# Patient Record
Sex: Male | Born: 1935 | Race: White | Hispanic: No | Marital: Married
Health system: Southern US, Community
[De-identification: ages and names within clinical notes are randomized; demographics above are authoritative.]

## PROBLEM LIST (undated history)

## (undated) DIAGNOSIS — N529 Male erectile dysfunction, unspecified: Secondary | ICD-10-CM

## (undated) DIAGNOSIS — K219 Gastro-esophageal reflux disease without esophagitis: Secondary | ICD-10-CM

## (undated) DIAGNOSIS — IMO0001 Reserved for inherently not codable concepts without codable children: Secondary | ICD-10-CM

## (undated) DIAGNOSIS — H409 Unspecified glaucoma: Secondary | ICD-10-CM

## (undated) DIAGNOSIS — R002 Palpitations: Secondary | ICD-10-CM

## (undated) DIAGNOSIS — R5383 Other fatigue: Secondary | ICD-10-CM

## (undated) DIAGNOSIS — I493 Ventricular premature depolarization: Secondary | ICD-10-CM

## (undated) DIAGNOSIS — J189 Pneumonia, unspecified organism: Secondary | ICD-10-CM

## (undated) DIAGNOSIS — R0789 Other chest pain: Secondary | ICD-10-CM

## (undated) DIAGNOSIS — R1310 Dysphagia, unspecified: Secondary | ICD-10-CM

## (undated) DIAGNOSIS — C07 Malignant neoplasm of parotid gland: Secondary | ICD-10-CM

## (undated) DIAGNOSIS — R152 Fecal urgency: Secondary | ICD-10-CM

## (undated) HISTORY — DX: Gastro-esophageal reflux disease without esophagitis: K21.9

## (undated) HISTORY — DX: Fecal urgency: R15.2

## (undated) HISTORY — DX: Male erectile dysfunction, unspecified: N52.9

## (undated) HISTORY — DX: Palpitations: R00.2

## (undated) HISTORY — DX: Other fatigue: R53.83

## (undated) HISTORY — PX: CARDIOVASCULAR STRESS TEST: SHX262

## (undated) HISTORY — DX: Other chest pain: R07.89

## (undated) HISTORY — DX: Ventricular premature depolarization: I49.3

## (undated) HISTORY — DX: Reserved for inherently not codable concepts without codable children: IMO0001

## (undated) HISTORY — PX: GLAUCOMA SURGERY: SHX656

---
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
## 1986-12-31 DIAGNOSIS — C07 Malignant neoplasm of parotid gland: Secondary | ICD-10-CM

## 1986-12-31 HISTORY — DX: Malignant neoplasm of parotid gland: C07

## 1986-12-31 HISTORY — PX: PAROTID GLAND TUMOR EXCISION: SHX5221

## 1997-12-31 HISTORY — PX: INGUINAL HERNIA REPAIR: SUR1180

## 1998-01-24 ENCOUNTER — Encounter: Payer: Self-pay | Admitting: Internal Medicine

## 2000-05-26 ENCOUNTER — Encounter: Payer: Self-pay | Admitting: Emergency Medicine

## 2000-05-26 ENCOUNTER — Emergency Department (HOSPITAL_COMMUNITY): Admission: EM | Admit: 2000-05-26 | Discharge: 2000-05-26 | Payer: Self-pay | Admitting: *Deleted

## 2000-05-26 ENCOUNTER — Encounter (INDEPENDENT_AMBULATORY_CARE_PROVIDER_SITE_OTHER): Payer: Self-pay | Admitting: *Deleted

## 2005-05-16 ENCOUNTER — Encounter: Admission: RE | Admit: 2005-05-16 | Discharge: 2005-05-16 | Payer: Self-pay | Admitting: Otolaryngology

## 2005-06-19 ENCOUNTER — Ambulatory Visit: Payer: Self-pay | Admitting: Internal Medicine

## 2005-06-28 ENCOUNTER — Encounter (INDEPENDENT_AMBULATORY_CARE_PROVIDER_SITE_OTHER): Payer: Self-pay | Admitting: Specialist

## 2005-06-28 ENCOUNTER — Ambulatory Visit: Payer: Self-pay | Admitting: Internal Medicine

## 2008-07-13 DIAGNOSIS — K649 Unspecified hemorrhoids: Secondary | ICD-10-CM | POA: Insufficient documentation

## 2008-07-13 DIAGNOSIS — D126 Benign neoplasm of colon, unspecified: Secondary | ICD-10-CM

## 2008-07-13 DIAGNOSIS — K5730 Diverticulosis of large intestine without perforation or abscess without bleeding: Secondary | ICD-10-CM | POA: Insufficient documentation

## 2008-07-14 ENCOUNTER — Ambulatory Visit: Payer: Self-pay | Admitting: Internal Medicine

## 2008-07-14 DIAGNOSIS — R1314 Dysphagia, pharyngoesophageal phase: Secondary | ICD-10-CM

## 2008-07-14 DIAGNOSIS — K219 Gastro-esophageal reflux disease without esophagitis: Secondary | ICD-10-CM

## 2008-07-14 DIAGNOSIS — R197 Diarrhea, unspecified: Secondary | ICD-10-CM | POA: Insufficient documentation

## 2008-08-05 ENCOUNTER — Telehealth: Payer: Self-pay | Admitting: Internal Medicine

## 2008-08-10 ENCOUNTER — Encounter: Payer: Self-pay | Admitting: Internal Medicine

## 2008-08-10 ENCOUNTER — Ambulatory Visit: Payer: Self-pay | Admitting: Internal Medicine

## 2008-08-13 ENCOUNTER — Encounter: Payer: Self-pay | Admitting: Internal Medicine

## 2008-09-02 ENCOUNTER — Ambulatory Visit: Payer: Self-pay | Admitting: Internal Medicine

## 2009-09-08 ENCOUNTER — Telehealth: Payer: Self-pay | Admitting: Internal Medicine

## 2009-09-26 ENCOUNTER — Ambulatory Visit: Payer: Self-pay | Admitting: Internal Medicine

## 2009-09-26 DIAGNOSIS — K644 Residual hemorrhoidal skin tags: Secondary | ICD-10-CM | POA: Insufficient documentation

## 2009-09-26 DIAGNOSIS — Z860100 Personal history of colon polyps, unspecified: Secondary | ICD-10-CM | POA: Insufficient documentation

## 2009-09-26 DIAGNOSIS — R159 Full incontinence of feces: Secondary | ICD-10-CM | POA: Insufficient documentation

## 2009-09-27 ENCOUNTER — Telehealth: Payer: Self-pay | Admitting: Internal Medicine

## 2010-01-20 HISTORY — PX: TRANSTHORACIC ECHOCARDIOGRAM: SHX275

## 2010-11-15 ENCOUNTER — Ambulatory Visit: Payer: Self-pay | Admitting: Cardiology

## 2011-02-21 ENCOUNTER — Ambulatory Visit (INDEPENDENT_AMBULATORY_CARE_PROVIDER_SITE_OTHER): Payer: Self-pay | Admitting: Cardiology

## 2011-02-21 DIAGNOSIS — K3189 Other diseases of stomach and duodenum: Secondary | ICD-10-CM

## 2011-02-21 DIAGNOSIS — I4949 Other premature depolarization: Secondary | ICD-10-CM

## 2011-02-21 DIAGNOSIS — R634 Abnormal weight loss: Secondary | ICD-10-CM

## 2011-05-09 ENCOUNTER — Telehealth: Payer: Self-pay | Admitting: Cardiology

## 2011-05-09 NOTE — Telephone Encounter (Signed)
Patient came into office with statement from 02/21/11, where insurance was not filed.  He had called to patient accounting and asked them to file insurance and was told they could not do this.  I went into [employer] and updated insurance and reposted.  Called to patient account and verified that patient insurance was updated.

## 2011-06-11 ENCOUNTER — Other Ambulatory Visit: Payer: Self-pay | Admitting: *Deleted

## 2011-06-11 DIAGNOSIS — I493 Ventricular premature depolarization: Secondary | ICD-10-CM

## 2011-06-11 MED ORDER — NEBIVOLOL HCL 10 MG PO TABS: 10.0000 mg | ORAL_TABLET | Freq: Every day | ORAL | Status: DC

## 2011-06-11 NOTE — Telephone Encounter (Signed)
Refilled meds per fax request.  

## 2011-06-20 ENCOUNTER — Encounter: Payer: Self-pay | Admitting: Cardiology

## 2011-06-25 ENCOUNTER — Encounter: Payer: Self-pay | Admitting: Cardiology

## 2011-06-25 ENCOUNTER — Ambulatory Visit (INDEPENDENT_AMBULATORY_CARE_PROVIDER_SITE_OTHER): Payer: Medicare Other | Admitting: Cardiology

## 2011-06-25 VITALS — BP 130/78 | HR 76 | Wt 167.0 lb

## 2011-06-25 DIAGNOSIS — I493 Ventricular premature depolarization: Secondary | ICD-10-CM | POA: Insufficient documentation

## 2011-06-25 DIAGNOSIS — I119 Hypertensive heart disease without heart failure: Secondary | ICD-10-CM

## 2011-06-25 DIAGNOSIS — I4949 Other premature depolarization: Secondary | ICD-10-CM

## 2011-06-25 NOTE — Progress Notes (Signed)
Ryan Lynn Date of Birth:  Sep 28, 1934 [facility] / [facility] [address]   [address] [place], [place]  [address] [phone]           Fax   [phone]  History of Present Illness: This pleasant 77 year old gentleman is seen for a scheduled 4 month followup office visit.  He has a history of palpitations and chest pressure.  He has had palpitations dating back 15 or 20 years.  We first saw him for this in February 2011.  He does not have any evidence of ischemic heart disease.  He had a normal treadmill Cardiolite stress test on 02/15/10 and there is no evidence of ischemia and his PVCs decreased with exercise.  He initially was given a trial of metoprolol which she did not improve on and eventually was switched to bystolic which she has tolerated very well.  The patient has a history of having had an echocardiogram on 01/20/10 which showed normal LV systolic function with an ejection fraction of 55-60%, impaired relaxation, and no significant valve abnormalities.  Pulmonary artery pressure was normal.  Current Outpatient Prescriptions  Medication Sig Dispense Refill  . calcium carbonate (TUMS - DOSED IN MG ELEMENTAL CALCIUM) 500 MG chewable tablet Chew 2 tablets by mouth daily.        . Cetirizine HCl (ZYRTEC PO) Take by mouth as needed.        . Cholecalciferol (VITAMIN D) 2000 UNITS tablet Take 2,000 Units by mouth daily.        . dorzolamide-timolol (COSOPT) 22.3-6.8 MG/ML ophthalmic solution 1 drop as directed.        . nebivolol (BYSTOLIC) 10 MG tablet Take 1 tablet (10 mg total) by mouth daily.  30 tablet  11  . travoprost, benzalkonium, (TRAVATAN) 0.004 % ophthalmic solution 1 drop as directed.        . vitamin C (ASCORBIC ACID) 500 MG tablet Take 500 mg by mouth daily.          Allergies  Allergen Reactions  . Nexium Diarrhea    Patient Active Problem List  Diagnoses  . COLONIC POLYPS  . HEMORRHOIDS-EXTERNAL  . HEMORRHOIDS  . GERD  . DIVERTICULOSIS, COLON   . DYSPHAGIA  . FECAL INCONTINENCE  . DIARRHEA  . PERSONAL HX COLONIC POLYPS  . PVC's (premature ventricular contractions)  . Benign hypertensive heart disease without heart failure    History  Smoking status  . Never Smoker   Smokeless tobacco  . Not on file    History  Alcohol Use No    Family History  Problem Relation Age of Onset  . Heart failure Mother   . Cancer Father     Review of Systems: Constitutional: no fever chills diaphoresis or fatigue or change in weight.  Head and neck: no hearing loss, no epistaxis, no photophobia or visual disturbance. Respiratory: No cough, shortness of breath or wheezing. Cardiovascular: No chest pain peripheral edema, palpitations. Gastrointestinal: No abdominal distention, no abdominal pain, no change in bowel habits hematochezia or melena.The patient does have a history of occasional Dysentery.  He is followed by Dr. Marina Goodell. Genitourinary: No dysuria, no frequency, no urgency, no nocturia. Musculoskeletal:No arthralgias, no back pain, no gait disturbance or myalgias. Neurological: No dizziness, no headaches, no numbness, no seizures, no syncope, no weakness, no tremors. Hematologic: No lymphadenopathy, no easy bruising. Psychiatric: No confusion, no hallucinations, no sleep disturbance.    Physical Exam: Filed Vitals:   06/25/11 1353  BP: 130/78  Pulse:  76  The general examination reveals a well-developed well-nourished gentleman in no distress.The head and neck exam reveals pupils equal and reactive.  Extraocular movements are full.  There is no scleral icterus.  The mouth and pharynx are normal.  The neck is supple.  The carotids reveal no bruits.  The jugular venous pressure is normal.  The  thyroid is not enlarged.  There is no lymphadenopathy.  The chest is clear to percussion and auscultation.  There are no rales or rhonchi.  Expansion of the chest is symmetrical.  The precordium is quiet.The patient is having occasional  PVCs.  The first heart sound is normal.  The second heart sound is physiologically split.  There is no murmur gallop rub or click.  There is no abnormal lift or heave.  The abdomen is soft and nontender.  The bowel sounds are normal.  The liver and spleen are not enlarged.  There are no abdominal masses.  There are no abdominal bruits.  Extremities reveal good pedal pulses.  There is no phlebitis or edema.  There is no cyanosis or clubbing.  Strength is normal and symmetrical in all extremities.  There is no lateralizing weakness.  There are no sensory deficits.  The skin is warm and dry.  There is no rash.  EKG today shows normal sinus rhythm with occasional unifocal PVCs.  The tracing is otherwise within normal limits.   Assessment / Plan: The patient is to continue his same medication.  Continue regular exercise.  Recheck in 6 months for followup office visit and EKG

## 2011-06-25 NOTE — Assessment & Plan Note (Signed)
This pleasant gentleman has a long history of palpitations and PVCs.  He has had a good response to Bystolic10 mg a day.  Although he is still having isolated PVCs, he is no longer bothered by the sensation of palpitations

## 2011-06-25 NOTE — Assessment & Plan Note (Signed)
The patient has a history of labile hypertension.  When we last saw him in February his blood pressure was running 144/90.  Since then he has been checking his pressures at home and they have been down in normal range.  He brought in an extensive list of his home blood pressures today dating from March 05, 2011 until the present.  He has not had any exertional chest pain or shortness of breath or dizziness or syncope.  His energy level is good

## 2011-06-26 ENCOUNTER — Encounter: Payer: Self-pay | Admitting: Cardiology

## 2011-06-27 ENCOUNTER — Other Ambulatory Visit: Payer: Self-pay | Admitting: Dermatology

## 2011-11-26 ENCOUNTER — Telehealth: Payer: Self-pay | Admitting: Internal Medicine

## 2011-11-26 DIAGNOSIS — R197 Diarrhea, unspecified: Secondary | ICD-10-CM

## 2011-11-26 NOTE — Telephone Encounter (Signed)
Pt aware. Will call [facility] in the am to see if rx can be prepared cheaper for pt. Call pt on cell at [phone].

## 2011-11-26 NOTE — Telephone Encounter (Signed)
If patient had previously been diagnosed with C. Diff, then start Vanco 250mg  qid x 4 weeks. Have him see me in the office in about 3-4 weeks

## 2011-11-26 NOTE — Telephone Encounter (Signed)
Last OV with Dr. Marina Goodell 09-26-09 pt states he was having problems with urgency and unpredictability with his bowels.EGD and Colon 08-10-08. Pt had a gum infection in July and was placed on Clindamycin. August 23rd he started having bad diarrhea and was diagnosed with cdiff. Pt was placed on 1st round of Flagyl August 27th by Dr. Timothy Lasso. Pt had his second round of Flagyl 09/14/11. Pt reports that the end of September he was having the problems with unpredictability and urgency again. Pt states this month he started having a lot of diarrhea again and he was placed on Vancomycin. Pt finished the vanc this weekend. He is having diarrhea again, called Dr. Timothy Lasso and he told the pt to call Dr. Lamar Sprinkles office. Pt states he has not been tested again this time for Cdiff. Dr. Marina Goodell please advise.

## 2011-11-27 NOTE — Telephone Encounter (Signed)
YES ASAP, AND PROCEED WITH TREATMENT IF +.  LINDA PLEASE FOLLOW UP ON THIS

## 2011-11-27 NOTE — Telephone Encounter (Signed)
Patient aware and will come today to the lab.

## 2011-11-27 NOTE — Telephone Encounter (Signed)
Pt called back this am and wanted to let us know he was only tested for cdiff in August. He has not be tested again. Pt would like to be tested again before starting the vanc again. Dr. Marina Goodell is it ok to bring him in for a stool specimen for d cdiff?

## 2011-11-28 ENCOUNTER — Other Ambulatory Visit: Payer: Medicare Other

## 2011-11-28 DIAGNOSIS — R197 Diarrhea, unspecified: Secondary | ICD-10-CM

## 2011-11-29 ENCOUNTER — Telehealth: Payer: Self-pay

## 2011-11-29 LAB — CLOSTRIDIUM DIFFICILE BY PCR: Toxigenic C. Difficile by PCR: NOT DETECTED

## 2011-11-29 NOTE — Telephone Encounter (Signed)
Message copied by HUNT, LINDA R. R on Thu Nov 29, 2011 10:52 AM ------      Message from: PERRY, JOHN N      Created: Thu Nov 29, 2011 10:16 AM       LET PT KNOW C. DIFF IS NEGATIVE. RECOMMEND FLORASTOR ONE BID X 2 WEEKS, LOW DOSE (1-4 DAILY) IMMODIUM DAILY AS NEEDED, AND CALL US IN ONE WEEK WITH UPDATE. 

## 2011-11-29 NOTE — Telephone Encounter (Signed)
Spoke with pt and he is aware. He will call next week with update.

## 2011-11-29 NOTE — Telephone Encounter (Signed)
I SENT YOU NOTE. THANKS

## 2011-11-29 NOTE — Telephone Encounter (Signed)
Pts cdiff test was negative. Should pt just be scheduled for OV for sense of urgency and unpredictability with bowel movements? Please advise.

## 2011-11-29 NOTE — Telephone Encounter (Signed)
Message copied by Michele Mcalpine on Thu Nov 29, 2011 10:52 AM ------      Message from: Hilarie Fredrickson      Created: Thu Nov 29, 2011 10:16 AM       LET PT KNOW C. DIFF IS NEGATIVE. RECOMMEND FLORASTOR ONE BID X 2 WEEKS, LOW DOSE (1-4 DAILY) IMMODIUM DAILY AS NEEDED, AND CALL us IN ONE WEEK WITH UPDATE.

## 2011-11-29 NOTE — Telephone Encounter (Signed)
Thanks

## 2011-12-06 ENCOUNTER — Telehealth: Payer: Self-pay | Admitting: Internal Medicine

## 2011-12-06 NOTE — Telephone Encounter (Signed)
At this point, he should just make a routine office appointment if he is having ongoing questions or problems

## 2011-12-06 NOTE — Telephone Encounter (Signed)
Mr. Garofano is calling back to update [place]. He has been taking the Florastor and has only taken 1 Immodium daily for 4 total doses. He states he has had no more diarrhea but his stools are still quite soft. He states he is still having the feeling of pressure at times like he needs to go to the bathroom but there is nothing there. Pt wonders if he should try more that one of the Immodium to see if the stool will become more firm. Dr. Marina Goodell please advise.

## 2011-12-06 NOTE — Telephone Encounter (Signed)
Pt aware and appt made for pt to see Dr. Marina Goodell 12/10/11@3 :45pm. Pt aware of appt date and time.

## 2011-12-10 ENCOUNTER — Ambulatory Visit (INDEPENDENT_AMBULATORY_CARE_PROVIDER_SITE_OTHER): Payer: Medicare Other | Admitting: Internal Medicine

## 2011-12-10 ENCOUNTER — Encounter: Payer: Self-pay | Admitting: Internal Medicine

## 2011-12-10 VITALS — BP 158/84 | HR 76 | Ht 73.0 in | Wt 166.0 lb

## 2011-12-10 DIAGNOSIS — A0472 Enterocolitis due to Clostridium difficile, not specified as recurrent: Secondary | ICD-10-CM

## 2011-12-10 DIAGNOSIS — Z860100 Personal history of colon polyps, unspecified: Secondary | ICD-10-CM

## 2011-12-10 DIAGNOSIS — K219 Gastro-esophageal reflux disease without esophagitis: Secondary | ICD-10-CM

## 2011-12-10 DIAGNOSIS — R159 Full incontinence of feces: Secondary | ICD-10-CM

## 2011-12-10 DIAGNOSIS — K589 Irritable bowel syndrome without diarrhea: Secondary | ICD-10-CM

## 2011-12-10 MED ORDER — CILIDINIUM-CHLORDIAZEPOXIDE 2.5-5 MG PO CAPS: ORAL_CAPSULE | ORAL | Status: DC

## 2011-12-10 NOTE — Patient Instructions (Signed)
We have sent the following medications to your pharmacy for you to pick up at your convenience:  Librax  

## 2011-12-10 NOTE — Progress Notes (Signed)
HISTORY OF PRESENT ILLNESS:  Ryan Lynn is a 77 y.o. 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 He was last evaluated in the office September 2010 regarding urgent bowel movements and hemorrhoidal symptoms. See that dictation. He was treated with medicated suppositories, sitz baths, and fiber. As well Librax. He tells me that Librax helped his abdominal complaints. He was able to wean off the medication. He was doing well until late August 2000 1220 developed a significant diarrheal illness after receiving clindamycin for dental work. He was diagnosed with Clostridium difficile associated diarrhea. He was treated with a course of metronidazole. Due to persisting symptoms he was treated with a second course of metronidazole. Eventually, in November, a course of vancomycin for recurrent symptoms. He finished the medication 2 weeks ago. Since this problem he has had some urgency. We did recheck Clostridium difficile for PCR which was negative. He contacted our office at the request of his primary provider. We placed him on Florastor for ongoing complaints of urgency, gas, and fecal seepage. His last colonoscopy was in August of 2009. This revealed diverticulosis. Otherwise normal examination including the ileum and random colon biopsies. Upper endoscopy at that time was negative except for an esophageal ring, fundic gland polyps, and a hiatal hernia.  REVIEW OF SYSTEMS:  All non-GI ROS negative.  Past Medical History  Diagnosis Date  . Hypertension   . PVC's (premature ventricular contractions)   . Hiatal hernia   . Rectal urgency   . Chest pressure   . Heart palpitations   . Aortic sclerosis   . ED (erectile dysfunction)   . Fatigue     Past Surgical History  Procedure Date  . Transthoracic echocardiogram 01/20/2010    NORMAL. EF 55-60%  .  Cardiovascular stress test     NO ISCHEMIA    Social History Ryan Lynn  reports that he has never smoked. He has never used smokeless tobacco. He reports that he does not drink alcohol or use illicit drugs.  family history includes Cancer in his father and Heart failure in his mother.  Allergies  Allergen Reactions  . Clindamycin/Lincomycin   . Nexium Diarrhea       PHYSICAL EXAMINATION:  Vital signs: BP 158/84  Pulse 76  Ht 6\' 1"  (1.854 m)  Wt 166 lb (75.297 kg)  BMI 21.90 kg/m2 General: Well-developed, well-nourished, no acute distress HEENT: Sclerae are anicteric, conjunctiva pink. Oral mucosa intact Lungs: Clear Heart: Regular Abdomen: soft, nontender, nondistended, no obvious ascites, no peritoneal signs, normal bowel sounds. No organomegaly. Extremities: No edema Psychiatric: alert and oriented x3. Cooperative     ASSESSMENT:  #1. Clostridium difficile associated diarrhea. Treated with 2 courses of metronidazole and one course of vancomycin. Seems to be asymptomatic with negative PCR testing for Clostridium difficile. Now on Florastor is recommended. #2. GERD. Asymptomatic off of PPIs #3. History of adenomatous colon polyps #4. Irritable bowel type picture with Fecal incontinence. Decreased rectal tone on physical exam.   PLAN:  #1. Continue Florastor #2. Librax one before meals and at night when necessary #3. Imodium when necessary #4. Local measures for hemorrhoids as previously reviewed #5. Surveillance colonoscopy around 2014 #6. GI followup when necessary

## 2011-12-26 ENCOUNTER — Encounter: Payer: Self-pay | Admitting: Cardiology

## 2011-12-26 ENCOUNTER — Ambulatory Visit (INDEPENDENT_AMBULATORY_CARE_PROVIDER_SITE_OTHER): Payer: Medicare Other | Admitting: Cardiology

## 2011-12-26 VITALS — BP 140/88 | HR 66 | Ht 73.0 in | Wt 165.8 lb

## 2011-12-26 DIAGNOSIS — I119 Hypertensive heart disease without heart failure: Secondary | ICD-10-CM

## 2011-12-26 DIAGNOSIS — R5383 Other fatigue: Secondary | ICD-10-CM

## 2011-12-26 DIAGNOSIS — I4949 Other premature depolarization: Secondary | ICD-10-CM

## 2011-12-26 DIAGNOSIS — I493 Ventricular premature depolarization: Secondary | ICD-10-CM

## 2011-12-26 NOTE — Patient Instructions (Signed)
Your physician wants you to follow-up in: 6 months  You will receive a reminder letter in the mail two months in advance. If you don't receive a letter, please call our office to schedule the follow-up appointment.  Your physician recommends that you continue on your current medications as directed. Please refer to the Current Medication list given to you today.  

## 2011-12-26 NOTE — Assessment & Plan Note (Signed)
The patient complains of some malaise and fatigue which he attributes to the medication.  He feels cold.  We will try cutting back on the bystolic to just half a tablet a day temporarily and see if symptoms improve.

## 2011-12-26 NOTE — Assessment & Plan Note (Signed)
Normally the patient exercises on a regular basis.  However over the past several months he has had problems with a infected tooth which led to antibiotic associated enterocolitis with C. difficile which was difficult to eradicate.  During that period of several months he got very little exercise.

## 2011-12-26 NOTE — Assessment & Plan Note (Signed)
The patient has not been as aware of his PVCs recently.  He is still having numbness shown by his rhythm strip to day.  He is not having any angina pectoris.  He said no TIA symptoms.

## 2011-12-26 NOTE — Progress Notes (Signed)
Ryan Lynn Date of Birth:  [birth year] [facility] / [facility] [address]   [address] [place], [place]  [address] [phone]           Fax   [phone]  History of Present Illness: This pleasant 77 year old gentleman is seen for followup office visit.  He has a past history of essential hypertension chest pressure and palpitations.  His palpitations dates back 15 or 20 years.  We first saw him in February 2011.  He does not have any evidence for ischemic heart disease.  He had a normal treadmill Cardiolite stress test on 02/15/10.  His PVCs which were numerous at rest decreased with exercise.  He was given an initial trial of metoprolol which he did not tolerate well and eventually was switched to bystolic when she has tolerated better.  However he does complain that it makes him feel current.  He presently is taking 10 mg daily.  Current Outpatient Prescriptions  Medication Sig Dispense Refill  . calcium carbonate (TUMS - DOSED IN MG ELEMENTAL CALCIUM) 500 MG chewable tablet Chew 2 tablets by mouth as needed.       . Cetirizine HCl (ZYRTEC PO) Take by mouth as needed.        . Cholecalciferol (VITAMIN D) 2000 UNITS tablet Take 4,000 Units by mouth daily.       . clidinium-chlordiazePOXIDE (LIBRAX) 2.5-5 MG per capsule Take one tablet before breakfast and at bedtime as needed  120 capsule  2  . dorzolamide-timolol (COSOPT) 22.3-6.8 MG/ML ophthalmic solution 1 drop as directed.        . hydrocortisone (ANUSOL-HC) 25 MG suppository Place 25 mg rectally 2 (two) times daily as needed.        . loperamide (IMODIUM) 2 MG capsule Take 2 mg by mouth 4 (four) times daily as needed.        . nebivolol (BYSTOLIC) 10 MG tablet Take 1 tablet (10 mg total) by mouth daily.  30 tablet  11  . simethicone (MYLICON) 125 MG chewable tablet Chew 125 mg by mouth every 6 (six) hours as needed.        . travoprost, benzalkonium, (TRAVATAN) 0.004 % ophthalmic solution 1 drop as directed.        .  vitamin C (ASCORBIC ACID) 500 MG tablet Take 1,000 mg by mouth daily.         Allergies  Allergen Reactions  . Clindamycin/Lincomycin   . Nexium Diarrhea    Patient Active Problem List  Diagnoses  . COLONIC POLYPS  . HEMORRHOIDS-EXTERNAL  . HEMORRHOIDS  . GERD  . DIVERTICULOSIS, COLON  . DYSPHAGIA  . FECAL INCONTINENCE  . DIARRHEA  . PERSONAL HX COLONIC POLYPS  . PVC's (premature ventricular contractions)  . Benign hypertensive heart disease without heart failure    History  Smoking status  . Never Smoker   Smokeless tobacco  . Never Used    History  Alcohol Use No    Family History  Problem Relation Age of Onset  . Heart failure Mother   . Cancer Father     Review of Systems: Constitutional: no fever chills diaphoresis or fatigue or change in weight.  Head and neck: no hearing loss, no epistaxis, no photophobia or visual disturbance. Respiratory: No cough, shortness of breath or wheezing. Cardiovascular: No chest pain peripheral edema, palpitations. Gastrointestinal: No abdominal distention, no abdominal pain, no change in bowel habits hematochezia or melena. Genitourinary: No dysuria, no frequency, no urgency, no nocturia. Musculoskeletal:No  arthralgias, no back pain, no gait disturbance or myalgias. Neurological: No dizziness, no headaches, no numbness, no seizures, no syncope, no weakness, no tremors. Hematologic: No lymphadenopathy, no easy bruising. Psychiatric: No confusion, no hallucinations, no sleep disturbance.    Physical Exam: Filed Vitals:   12/26/11 1323  BP: 140/88  Pulse: 66   the general appearance reveals a well-developed well-nourished gentleman in no distress.The head and neck exam reveals pupils equal and reactive.  Extraocular movements are full.  There is no scleral icterus.  The mouth and pharynx are normal.  The neck is supple.  The carotids reveal no bruits.  The jugular venous pressure is normal.  The  thyroid is not enlarged.   There is no lymphadenopathy.  The chest is clear to percussion and auscultation.  There are no rales or rhonchi.  Expansion of the chest is symmetrical.  The precordium is quiet.  The first heart sound is normal.  The second heart sound is physiologically split.  There is no murmur gallop rub or click.  There is no abnormal lift or heave.  The abdomen is soft and nontender.  The bowel sounds are normal.  The liver and spleen are not enlarged.  There are no abdominal masses.  There are no abdominal bruits.  Extremities reveal good pedal pulses.  There is no phlebitis or edema.  There is no cyanosis or clubbing.  Strength is normal and symmetrical in all extremities.  There is no lateralizing weakness.  There are no sensory deficits.  The skin is warm and dry.  There is no rash.  EKG shows normal sinus rhythm with occasional unifocal PVC   Assessment / Plan: His blood pressure at home is significantly lower than it is in the office consistent with whitecoat syndrome.  We will try cutting back on his Bystolic to just half a tablet a day and see if we can get by with the lower dose.  If not he will resume his previous dose of 10 mg daily.  Recheck in 6 months for followup office visit and EKG.

## 2012-01-02 ENCOUNTER — Other Ambulatory Visit: Payer: Self-pay | Admitting: Dermatology

## 2012-02-26 ENCOUNTER — Other Ambulatory Visit: Payer: Self-pay | Admitting: Dermatology

## 2012-05-29 ENCOUNTER — Other Ambulatory Visit: Payer: Self-pay | Admitting: Dermatology

## 2012-07-23 ENCOUNTER — Encounter: Payer: Self-pay | Admitting: Cardiology

## 2012-07-23 ENCOUNTER — Ambulatory Visit (INDEPENDENT_AMBULATORY_CARE_PROVIDER_SITE_OTHER): Payer: Medicare Other | Admitting: Cardiology

## 2012-07-23 VITALS — BP 130/80 | HR 66 | Ht 73.0 in | Wt 161.8 lb

## 2012-07-23 DIAGNOSIS — I493 Ventricular premature depolarization: Secondary | ICD-10-CM

## 2012-07-23 DIAGNOSIS — I119 Hypertensive heart disease without heart failure: Secondary | ICD-10-CM

## 2012-07-23 DIAGNOSIS — I4949 Other premature depolarization: Secondary | ICD-10-CM

## 2012-07-23 DIAGNOSIS — K117 Disturbances of salivary secretion: Secondary | ICD-10-CM | POA: Insufficient documentation

## 2012-07-23 MED ORDER — NEBIVOLOL HCL 10 MG PO TABS: ORAL_TABLET | ORAL | Status: DC

## 2012-07-23 NOTE — Assessment & Plan Note (Signed)
His PVCs have responded to treatment with Bystolic.  10 mg caused too much malaise and fatigue but he is doing better on 5 mg daily.

## 2012-07-23 NOTE — Patient Instructions (Addendum)
Your physician recommends that you continue on your current medications as directed. Please refer to the Current Medication list given to you today.  Your physician wants you to follow-up in: 6 months. You will receive a reminder letter in the mail two months in advance. If you don't receive a letter, please call our office to schedule the follow-up appointment.  

## 2012-07-23 NOTE — Progress Notes (Signed)
Ryan Lynn Date of Birth:  22-Sep-1934 [facility] [address] [place], [place]  [address] [phone]         Fax   [phone]  History of Present Illness: This pleasant 78 year old gentleman is seen for a six-month followup office visit.  The patient has a past history of symptomatic palpitations.  He's also had a past history of atypical chest pain and a history of essential hypertension.  He has had palpitations for about 20 years.  We first saw him in February 2011.  He had a normal treadmill Cardiolite stress test in February 2011 and he does not have any evidence of ischemic heart disease.  He has PVCs were initially difficult to control.  Initially we tried metoprolol which did not tolerate and we switched him to [facility] which has been more effective for him.  Other problems that he has includes xerostomia and lack of adequate saliva and he also has been diagnosed with vocal cord atrophy causing hoarseness.  Dr. Narda Bonds is is ENT physician.  Current Outpatient Prescriptions  Medication Sig Dispense Refill  . calcium carbonate (TUMS - DOSED IN MG ELEMENTAL CALCIUM) 500 MG chewable tablet Chew 2 tablets by mouth as needed.       . Cetirizine HCl (ZYRTEC PO) Take by mouth as needed.        . Cholecalciferol (VITAMIN D) 2000 UNITS tablet Take 4,000 Units by mouth daily.       . clidinium-chlordiazePOXIDE (LIBRAX) 2.5-5 MG per capsule Take one tablet before breakfast and at bedtime as needed  120 capsule  2  . dorzolamide-timolol (COSOPT) 22.3-6.8 MG/ML ophthalmic solution Place 1 drop into both eyes 2 (two) times daily.      Marland Kitchen loperamide (IMODIUM) 2 MG capsule Take 2 mg by mouth 4 (four) times daily as needed.        . simethicone (MYLICON) 125 MG chewable tablet Chew 125 mg by mouth every 6 (six) hours as needed.        . travoprost, benzalkonium, (TRAVATAN) 0.004 % ophthalmic solution 1 drop as directed.        . vitamin C (ASCORBIC ACID) 500 MG tablet Take  1,000 mg by mouth daily.       . dorzolamide (TRUSOPT) 2 % ophthalmic solution as directed.      . nebivolol (BYSTOLIC) 10 MG tablet Take 10 mg by mouth as directed. 1/2 tablet daily      . nebivolol (BYSTOLIC) 10 MG tablet 1/2 daily or as directed  60 tablet  5  . DISCONTD: nebivolol (BYSTOLIC) 10 MG tablet Take 1 tablet (10 mg total) by mouth daily.  30 tablet  11    Allergies  Allergen Reactions  . Clindamycin/Lincomycin   . Esomeprazole Magnesium Diarrhea    Patient Active Problem List  Diagnosis  . COLONIC POLYPS  . HEMORRHOIDS-EXTERNAL  . HEMORRHOIDS  . GERD  . DIVERTICULOSIS, COLON  . DYSPHAGIA  . FECAL INCONTINENCE  . DIARRHEA  . PERSONAL HX COLONIC POLYPS  . PVC's (premature ventricular contractions)  . Benign hypertensive heart disease without heart failure  . Fatigue    History  Smoking status  . Never Smoker   Smokeless tobacco  . Never Used    History  Alcohol Use No    Family History  Problem Relation Age of Onset  . Heart failure Mother   . Cancer Father     Review of Systems: Constitutional: no fever chills diaphoresis or fatigue or change  in weight.  Head and neck: no hearing loss, no epistaxis, no photophobia or visual disturbance. Respiratory: No cough, shortness of breath or wheezing. Cardiovascular: No chest pain peripheral edema, palpitations. Gastrointestinal: No abdominal distention, no abdominal pain, no change in bowel habits hematochezia or melena. Genitourinary: No dysuria, no frequency, no urgency, no nocturia. Musculoskeletal:No arthralgias, no back pain, no gait disturbance or myalgias. Neurological: No dizziness, no headaches, no numbness, no seizures, no syncope, no weakness, no tremors. Hematologic: No lymphadenopathy, no easy bruising. Psychiatric: No confusion, no hallucinations, no sleep disturbance.    Physical Exam: Filed Vitals:   07/23/12 1026  BP: 130/80  Pulse: 66   the general appearance reveals a  well-developed somewhat anxious elderly gentleman in no distress.The head and neck exam reveals pupils equal and reactive.  Extraocular movements are full.  There is no scleral icterus.  The mouth and pharynx are normal.  The neck is supple.  The carotids reveal no bruits.  The jugular venous pressure is normal.  The  thyroid is not enlarged.  There is no lymphadenopathy.  The chest is clear to percussion and auscultation.  There are no rales or rhonchi.  Expansion of the chest is symmetrical.  The precordium is quiet.  The first heart sound is normal.  The second heart sound is physiologically split.  There is no murmur gallop rub or click.  There is no abnormal lift or heave.  The abdomen is soft and nontender.  The bowel sounds are normal.  The liver and spleen are not enlarged.  There are no abdominal masses.  There are no abdominal bruits.  Extremities reveal good pedal pulses.  There is no phlebitis or edema.  There is no cyanosis or clubbing.  Strength is normal and symmetrical in all extremities.  There is no lateralizing weakness.  There are no sensory deficits.  The skin is warm and dry.  There is no rash.  EKG today shows normal sinus rhythm and no PVCs and is within normal limits.   Assessment / Plan: Continue present medication.  Continue regular exercise.  Use extra dietary salt if blood pressure gets too low and he is symptomatic.  Recheck 6 months for followup office visit and EKG

## 2012-07-23 NOTE — Assessment & Plan Note (Signed)
The patient has xerostomia.  Dr. Ezzard Standing had suggested that he try Evoxac.  However the medication has a lot of cardiovascular side effects including interaction with beta blockers causing AV block and the patient did not take the new medication.  I told him that he was right in not taking the medication because of the potential for side effects

## 2012-07-23 NOTE — Assessment & Plan Note (Signed)
He brought in a list of his home blood pressures.  Generally they are normal but occasionally they are low.  If his systolic pressure drops below 409 he feels tired and weak.  He talked about the fact that on the days that his blood pressure is low he can eat a little more dietary salt to raise his blood pressure back to the normal range

## 2012-07-25 ENCOUNTER — Telehealth: Payer: Self-pay | Admitting: Cardiology

## 2012-07-25 DIAGNOSIS — I493 Ventricular premature depolarization: Secondary | ICD-10-CM

## 2012-07-25 MED ORDER — NEBIVOLOL HCL 5 MG PO TABS: 5.0000 mg | ORAL_TABLET | Freq: Every day | ORAL | Status: DC

## 2012-07-25 NOTE — Telephone Encounter (Signed)
Please return call to patient at [phone], concerning bystolic medication discussion.

## 2012-07-25 NOTE — Telephone Encounter (Signed)
Patient wanted to change to Bystolic 5 mg tablets instead of 10 mg tabs

## 2012-08-12 ENCOUNTER — Telehealth: Payer: Self-pay

## 2012-08-12 NOTE — Telephone Encounter (Signed)
Adding an EGD for Hemoccult-positive stool and GERD would be reasonable. Okay to do. Thanks

## 2012-08-12 NOTE — Telephone Encounter (Signed)
EGD added to the schedule and pt aware.

## 2012-08-12 NOTE — Telephone Encounter (Signed)
Message copied by Chrystie Nose on Tue Aug 12, 2012  3:50 PM ------      Message from: Hilarie Fredrickson      Created: Tue Aug 12, 2012  3:18 PM      Regarding: heme positive stool       Bonita Quin, Dr. Timothy Lasso sent me a lab result with Hemoccult-positive stool. The patient has a history of adenomatous colon polyps. His last colonoscopy was in August of 2009. Routine followup in 5 years recommended. However, in light of the Hemoccult-positive stool, set him up for surveillance colonoscopy at this time. Please contact the patient and let him know. Convert this to phone note for the records. Thanks. Dr. Marina Goodell

## 2012-08-12 NOTE — Telephone Encounter (Signed)
Pt scheduled for previsit 09/04/12@3 :30pm, colon scheduled for 09/09/12@3pm . Pt aware of appt dates and times.  Pt wanted to know if he should have an EGD also, last one done in 2009. States he has a history of reflux. Also states that he has vocal cord atrophy and sees Dr. Ovidio Kin for this, wants to make sure it would be ok for the EGD. Please advise.

## 2012-09-09 ENCOUNTER — Encounter: Payer: Medicare Other | Admitting: Internal Medicine

## 2012-10-27 ENCOUNTER — Ambulatory Visit (AMBULATORY_SURGERY_CENTER): Payer: Medicare Other | Admitting: *Deleted

## 2012-10-27 VITALS — Ht 72.0 in | Wt 163.0 lb

## 2012-10-27 DIAGNOSIS — K222 Esophageal obstruction: Secondary | ICD-10-CM

## 2012-10-27 DIAGNOSIS — Z1211 Encounter for screening for malignant neoplasm of colon: Secondary | ICD-10-CM

## 2012-10-27 DIAGNOSIS — K921 Melena: Secondary | ICD-10-CM

## 2012-10-27 DIAGNOSIS — K219 Gastro-esophageal reflux disease without esophagitis: Secondary | ICD-10-CM

## 2012-10-27 MED ORDER — MOVIPREP 100 G PO SOLR: ORAL | Status: DC

## 2012-11-07 ENCOUNTER — Encounter: Payer: Self-pay | Admitting: Internal Medicine

## 2012-11-07 ENCOUNTER — Ambulatory Visit (AMBULATORY_SURGERY_CENTER): Payer: Medicare Other | Admitting: Internal Medicine

## 2012-11-07 VITALS — BP 142/79 | HR 68 | Temp 97.6°F | Resp 20 | Ht 72.0 in | Wt 163.0 lb

## 2012-11-07 DIAGNOSIS — K222 Esophageal obstruction: Secondary | ICD-10-CM

## 2012-11-07 DIAGNOSIS — Z1211 Encounter for screening for malignant neoplasm of colon: Secondary | ICD-10-CM

## 2012-11-07 DIAGNOSIS — R195 Other fecal abnormalities: Secondary | ICD-10-CM

## 2012-11-07 DIAGNOSIS — Z86010 Personal history of colonic polyps: Secondary | ICD-10-CM

## 2012-11-07 DIAGNOSIS — K219 Gastro-esophageal reflux disease without esophagitis: Secondary | ICD-10-CM

## 2012-11-07 DIAGNOSIS — R1310 Dysphagia, unspecified: Secondary | ICD-10-CM

## 2012-11-07 DIAGNOSIS — K921 Melena: Secondary | ICD-10-CM

## 2012-11-07 MED ORDER — SODIUM CHLORIDE 0.9 % IV SOLN: 500.0000 mL | INTRAVENOUS | Status: DC

## 2012-11-07 NOTE — Patient Instructions (Addendum)
YOU HAD AN ENDOSCOPIC PROCEDURE TODAY AT THE [facility]: Refer to the procedure report that was given to you for any specific questions about what was found during the examination.  If the procedure report does not answer your questions, please call your gastroenterologist to clarify.  If you requested that your care partner not be given the details of your procedure findings, then the procedure report has been included in a sealed envelope for you to review at your convenience later.  YOU SHOULD EXPECT: Some feelings of bloating in the abdomen. Passage of more gas than usual.  Walking can help get rid of the air that was put into your GI tract during the procedure and reduce the bloating. If you had a lower endoscopy (such as a colonoscopy or flexible sigmoidoscopy) you may notice spotting of blood in your stool or on the toilet paper. If you underwent a bowel prep for your procedure, then you may not have a normal bowel movement for a few days.  DIET: DILATION DIET FOLLOW HAND OUT.  ACTIVITY: Your care partner should take you home directly after the procedure.  You should plan to take it easy, moving slowly for the rest of the day.  You can resume normal activity the day after the procedure however you should NOT DRIVE or use heavy machinery for 24 hours (because of the sedation medicines used during the test).    SYMPTOMS TO REPORT IMMEDIATELY: A gastroenterologist can be reached at any hour.  During normal business hours, 8:30 AM to 5:00 PM Monday through Friday, call [phone].  After hours and on weekends, please call the GI answering service at [phone] who will take a message and have the physician on call contact you.   Following lower endoscopy (colonoscopy or flexible sigmoidoscopy):  Excessive amounts of blood in the stool  Significant tenderness or worsening of abdominal pains  Swelling of the abdomen that is new, acute  Fever of 100F or higher  Following  upper endoscopy (EGD)  Vomiting of blood or coffee ground material  New chest pain or pain under the shoulder blades  Painful or persistently difficult swallowing  New shortness of breath  Fever of 100F or higher  Black, tarry-looking stools  FOLLOW UP: If any biopsies were taken you will be contacted by phone or by letter within the next 1-3 weeks.  Call your gastroenterologist if you have not heard about the biopsies in 3 weeks.  Our staff will call the home number listed on your records the next business day following your procedure to check on you and address any questions or concerns that you may have at that time regarding the information given to you following your procedure. This is a courtesy call and so if there is no answer at the home number and we have not heard from you through the emergency physician on call, we will assume that you have returned to your regular daily activities without incident.  SIGNATURES/CONFIDENTIALITY: You and/or your care partner have signed paperwork which will be entered into your electronic medical record.  These signatures attest to the fact that that the information above on your After Visit Summary has been reviewed and is understood.  Full responsibility of the confidentiality of this discharge information lies with you and/or your care-partner.   Resume medications. Information given on diverticulosis,high fiber diet,esophagitis and dilation diet with discharge instructions.

## 2012-11-07 NOTE — Progress Notes (Signed)
Patient did not experience any of the following events: a burn prior to discharge; a fall within the facility; wrong site/side/patient/procedure/implant event; or a hospital transfer or hospital admission upon discharge from the facility. (G8907) Patient did not have preoperative order for IV antibiotic SSI prophylaxis. (G8918)  

## 2012-11-07 NOTE — Op Note (Signed)
[facility] [address] [place], [address]   COLONOSCOPY PROCEDURE REPORT  PATIENT: Ryan Lynn, Ryan Lynn  MR#: [id number] BIRTHDATE: 07-Dec-1934 , 78  yrs. old GENDER: Male ENDOSCOPIST: Roxy Cedar, MD REFERRED LK:GMWN Timothy Lasso, M.D. PROCEDURE DATE:  11/07/2012 PROCEDURE:   Colonoscopy, surveillance ASA CLASS:   Class II INDICATIONS:patient's personal history of adenomatous colon polyps (2004 w/ TA; 2007 HPP) and heme-positive stool. MEDICATIONS: MAC sedation, administered by CRNA and propofol (Diprivan) 200mg  IV  DESCRIPTION OF PROCEDURE:   After the risks benefits and alternatives of the procedure were thoroughly explained, informed consent was obtained.  A digital rectal exam revealed no abnormalities of the rectum.   The LB CF-H180AL [id number]  endoscope was introduced through the anus and advanced to the cecum, which was identified by both the appendix and ileocecal valve. No adverse events experienced.   The quality of the prep was excellent, using MoviPrep  The instrument was then slowly withdrawn as the colon was fully examined.      COLON FINDINGS: Moderate diverticulosis was noted The finding was in the left colon.   The colon mucosa was otherwise normal. Retroflexed views revealed internal hemorrhoids. The time to cecum=3 minutes 13 seconds.  Withdrawal time=10 minutes 0 seconds. The scope was withdrawn and the procedure completed. COMPLICATIONS: There were no complications.  ENDOSCOPIC IMPRESSION: 1.   Moderate diverticulosis was noted in the left colon 2.   The colon mucosa was otherwise normal 3.   No polyps  RECOMMENDATIONS: 1.  Return to the care of your primary provider.  GI follow up as needed 2.  Upper endoscopy today (see report)   eSigned:  Roxy Cedar, MD 11/07/2012 11:41 AM   cc: Creola Corn, MD and The Patient   PATIENT NAME:  [name], [name] MR#: [id number]

## 2012-11-07 NOTE — Op Note (Signed)
[facility] [address] [place], [address]   ENDOSCOPY PROCEDURE REPORT  PATIENT: Ryan Lynn, Ryan Lynn  MR#: [id number] BIRTHDATE: June 18, 1934 , 78  yrs. old GENDER: Male ENDOSCOPIST: Roxy Cedar, MD REFERRED BY:  Creola Corn, M.D. PROCEDURE DATE:  11/07/2012 PROCEDURE:  EGD, diagnostic Maloney dilation of esophagus - 12 F ASA CLASS:     Class II INDICATIONS:  heme positive stool.   history of esophageal reflux. dysphagia. MEDICATIONS: MAC sedation, administered by CRNA and propofol (Diprivan) 50mg  IV TOPICAL ANESTHETIC: Cetacaine Spray  DESCRIPTION OF PROCEDURE: After the risks benefits and alternatives of the procedure were thoroughly explained, informed consent was obtained.  The LB GIF-H180 [id number] endoscope was introduced through the mouth and advanced to the second portion of the duodenum. Without limitations.  The instrument was slowly withdrawn as the mucosa was fully examined.      The proximal and midesophagus were normal.  the distal esophagus revealed a large caliber ring like stricture with associated mild esophagitis.  The stomach was normal as was the duodenal bulb and post bulbar duodenum.   Retroflex view revealed a small hiatal hernia  THERAPY : 54 French Maloney dilator was passed blindly into the esophagus without resistance or heme.  Tolerated well.  Re     The scope was then withdrawn from the patient and the procedure completed.  COMPLICATIONS: There were no complications.  ENDOSCOPIC IMPRESSION: 1. mild esophagitis 2. esophageal stricture s/p dilation 3. otherwise normal exam.  THERAPY : 45 French Maloney dilator was passed blindly into the esophagus without resistance or heme.  Tolerated well  RECOMMENDATIONS: 1.  Clear liquids until 2pm , then soft foods rest of day.  Resume prior diet tomorrow. 2.   Prilosec 20 mg daily REPEAT EXAM:    eSigned:  Roxy Cedar, MD 11/07/2012 11:57 AMrevisCC:Vasilis Luhman Timothy Lasso, MD and The  Patient

## 2012-11-10 ENCOUNTER — Telehealth: Payer: Self-pay | Admitting: *Deleted

## 2012-11-10 NOTE — Telephone Encounter (Signed)
  Follow up Call-  Call back number 11/07/2012  Post procedure Call Back phone  # [phone]  Permission to leave phone message Yes     Patient questions:  Do you have a fever, pain , or abdominal swelling? no Pain Score  0 *  Have you tolerated food without any problems? yes  Have you been able to return to your normal activities? yes  Do you have any questions about your discharge instructions: Diet   no Medications  no Follow up visit  no  Do you have questions or concerns about your Care? yes  Actions: * If pain score is 4 or above: No action needed, pain <4.     Patient states that he is "sucking air into his esophagus which causes him to burp."  He states that he was stretched Friday.         He is eating and in no pain.  Breathing fine.   States he "just wanted you to know."

## 2012-12-05 ENCOUNTER — Other Ambulatory Visit: Payer: Self-pay | Admitting: Dermatology

## 2013-02-18 ENCOUNTER — Ambulatory Visit (INDEPENDENT_AMBULATORY_CARE_PROVIDER_SITE_OTHER): Payer: Medicare Other | Admitting: Cardiology

## 2013-02-18 ENCOUNTER — Encounter: Payer: Self-pay | Admitting: Cardiology

## 2013-02-18 VITALS — BP 142/78 | HR 74 | Ht 72.0 in | Wt 166.4 lb

## 2013-02-18 DIAGNOSIS — R197 Diarrhea, unspecified: Secondary | ICD-10-CM

## 2013-02-18 DIAGNOSIS — I119 Hypertensive heart disease without heart failure: Secondary | ICD-10-CM

## 2013-02-18 DIAGNOSIS — I493 Ventricular premature depolarization: Secondary | ICD-10-CM

## 2013-02-18 MED ORDER — NEBIVOLOL HCL 5 MG PO TABS: 5.0000 mg | ORAL_TABLET | Freq: Every day | ORAL | Status: DC

## 2013-02-18 NOTE — Patient Instructions (Signed)
Your physician recommends that you continue on your current medications as directed. Please refer to the Current Medication list given to you today.  Your physician wants you to follow-up in: 6 months. You will receive a reminder letter in the mail two months in advance. If you don't receive a letter, please call our office to schedule the follow-up appointment.  

## 2013-02-18 NOTE — Assessment & Plan Note (Signed)
The patient's PVCs have been under good control on his current dose of Bystolic 5 mg daily.

## 2013-02-18 NOTE — Assessment & Plan Note (Signed)
The patient is recovering from a recent bout of diarrhea caused by taking Ceftinir for bronchitis.  It caused his stools to turn black also.

## 2013-02-18 NOTE — Progress Notes (Signed)
Ryan Lynn Date of Birth:  [birth year] [facility] [address] [place], [place]  [address] [phone]         Fax   [phone]  History of Present Illness: This pleasant 79 year old gentleman is seen for a six-month followup office visit. The patient has a past history of symptomatic palpitations. He's also had a past history of atypical chest pain and a history of essential hypertension. He has had palpitations for about 20 years. We first saw him in February 2011. He had a normal treadmill Cardiolite stress test in February 2011 and he does not have any evidence of ischemic heart disease. He has PVCs were initially difficult to control. Initially we tried metoprolol which did not tolerate and we switched him to [facility] which has been more effective for him. Other problems that he has includes xerostomia and lack of adequate saliva and he also has been diagnosed with vocal cord atrophy causing hoarseness. Dr. Narda Bonds is is ENT physician.  At his last visit a week he was considering using Evoxac for his xerostomia but because of its potential cardiac side effects including heart block we felt that it would be best not to use it.   Current Outpatient Prescriptions  Medication Sig Dispense Refill  . Cholecalciferol (VITAMIN D) 2000 UNITS tablet Take 4,000 Units by mouth daily.       . dorzolamide (TRUSOPT) 2 % ophthalmic solution as directed.      . fluticasone (FLONASE) 50 MCG/ACT nasal spray Place 2 sprays into the nose at bedtime.      Marland Kitchen loperamide (IMODIUM) 2 MG capsule Take 2 mg by mouth 4 (four) times daily as needed.        . loratadine (CLARITIN) 10 MG tablet Take 10 mg by mouth as needed for allergies.      Marland Kitchen nebivolol (BYSTOLIC) 5 MG tablet Take 1 tablet (5 mg total) by mouth daily.  90 tablet  3  . omeprazole (PRILOSEC) 20 MG capsule Take 20 mg by mouth daily.      . simethicone (MYLICON) 125 MG chewable tablet Chew 125 mg by mouth as needed.       .  travoprost, benzalkonium, (TRAVATAN) 0.004 % ophthalmic solution 1 drop as directed.        . vitamin C (ASCORBIC ACID) 500 MG tablet Take 1,000 mg by mouth daily.        No current facility-administered medications for this visit.    Allergies  Allergen Reactions  . Cefdinir   . Clindamycin/Lincomycin Diarrhea and Other (See Comments)    c diff  . Esomeprazole Magnesium Diarrhea    Patient Active Problem List  Diagnosis  . COLONIC POLYPS  . HEMORRHOIDS-EXTERNAL  . HEMORRHOIDS  . GERD  . DIVERTICULOSIS, COLON  . DYSPHAGIA  . FECAL INCONTINENCE  . DIARRHEA  . PERSONAL HX COLONIC POLYPS  . PVC's (premature ventricular contractions)  . Benign hypertensive heart disease without heart failure  . Fatigue  . Xerostomia    History  Smoking status  . Never Smoker   Smokeless tobacco  . Never Used    History  Alcohol Use No    Family History  Problem Relation Age of Onset  . Heart failure Mother   . Cancer Father     Review of Systems: Constitutional: no fever chills diaphoresis or fatigue or change in weight.  Head and neck: no hearing loss, no epistaxis, no photophobia or visual disturbance. Respiratory: No cough, shortness of breath  or wheezing. Cardiovascular: No chest pain peripheral edema, palpitations. Gastrointestinal: No abdominal distention, no abdominal pain, no change in bowel habits hematochezia or melena. Genitourinary: No dysuria, no frequency, no urgency, no nocturia. Musculoskeletal:No arthralgias, no back pain, no gait disturbance or myalgias. Neurological: No dizziness, no headaches, no numbness, no seizures, no syncope, no weakness, no tremors. Hematologic: No lymphadenopathy, no easy bruising. Psychiatric: No confusion, no hallucinations, no sleep disturbance.    Physical Exam: Filed Vitals:   02/18/13 1401  BP: 142/78  Pulse: 74   the general appearance reveals an elderly gentleman in no distress.The head and neck exam reveals pupils  equal and reactive.  Extraocular movements are full.  There is no scleral icterus.  The mouth and pharynx are normal.  The neck is supple.  The carotids reveal no bruits.  The jugular venous pressure is normal.  The  thyroid is not enlarged.  There is no lymphadenopathy.  The chest is clear to percussion and auscultation.  There are no rales or rhonchi.  Expansion of the chest is symmetrical.  The precordium is quiet.  The first heart sound is normal.  The second heart sound is physiologically split.  There is no murmur gallop rub or click.  There is no abnormal lift or heave.  The abdomen is soft and nontender.  The bowel sounds are normal.  The liver and spleen are not enlarged.  There are no abdominal masses.  There are no abdominal bruits.  Extremities reveal good pedal pulses.  There is no phlebitis or edema.  There is no cyanosis or clubbing.  Strength is normal and symmetrical in all extremities.  There is no lateralizing weakness.  There are no sensory deficits.  The skin is warm and dry.  There is no rash.   EKG shows normal sinus rhythm and is within normal limits.  There are no PVCs  Assessment / Plan: Continue same medication. We gave him a new prescription for the 5 mg Bystolic pills which he takes once a day. Recheck in 6 months for followup office visit and EKG.  Continue regular walking exercise.

## 2013-02-18 NOTE — Assessment & Plan Note (Signed)
The patient brought in a list of representative blood pressures from home.  His blood pressure is labile but generally within acceptable limits.

## 2013-05-14 ENCOUNTER — Telehealth: Payer: Self-pay | Admitting: Cardiology

## 2013-05-14 DIAGNOSIS — I493 Ventricular premature depolarization: Secondary | ICD-10-CM

## 2013-05-14 MED ORDER — NEBIVOLOL HCL 2.5 MG PO TABS: 2.5000 mg | ORAL_TABLET | Freq: Every day | ORAL | Status: DC

## 2013-05-14 NOTE — Telephone Encounter (Signed)
Was using Timolol 0.5% eye drops but has been off for a while. His blood pressure has been running low and has no energy, fatigue. Patient has been taking his blood pressure and it has been running low.   Blood pressure readings yesterday:  8:00 am 133/75 HR 70 Before meds, felt ok     128/73 HR 72  10:00      85/49  HR 80 After meds        78/46  HR 78  Did not feel well  2:30    113/62 HR 77                 105/56 HR 75     Felt dizzy   9:30 pm 130/67 HR 76                125/62 HR 74     Feeling better  These are how his blood pressures having been running on a daily basis. Will forward to  Dr. Patty Sermons for review

## 2013-05-14 NOTE — Telephone Encounter (Signed)
Decrease bystolic to 2.5 mg daily. 

## 2013-05-14 NOTE — Telephone Encounter (Signed)
Advised patient. He will call back next week with update and will schedule ov with  Dr. Patty Sermons then.

## 2013-05-14 NOTE — Telephone Encounter (Signed)
New Prob       Pt has some questions regarding upcoming appt. Would like to speak to nurse.

## 2013-05-20 ENCOUNTER — Telehealth: Payer: Self-pay | Admitting: Cardiology

## 2013-05-20 NOTE — Telephone Encounter (Signed)
Spoke with patient and he is doing much better on the lower dose of Bystolic. Blood pressure has been running 130's/70's heart rate 70's-80's before am medications. Did have one reading of 94/57 and 4 minutes later 103/61 but states he felt ok. Patient knows he needs to go back on the  Timolol 0.5% eye drops but is concerned about blood pressure getting low. Will forward to  Dr. Patty Sermons for review, patient aware it will be next week.

## 2013-05-20 NOTE — Telephone Encounter (Signed)
New Prob     Pt just wanted to notify nurse and Dr. Patty Sermons that he is feeling well (in regards to conversation from last week).

## 2013-05-24 NOTE — Telephone Encounter (Signed)
Okay to resume the timolol eyedrops.

## 2013-05-27 NOTE — Telephone Encounter (Signed)
Patient still had a systolic blood pressure drop below 100 on Monday. Wants to wait on starting drops, has an appointment in July with ophthalmologist. Blood pressure today 150's with some palpitations. Patient would like to continue same dose of medications and continue to monitor. Scheduled appointment for 06/11/13 for follow up. Will forward to  Dr. Patty Sermons for review

## 2013-05-27 NOTE — Telephone Encounter (Signed)
Agree with plan 

## 2013-05-27 NOTE — Telephone Encounter (Signed)
Left message to call back  Needs 4 month ov scheduled

## 2013-06-11 ENCOUNTER — Encounter: Payer: Self-pay | Admitting: Cardiology

## 2013-06-11 ENCOUNTER — Ambulatory Visit (INDEPENDENT_AMBULATORY_CARE_PROVIDER_SITE_OTHER): Payer: Medicare Other | Admitting: Cardiology

## 2013-06-11 VITALS — BP 150/84 | HR 72 | Ht 73.0 in | Wt 158.8 lb

## 2013-06-11 DIAGNOSIS — I4949 Other premature depolarization: Secondary | ICD-10-CM

## 2013-06-11 DIAGNOSIS — R5383 Other fatigue: Secondary | ICD-10-CM

## 2013-06-11 DIAGNOSIS — I119 Hypertensive heart disease without heart failure: Secondary | ICD-10-CM

## 2013-06-11 DIAGNOSIS — I493 Ventricular premature depolarization: Secondary | ICD-10-CM

## 2013-06-11 NOTE — Assessment & Plan Note (Signed)
The patient has not been having any recent PVCs or palpitations.  He remains on low-dose Bystolic 2.5 mg daily.

## 2013-06-11 NOTE — Progress Notes (Signed)
Violet Baldy Date of Birth:  [birth year] [facility] [address] [place], [place]  [address] [phone]         Fax   [phone]  History of Present Illness: This pleasant 79 year old gentleman is seen for a four-month followup office visit. The patient has a past history of symptomatic palpitations. He's also had a past history of atypical chest pain and a history of essential hypertension. He has had palpitations for about 20 years. We first saw him in February 2011. He had a normal treadmill Cardiolite stress test in February 2011 and he does not have any evidence of ischemic heart disease. He has PVCs were initially difficult to control. Initially we tried metoprolol which did not tolerate and we switched him to [facility] which has been more effective for him. Other problems that he has includes xerostomia and lack of adequate saliva and he also has been diagnosed with vocal cord atrophy causing hoarseness.  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  Dr. Narda Bonds is his ENT physician. At his last visit  he was considering using Evoxac for his xerostomia but because of its potential cardiac side effects including heart block we felt that it would be best not to use it.  Since last visit he has been diagnosed with worsening glaucoma.  He was given a trial of timolol drops but developed side effects of hypotension and profound weakness.   Current Outpatient Prescriptions  Medication Sig Dispense Refill  . calcium carbonate (TUMS - DOSED IN MG ELEMENTAL CALCIUM) 500 MG chewable tablet Chew 1 tablet by mouth as needed for heartburn.      . dorzolamide (TRUSOPT) 2 % ophthalmic solution as directed.      . fluticasone (FLONASE) 50 MCG/ACT nasal spray Place 2 sprays into the nose as needed.       . loperamide (IMODIUM) 2 MG capsule Take 2 mg by mouth 4 (four) times daily as needed.        . loratadine  (CLARITIN) 10 MG tablet Take 10 mg by mouth as needed for allergies.      Marland Kitchen nebivolol (BYSTOLIC) 2.5 MG tablet Take 1 tablet (2.5 mg total) by mouth daily.  30 tablet  3  . Probiotic Product (ALIGN) 4 MG CAPS Take by mouth daily.      . simethicone (MYLICON) 125 MG chewable tablet Chew 125 mg by mouth as needed.       . vitamin C (ASCORBIC ACID) 500 MG tablet Take 1,000 mg by mouth daily.        No current facility-administered medications for this visit.    Allergies  Allergen Reactions  . Cefdinir   . Clindamycin/Lincomycin Diarrhea and Other (See Comments)    c diff  . Esomeprazole Magnesium Diarrhea    Patient Active Problem List   Diagnosis Date Noted  . PVC's (premature ventricular contractions) 06/25/2011    Priority: High  . Benign hypertensive heart disease without heart failure 06/25/2011    Priority: High  . Xerostomia 07/23/2012  . Fatigue 12/26/2011  . HEMORRHOIDS-EXTERNAL 09/26/2009  . FECAL INCONTINENCE 09/26/2009  . PERSONAL HX COLONIC POLYPS 09/26/2009  . GERD 07/14/2008  . DYSPHAGIA 07/14/2008  . DIARRHEA 07/14/2008  . COLONIC POLYPS 07/13/2008  . HEMORRHOIDS 07/13/2008  . DIVERTICULOSIS, COLON 07/13/2008    History  Smoking status  . Never Smoker   Smokeless tobacco  . Never Used    History  Alcohol Use No    Family History  Problem Relation Age of Onset  . Heart failure Mother   . Cancer Father     Review of Systems: Constitutional: no fever chills diaphoresis or fatigue or change in weight.  Head and neck: no hearing loss, no epistaxis, no photophobia or visual disturbance. Respiratory: No cough, shortness of breath or wheezing. Cardiovascular: No chest pain peripheral edema, palpitations. Gastrointestinal: No abdominal distention, no abdominal pain, no change in bowel habits hematochezia or melena. Genitourinary: No dysuria, no frequency, no urgency, no nocturia. Musculoskeletal:No arthralgias, no back pain, no gait disturbance or  myalgias. Neurological: No dizziness, no headaches, no numbness, no seizures, no syncope, no weakness, no tremors. Hematologic: No lymphadenopathy, no easy bruising. Psychiatric: No confusion, no hallucinations, no sleep disturbance.    Physical Exam: Filed Vitals:   06/11/13 1055  BP: 150/84  Pulse: 72   the general appearance reveals a tall thin gentleman in no acute distress.  His blood pressure is high today which it always is in a doctor's office he states.The head and neck exam reveals pupils equal and reactive.  Extraocular movements are full.  There is no scleral icterus.  The mouth and pharynx are normal.  The patient is hoarse in his voice.  The neck is supple.  The carotids reveal no bruits.  The jugular venous pressure is normal.  The  thyroid is not enlarged.  There is no lymphadenopathy.  The chest is clear to percussion and auscultation.  There are no rales or rhonchi.  Expansion of the chest is symmetrical.  The precordium is quiet.  The first heart sound is normal.  The second heart sound is physiologically split.  There is no murmur gallop rub or click.  There is no abnormal lift or heave.  The abdomen is soft and nontender.  The bowel sounds are normal.  The liver and spleen are not enlarged.  There are no abdominal masses.  There are no abdominal bruits.  Extremities reveal good pedal pulses.  There is no phlebitis or edema.  There is no cyanosis or clubbing.  Strength is normal and symmetrical in all extremities.  There is no lateralizing weakness.  There are no sensory deficits.  The skin is warm and dry.  There is no rash.     Assessment / Plan: The patient is to continue same medication.  Recheck in 4 months for followup office visit and EKG

## 2013-06-11 NOTE — Patient Instructions (Signed)
Your physician recommends that you continue on your current medications as directed. Please refer to the Current Medication list given to you today.  Your physician wants you to follow-up in: 4 month ov You will receive a reminder letter in the mail two months in advance. If you don't receive a letter, please call our office to schedule the follow-up appointment.  

## 2013-06-11 NOTE — Assessment & Plan Note (Signed)
He brought in an extensive list of his daily blood pressures over the past several months.  He did not do well when he was on both bystolic and timolol because his systolic blood pressure would drop into the 90s.  Presently he is off timolol.  He will talk with his ophthalmologist about using an alternative drug or possibly a lower dose of timolol if necessary.

## 2013-06-11 NOTE — Assessment & Plan Note (Signed)
Patient remains very fatigued.  He still has some irregular bowel movements.  His weight is down 8 pounds since last visit.

## 2013-07-13 ENCOUNTER — Other Ambulatory Visit: Payer: Self-pay | Admitting: *Deleted

## 2013-07-13 DIAGNOSIS — I493 Ventricular premature depolarization: Secondary | ICD-10-CM

## 2013-07-13 MED ORDER — NEBIVOLOL HCL 2.5 MG PO TABS: 2.5000 mg | ORAL_TABLET | Freq: Every day | ORAL | Status: DC

## 2013-07-24 ENCOUNTER — Other Ambulatory Visit: Payer: Self-pay | Admitting: Dermatology

## 2013-08-24 ENCOUNTER — Other Ambulatory Visit: Payer: Self-pay | Admitting: Otolaryngology

## 2013-08-24 DIAGNOSIS — R1310 Dysphagia, unspecified: Secondary | ICD-10-CM

## 2013-08-25 ENCOUNTER — Encounter: Payer: Self-pay | Admitting: Internal Medicine

## 2013-08-27 ENCOUNTER — Other Ambulatory Visit: Payer: Medicare Other

## 2013-09-02 ENCOUNTER — Other Ambulatory Visit: Payer: Medicare Other

## 2013-10-09 ENCOUNTER — Ambulatory Visit
Admission: RE | Admit: 2013-10-09 | Discharge: 2013-10-09 | Disposition: A | Discharge status: Home / Self Care | Payer: Medicare Other | Source: Ambulatory Visit | Attending: Otolaryngology | Admitting: Otolaryngology

## 2013-10-09 DIAGNOSIS — R1310 Dysphagia, unspecified: Secondary | ICD-10-CM

## 2013-10-15 ENCOUNTER — Encounter (INDEPENDENT_AMBULATORY_CARE_PROVIDER_SITE_OTHER): Payer: Self-pay

## 2013-10-15 ENCOUNTER — Ambulatory Visit (INDEPENDENT_AMBULATORY_CARE_PROVIDER_SITE_OTHER): Payer: Medicare Other | Admitting: Cardiology

## 2013-10-15 ENCOUNTER — Encounter: Payer: Self-pay | Admitting: Cardiology

## 2013-10-15 VITALS — BP 142/77 | HR 78 | Ht 73.0 in | Wt 163.0 lb

## 2013-10-15 DIAGNOSIS — R490 Dysphonia: Secondary | ICD-10-CM

## 2013-10-15 DIAGNOSIS — I493 Ventricular premature depolarization: Secondary | ICD-10-CM

## 2013-10-15 DIAGNOSIS — N400 Enlarged prostate without lower urinary tract symptoms: Secondary | ICD-10-CM

## 2013-10-15 DIAGNOSIS — I119 Hypertensive heart disease without heart failure: Secondary | ICD-10-CM

## 2013-10-15 DIAGNOSIS — I4949 Other premature depolarization: Secondary | ICD-10-CM

## 2013-10-15 NOTE — Assessment & Plan Note (Signed)
He is no longer having problems with high blood pressure.  Now the problem is that his blood pressure is running too low.  He brought in a daily list of his blood pressure readings over the past month.  The only medications which she is chronically taking which would be causing his blood pressure to be low would be the beta blocker and the doxazosin

## 2013-10-15 NOTE — Progress Notes (Signed)
Ryan Lynn Date of Birth:  [birth year] [address] [place], [place]  [address] [phone]         Fax   [phone]  History of Present Illness: This pleasant 79 year old gentleman is seen for a four-month followup office visit. The patient has a past history of symptomatic palpitations. He's also had a past history of atypical chest pain and a history of essential hypertension. He has had palpitations for about 20 years. We first saw him in February 2011. He had a normal treadmill Cardiolite stress test in February 2011 and he does not have any evidence of ischemic heart disease. He has PVCs were initially difficult to control. Initially we tried metoprolol which did not tolerate and we switched him to [facility] which has been more effective for him. Other problems that he has includes xerostomia and lack of adequate saliva and he also has been diagnosed with vocal cord atrophy causing hoarseness.  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  Dr. Narda Bonds is his ENT physician. At his last visit  he was considering using Evoxac for his xerostomia but because of its potential cardiac side effects including heart block we felt that it would be best not to use it.  He has a history of symptomatic BPH.  His urologist gave him a trial of doxazosin which he took just one time because it dropped his blood pressure too low.   Current Outpatient Prescriptions  Medication Sig Dispense Refill  . calcium carbonate (TUMS - DOSED IN MG ELEMENTAL CALCIUM) 500 MG chewable tablet Chew 1 tablet by mouth as needed for heartburn.      . cetirizine (ZYRTEC) 10 MG tablet Take 10 mg by mouth daily.      . dorzolamide (TRUSOPT) 2 % ophthalmic solution as directed.      . fluticasone (FLONASE) 50 MCG/ACT nasal spray Place 2 sprays into the nose as needed.       . latanoprost (XALATAN) 0.005 % ophthalmic solution 1 drio each eye  twice a day      . loperamide (IMODIUM) 2 MG capsule Take 2 mg by mouth 4 (four) times daily as needed.        . loratadine (CLARITIN) 10 MG tablet Take 10 mg by mouth as needed for allergies.      Marland Kitchen nebivolol (BYSTOLIC) 2.5 MG tablet Take 2.5 mg by mouth as directed. EVERY OTHER DAY X 2 WEEKS AND THEN STOP      . Probiotic Product (ALIGN) 4 MG CAPS Take by mouth daily.      . simethicone (MYLICON) 125 MG chewable tablet Chew 125 mg by mouth as needed.       . vitamin C (ASCORBIC ACID) 500 MG tablet Take 1,000 mg by mouth daily.        No current facility-administered medications for this visit.    Allergies  Allergen Reactions  . Cefdinir   . Clindamycin/Lincomycin Diarrhea and Other (See Comments)    c diff  . Esomeprazole Magnesium Diarrhea    Patient Active Problem List   Diagnosis Date Noted  . PVC's (premature ventricular contractions) 06/25/2011    Priority: High  . Benign hypertensive heart disease without heart failure 06/25/2011    Priority: High  . Hoarseness of voice 10/15/2013  . Xerostomia 07/23/2012  . Fatigue 12/26/2011  . HEMORRHOIDS-EXTERNAL 09/26/2009  . FECAL INCONTINENCE 09/26/2009  . PERSONAL HX COLONIC POLYPS 09/26/2009  .  GERD 07/14/2008  . DYSPHAGIA 07/14/2008  . DIARRHEA 07/14/2008  . COLONIC POLYPS 07/13/2008  . HEMORRHOIDS 07/13/2008  . DIVERTICULOSIS, COLON 07/13/2008    History  Smoking status  . Never Smoker   Smokeless tobacco  . Never Used    History  Alcohol Use No    Family History  Problem Relation Age of Onset  . Heart failure Mother   . Cancer Father     Review of Systems: Constitutional: no fever chills diaphoresis or fatigue or change in weight.  Head and neck: no hearing loss, no epistaxis, no photophobia or visual disturbance. Respiratory: No cough, shortness of breath or wheezing. Cardiovascular: No chest pain peripheral edema, palpitations. Gastrointestinal: No abdominal distention, no abdominal pain, no change  in bowel habits hematochezia or melena. Genitourinary: No dysuria, no frequency, no urgency, no nocturia. Musculoskeletal:No arthralgias, no back pain, no gait disturbance or myalgias. Neurological: No dizziness, no headaches, no numbness, no seizures, no syncope, no weakness, no tremors. Hematologic: No lymphadenopathy, no easy bruising. Psychiatric: No confusion, no hallucinations, no sleep disturbance.    Physical Exam: Filed Vitals:   10/15/13 1417  BP: 142/77  Pulse: 78   the general appearance reveals a tall thin gentleman in no acute distress.  His blood pressure is high today which it always is in a doctor's office he states.The head and neck exam reveals pupils equal and reactive.  Extraocular movements are full.  There is no scleral icterus.  The mouth and pharynx are normal.  The patient is hoarse in his voice.  The neck is supple.  The carotids reveal no bruits.  The jugular venous pressure is normal.  The  thyroid is not enlarged.  There is no lymphadenopathy.  The chest is clear to percussion and auscultation.  There are no rales or rhonchi.  Expansion of the chest is symmetrical.  The precordium is quiet.  The first heart sound is normal.  The second heart sound is physiologically split.  There is no murmur gallop rub or click.  There is no abnormal lift or heave.  The abdomen is soft and nontender.  The bowel sounds are normal.  The liver and spleen are not enlarged.  There are no abdominal masses.  There are no abdominal bruits.  Extremities reveal good pedal pulses.  There is no phlebitis or edema.  There is no cyanosis or clubbing.  Strength is normal and symmetrical in all extremities.  There is no lateralizing weakness.  There are no sensory deficits.  The skin is warm and dry.  There is no rash.  EKG today shows normal sinus rhythm at 78 per minute and no PVCs and no ischemic change   Assessment / Plan: The patient is to taper off his beta blocker.  Give  doxazosin another  trial for his BPH at the lowest possible dose.  Recheck in 4 months for followup office visit and EKG

## 2013-10-15 NOTE — Assessment & Plan Note (Signed)
His PVCs have been significantly decreased since he has been on low-dose bystolic.

## 2013-10-15 NOTE — Patient Instructions (Signed)
TAPER OFF BYSTOLIC DECREASE TO EVERY OTHER DAY TIMES 2 WEEKS AND THEN STOP  Your physician wants you to follow-up in: 4 MONTH OV You will receive a reminder letter in the mail two months in advance. If you don't receive a letter, please call our office to schedule the follow-up appointment.

## 2013-10-15 NOTE — Assessment & Plan Note (Signed)
The patient is having more symptoms from his BPH.  His urologist is Dr. Isabel Caprice. In order for him to be able to take his doxazosin, we will try tapering and stopping his beta blocker.  If his PVCs become worse, we could consider restarting his bystolic at a dose of just one half of a 2.5 mg tablet.

## 2013-10-19 ENCOUNTER — Other Ambulatory Visit: Payer: Self-pay | Admitting: Otolaryngology

## 2013-10-19 DIAGNOSIS — J3800 Paralysis of vocal cords and larynx, unspecified: Secondary | ICD-10-CM

## 2013-10-19 DIAGNOSIS — R1310 Dysphagia, unspecified: Secondary | ICD-10-CM

## 2013-10-22 ENCOUNTER — Ambulatory Visit
Admission: RE | Admit: 2013-10-22 | Discharge: 2013-10-22 | Disposition: A | Discharge status: Home / Self Care | Payer: Medicare Other | Source: Ambulatory Visit | Attending: Otolaryngology | Admitting: Otolaryngology

## 2013-10-22 ENCOUNTER — Other Ambulatory Visit: Payer: Medicare Other

## 2013-10-22 DIAGNOSIS — J3800 Paralysis of vocal cords and larynx, unspecified: Secondary | ICD-10-CM

## 2013-10-22 MED ORDER — IOHEXOL 300 MG/ML  SOLN
75.0000 mL | Freq: Once | INTRAMUSCULAR | Status: AC | PRN
  Administered 2013-10-22: 75 mL via INTRAVENOUS

## 2013-11-02 ENCOUNTER — Other Ambulatory Visit (HOSPITAL_COMMUNITY): Payer: Self-pay | Admitting: Otolaryngology

## 2013-11-02 ENCOUNTER — Telehealth: Payer: Self-pay | Admitting: Cardiology

## 2013-11-02 DIAGNOSIS — C07 Malignant neoplasm of parotid gland: Secondary | ICD-10-CM

## 2013-11-02 NOTE — Telephone Encounter (Signed)
Has tapered off Bystolic and is going to try Doxazosin. Information only

## 2013-11-02 NOTE — Telephone Encounter (Signed)
New Problem  As of Saturday he has stopped taking Bystolic/// he states that this week he will start a trial Doxazosin Mesylate.Marland Kitchen

## 2013-11-10 ENCOUNTER — Ambulatory Visit (HOSPITAL_COMMUNITY): Payer: Medicare Other

## 2013-11-11 ENCOUNTER — Encounter (HOSPITAL_COMMUNITY)
Admission: RE | Admit: 2013-11-11 | Discharge: 2013-11-11 | Disposition: A | Discharge status: Home / Self Care | Payer: Medicare Other | Source: Ambulatory Visit | Attending: Otolaryngology | Admitting: Otolaryngology

## 2013-11-11 ENCOUNTER — Encounter (HOSPITAL_COMMUNITY): Payer: Self-pay

## 2013-11-11 ENCOUNTER — Other Ambulatory Visit (HOSPITAL_COMMUNITY): Payer: Medicare Other

## 2013-11-11 DIAGNOSIS — S42009A Fracture of unspecified part of unspecified clavicle, initial encounter for closed fracture: Secondary | ICD-10-CM | POA: Insufficient documentation

## 2013-11-11 DIAGNOSIS — Z8589 Personal history of malignant neoplasm of other organs and systems: Secondary | ICD-10-CM | POA: Insufficient documentation

## 2013-11-11 DIAGNOSIS — X58XXXA Exposure to other specified factors, initial encounter: Secondary | ICD-10-CM | POA: Insufficient documentation

## 2013-11-11 DIAGNOSIS — R911 Solitary pulmonary nodule: Secondary | ICD-10-CM | POA: Insufficient documentation

## 2013-11-11 DIAGNOSIS — C07 Malignant neoplasm of parotid gland: Secondary | ICD-10-CM

## 2013-11-11 MED ORDER — FLUDEOXYGLUCOSE F - 18 (FDG) INJECTION
18.8000 | Freq: Once | INTRAVENOUS | Status: AC | PRN
  Administered 2013-11-11: 18.8 via INTRAVENOUS

## 2013-12-04 ENCOUNTER — Telehealth: Payer: Self-pay | Admitting: Cardiology

## 2013-12-04 NOTE — Telephone Encounter (Signed)
Advised patient, verbalized understanding. Will monitor blood pressure at home

## 2013-12-04 NOTE — Telephone Encounter (Signed)
Will forward to  Dr. Brackbill for review 

## 2013-12-04 NOTE — Telephone Encounter (Signed)
We will restart bystolic 2.5 mg tablet taking one half tablet daily.  Depending how he tolerates it he can move up to a full tablet of blood pressure tolerates it

## 2013-12-04 NOTE — Telephone Encounter (Signed)
New message     Cannot use the doxazosinmesylate prescribed by [facility]----next day blood pressure was 73/48.  Pt is supposed to keep [place] informed about using this medication.  Pt will be in and out today if you need him.

## 2013-12-11 ENCOUNTER — Other Ambulatory Visit: Payer: Self-pay | Admitting: Dermatology

## 2014-01-06 ENCOUNTER — Other Ambulatory Visit: Payer: Self-pay | Admitting: Dermatology

## 2014-02-10 ENCOUNTER — Other Ambulatory Visit: Payer: Self-pay | Admitting: Dermatology

## 2014-03-04 ENCOUNTER — Other Ambulatory Visit: Payer: Self-pay | Admitting: Internal Medicine

## 2014-03-04 DIAGNOSIS — R911 Solitary pulmonary nodule: Secondary | ICD-10-CM

## 2014-03-11 ENCOUNTER — Other Ambulatory Visit: Payer: Medicare Other

## 2014-03-18 ENCOUNTER — Ambulatory Visit
Admission: RE | Admit: 2014-03-18 | Discharge: 2014-03-18 | Disposition: A | Discharge status: Home / Self Care | Payer: Medicare Other | Source: Ambulatory Visit | Attending: Internal Medicine | Admitting: Internal Medicine

## 2014-03-18 DIAGNOSIS — R911 Solitary pulmonary nodule: Secondary | ICD-10-CM

## 2014-04-21 ENCOUNTER — Other Ambulatory Visit: Payer: Self-pay | Admitting: Dermatology

## 2014-04-23 ENCOUNTER — Encounter: Payer: Self-pay | Admitting: Cardiology

## 2014-04-23 ENCOUNTER — Ambulatory Visit (INDEPENDENT_AMBULATORY_CARE_PROVIDER_SITE_OTHER): Payer: Medicare Other | Admitting: Cardiology

## 2014-04-23 VITALS — BP 140/90 | HR 72 | Ht 73.0 in | Wt 165.0 lb

## 2014-04-23 DIAGNOSIS — R682 Dry mouth, unspecified: Secondary | ICD-10-CM

## 2014-04-23 DIAGNOSIS — K117 Disturbances of salivary secretion: Secondary | ICD-10-CM

## 2014-04-23 DIAGNOSIS — I493 Ventricular premature depolarization: Secondary | ICD-10-CM

## 2014-04-23 DIAGNOSIS — I4949 Other premature depolarization: Secondary | ICD-10-CM

## 2014-04-23 DIAGNOSIS — I119 Hypertensive heart disease without heart failure: Secondary | ICD-10-CM

## 2014-04-23 NOTE — Progress Notes (Signed)
Ryan Lynn Date of Birth:  1934-04-21 [address] [place], [place]  [address] [phone]         Fax   [phone]  History of Present Illness: This pleasant 81 year old gentleman is seen for a six-month followup office visit. The patient has a past history of symptomatic palpitations. He's also had a past history of atypical chest pain and a history of essential hypertension. He has had palpitations for about 20 years. We first saw him in February 2011. He had a normal treadmill Cardiolite stress test in February 2011 and he does not have any evidence of ischemic heart disease. He has PVCs were initially difficult to control. Initially we tried metoprolol which did not tolerate and we switched him to [facility] which has been more effective for him. Other problems that he has includes xerostomia and lack of adequate saliva and he also has been diagnosed with vocal cord atrophy causing hoarseness.  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  Dr. Radene Journey is his ENT physician. At his last visit  he was considering using Evoxac for his xerostomia but because of its potential cardiac side effects including heart block we felt that it would be best not to use it.  He has a history of symptomatic BPH.  His urologist gave him a trial of doxazosin which he took just one time because it dropped his blood pressure too low.  Recently he had laser surgery for glaucoma on the right eye.  Postoperatively his intraocular pressures increased unexpectedly and new medication was prescribed but it adversely affected his blood pressure causing hypotension.   Current Outpatient Prescriptions  Medication Sig Dispense Refill  . Calcium Carbonate Antacid (TUMS ULTRA 1000 PO) Take 1,000 mg by mouth as needed.      . dorzolamide (TRUSOPT) 2 % ophthalmic solution Place 1 drop into both eyes 2 (two) times daily.       Marland Kitchen latanoprost  (XALATAN) 0.005 % ophthalmic solution Place 1 drop into both eyes daily. 1 drio each eye twice a day      . loratadine (CLARITIN) 10 MG tablet Take 10 mg by mouth as needed for allergies.      Marland Kitchen nebivolol (BYSTOLIC) 2.5 MG tablet Take 2.5 mg by mouth as directed. 1/2 tablet daily      . Probiotic Product (ALIGN) 4 MG CAPS Take by mouth daily.      . simethicone (MYLICON) 976 MG chewable tablet Chew 125 mg by mouth as needed.       . vitamin C (ASCORBIC ACID) 500 MG tablet Take 1,000 mg by mouth daily.        No current facility-administered medications for this visit.    Allergies  Allergen Reactions  . Cefdinir   . Clindamycin/Lincomycin Diarrhea and Other (See Comments)    c diff  . Doxazosin     Low BP and light-headedness  . Doxycycline Hyclate     Dizziness and upset stomach  . Esomeprazole Magnesium Diarrhea  . Simbrinza [Brinzolamide-Brimonidine]     Low BP and light-headedness, fatigue  . Timolol     Causes low  BP and light-headedness    Patient Active Problem List   Diagnosis Date Noted  . PVC's (premature ventricular contractions) 06/25/2011    Priority: High  . Benign hypertensive heart disease without heart failure 06/25/2011    Priority: High  . Hoarseness of voice 10/15/2013  .  BPH (benign prostatic hyperplasia) 10/15/2013  . Xerostomia 07/23/2012  . Fatigue 12/26/2011  . HEMORRHOIDS-EXTERNAL 09/26/2009  . FECAL INCONTINENCE 09/26/2009  . PERSONAL HX COLONIC POLYPS 09/26/2009  . GERD 07/14/2008  . DYSPHAGIA 07/14/2008  . DIARRHEA 07/14/2008  . COLONIC POLYPS 07/13/2008  . HEMORRHOIDS 07/13/2008  . DIVERTICULOSIS, COLON 07/13/2008    History  Smoking status  . Never Smoker   Smokeless tobacco  . Never Used    History  Alcohol Use No    Family History  Problem Relation Age of Onset  . Heart failure Mother   . Cancer Father     Review of Systems: Constitutional: no fever chills diaphoresis or fatigue or change in weight.  Head and neck: no  hearing loss, no epistaxis, no photophobia or visual disturbance. Respiratory: No cough, shortness of breath or wheezing. Cardiovascular: No chest pain peripheral edema, palpitations. Gastrointestinal: No abdominal distention, no abdominal pain, no change in bowel habits hematochezia or melena. Genitourinary: No dysuria, no frequency, no urgency, no nocturia. Musculoskeletal:No arthralgias, no back pain, no gait disturbance or myalgias. Neurological: No dizziness, no headaches, no numbness, no seizures, no syncope, no weakness, no tremors. Hematologic: No lymphadenopathy, no easy bruising. Psychiatric: No confusion, no hallucinations, no sleep disturbance.    Physical Exam: Filed Vitals:   04/23/14 1556  BP: 140/90  Pulse: 72   the general appearance reveals a tall thin gentleman in no acute distress.  His blood pressure is high today which it always is in a doctor's office he states.The head and neck exam reveals pupils equal and reactive.  Extraocular movements are full.  There is no scleral icterus.  The mouth and pharynx are normal.  The patient is hoarse in his voice.  The neck is supple.  The carotids reveal no bruits.  The jugular venous pressure is normal.  The  thyroid is not enlarged.  There is no lymphadenopathy.  The chest is clear to percussion and auscultation.  There are no rales or rhonchi.  Expansion of the chest is symmetrical.  The precordium is quiet.  The first heart sound is normal.  The second heart sound is physiologically split.  There is no murmur gallop rub or click.  There is no abnormal lift or heave.  The abdomen is soft and nontender.  The bowel sounds are normal.  The liver and spleen are not enlarged.  There are no abdominal masses.  There are no abdominal bruits.  Extremities reveal good pedal pulses.  There is no phlebitis or edema.  There is no cyanosis or clubbing.  Strength is normal and symmetrical in all extremities.  There is no lateralizing weakness.  There  are no sensory deficits.  The skin is warm and dry.  There is no rash.  EKG today shows normal sinus rhythm at 72 per minute.   Assessment / Plan: The patient is to continue current cardiac meds.  Recheck in 6 months for office visit and EKG.

## 2014-04-23 NOTE — Assessment & Plan Note (Signed)
The patient continues to have occasional PVCs but they have not bothered him as much and he is satisfied with current response to low dose Bystolic 2.53 mg daily

## 2014-04-23 NOTE — Assessment & Plan Note (Signed)
The patient brought in an extensive list of his blood pressure readings.  Blood pressure has generally been good but when he started Simbrinza eyedrops for his glaucoma his blood pressure dropped into the 60 systolic range.  It happened several days after which he stopped taking the new medicine.  Since then his blood pressure has been back to baseline

## 2014-04-23 NOTE — Patient Instructions (Signed)
Your physician recommends that you continue on your current medications as directed. Please refer to the Current Medication list given to you today.  Your physician wants you to follow-up in: 6 month ov/ekg You will receive a reminder letter in the mail two months in advance. If you don't receive a letter, please call our office to schedule the follow-up appointment.  

## 2014-08-24 ENCOUNTER — Encounter: Payer: Self-pay | Admitting: Internal Medicine

## 2014-09-30 ENCOUNTER — Other Ambulatory Visit: Payer: Self-pay | Admitting: Internal Medicine

## 2014-09-30 DIAGNOSIS — R911 Solitary pulmonary nodule: Secondary | ICD-10-CM

## 2014-10-07 ENCOUNTER — Ambulatory Visit
Admission: RE | Admit: 2014-10-07 | Discharge: 2014-10-07 | Disposition: A | Discharge status: Home / Self Care | Payer: Medicare Other | Source: Ambulatory Visit | Attending: Internal Medicine | Admitting: Internal Medicine

## 2014-10-07 ENCOUNTER — Other Ambulatory Visit: Payer: Self-pay | Admitting: Internal Medicine

## 2014-10-07 DIAGNOSIS — J479 Bronchiectasis, uncomplicated: Secondary | ICD-10-CM

## 2014-10-07 DIAGNOSIS — R911 Solitary pulmonary nodule: Secondary | ICD-10-CM

## 2014-10-28 ENCOUNTER — Ambulatory Visit (INDEPENDENT_AMBULATORY_CARE_PROVIDER_SITE_OTHER): Payer: Medicare Other | Admitting: Cardiology

## 2014-10-28 ENCOUNTER — Encounter: Payer: Self-pay | Admitting: Cardiology

## 2014-10-28 VITALS — BP 140/62 | HR 84 | Ht 73.0 in | Wt 163.4 lb

## 2014-10-28 DIAGNOSIS — N400 Enlarged prostate without lower urinary tract symptoms: Secondary | ICD-10-CM

## 2014-10-28 DIAGNOSIS — K117 Disturbances of salivary secretion: Secondary | ICD-10-CM

## 2014-10-28 DIAGNOSIS — I493 Ventricular premature depolarization: Secondary | ICD-10-CM

## 2014-10-28 DIAGNOSIS — I119 Hypertensive heart disease without heart failure: Secondary | ICD-10-CM

## 2014-10-28 DIAGNOSIS — R682 Dry mouth, unspecified: Secondary | ICD-10-CM

## 2014-10-28 NOTE — Progress Notes (Signed)
Ryan Lynn Date of Birth:  Jan 05, 1934 [facility] [address] [place], [place]  [address] [phone]        Fax   [phone]   History of Present Illness: This pleasant 80 year old gentleman is a medical patient of Dr. Virgina Jock.  He is seen for a six-month followup office visit. The patient has a past history of symptomatic palpitations. He's also had a past history of atypical chest pain and a history of essential hypertension. He has had palpitations for about 20 years. We first saw him in February 2011. He had a normal treadmill Cardiolite stress test in February 2011 and he does not have any evidence of ischemic heart disease. He has PVCs were initially difficult to control. Initially we tried metoprolol which did not tolerate and we switched him to [facility] which has been more effective for him. Other problems that he has includes xerostomia and lack of adequate saliva and he also has been diagnosed with vocal cord atrophy causing hoarseness. 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 Dr. Radene Journey is his ENT physician. At a previous visit he was considering using Evoxac for his xerostomia but because of its potential cardiac side effects including heart block we felt that it would be best not to use it.  He has a history of symptomatic BPH. His urologist gave him a trial of doxazosin which he took just one time because it dropped his blood pressure too low.   Current Outpatient Prescriptions  Medication Sig Dispense Refill  . Calcium Carbonate Antacid (TUMS ULTRA 1000 PO) Take 1,000 mg by mouth as needed.      . dorzolamide (TRUSOPT) 2 % ophthalmic solution Place 1 drop into both eyes 2 (two) times daily.       Marland Kitchen latanoprost (XALATAN) 0.005 % ophthalmic solution Place 1 drop into both eyes daily. 1 drio each eye twice a day      . loratadine (CLARITIN) 10 MG tablet Take 10 mg by mouth as needed  for allergies.      . Probiotic Product (ALIGN) 4 MG CAPS Take by mouth daily.      . simethicone (MYLICON) 694 MG chewable tablet Chew 125 mg by mouth as needed.       . vitamin C (ASCORBIC ACID) 500 MG tablet Take 1,000 mg by mouth daily.        No current facility-administered medications for this visit.    Allergies  Allergen Reactions  . Cefdinir   . Clindamycin/Lincomycin Diarrhea and Other (See Comments)    c diff  . Doxazosin     Low BP and light-headedness  . Doxycycline Hyclate     Dizziness and upset stomach  . Esomeprazole Magnesium Diarrhea  . Simbrinza [Brinzolamide-Brimonidine]     Low BP and light-headedness, fatigue  . Timolol     Causes low  BP and light-headedness    Patient Active Problem List   Diagnosis Date Noted  . PVC's (premature ventricular contractions) 06/25/2011    Priority: High  . Benign hypertensive heart disease without heart failure 06/25/2011    Priority: High  . Hoarseness of voice 10/15/2013  . BPH (benign prostatic hyperplasia) 10/15/2013  . Xerostomia 07/23/2012  . Fatigue 12/26/2011  . HEMORRHOIDS-EXTERNAL 09/26/2009  . FECAL INCONTINENCE 09/26/2009  . PERSONAL HX COLONIC POLYPS 09/26/2009  . GERD 07/14/2008  . DYSPHAGIA 07/14/2008  . DIARRHEA 07/14/2008  . COLONIC POLYPS  07/13/2008  . HEMORRHOIDS 07/13/2008  . DIVERTICULOSIS, COLON 07/13/2008    History  Smoking status  . Never Smoker   Smokeless tobacco  . Never Used    History  Alcohol Use No    Family History  Problem Relation Age of Onset  . Heart failure Mother   . Cancer Father     Review of Systems: Constitutional: no fever chills diaphoresis or fatigue or change in weight.  Head and neck: no hearing loss, no epistaxis, no photophobia or visual disturbance. Respiratory: No cough, shortness of breath or wheezing. Cardiovascular: No chest pain peripheral edema, palpitations. Gastrointestinal: No abdominal distention, no abdominal pain, no change in bowel  habits hematochezia or melena. Genitourinary: No dysuria, no frequency, no urgency, no nocturia. Musculoskeletal:No arthralgias, no back pain, no gait disturbance or myalgias. Neurological: No dizziness, no headaches, no numbness, no seizures, no syncope, no weakness, no tremors. Hematologic: No lymphadenopathy, no easy bruising. Psychiatric: No confusion, no hallucinations, no sleep disturbance.    Physical Exam: Filed Vitals:   10/28/14 1337  BP: 140/62  Pulse: 84  The patient appears to be in no distress.  Head and neck exam reveals that the pupils are equal and reactive.  The extraocular movements are full.  There is no scleral icterus.  Mouth and pharynx are benign.  No lymphadenopathy.  No carotid bruits.  The jugular venous pressure is normal.  Thyroid is not enlarged or tender.  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  Chest is clear to percussion and auscultation.  No rales or rhonchi.  Expansion of the chest is symmetrical.  Heart reveals no abnormal lift or heave.  First and second heart sounds are normal.  There is no murmur gallop rub or click.  Sporadic PVCs are present  The abdomen is soft and nontender.  Bowel sounds are normoactive.  There is no hepatosplenomegaly or mass.  There are no abdominal bruits.  Extremities reveal no phlebitis or edema.  Pedal pulses are good.  There is no cyanosis or clubbing.  Neurologic exam is normal strength and no lateralizing weakness.  No sensory deficits.  Integument reveals no rash  EKG today shows normal sinus rhythm at 84 per minute with occasional PVC  Assessment / Plan: 1.  Chronic frequent PVCs.  No evidence of ischemic heart disease by Myoview stress test in 2011. 2. 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 [name]. 3. history of BPH 4. low blood pressure, symptomatic, with malaise and fatigue  Plan: Trial off beta  blocker and observe response. Recheck here in 6 months for followup office visit and EKG.  If his PVCs were some, he will restart his Bystolic.

## 2014-10-28 NOTE — Assessment & Plan Note (Signed)
The patient brought in a list of his blood pressures for the past month.  At times his blood pressure is down in the 85/60 range.  When his pressure is below 100 he feels weak and lethargic.  He would like to try stopping his Bystolic altogether.  At the present time he is taking just half of a 2.5 mg tablet daily.  We will try stopping it and see if his blood pressure improves.  If his PVCs worsen he will need to go back on it.

## 2014-10-28 NOTE — Patient Instructions (Signed)
DISCONTINUE BYTOLIC, RESUME IF YOUR PVC'S RETURN  Your physician wants you to follow-up in: [place] will receive a reminder letter in the mail two months in advance. If you don't receive a letter, please call our office to schedule the follow-up appointment.

## 2014-10-28 NOTE — Assessment & Plan Note (Signed)
He continues to have sporadic PVCs.  They do not seem to bother him as much.  He is willing to have a trial off beta blocker.

## 2014-10-28 NOTE — Assessment & Plan Note (Signed)
Symptoms of xerostomia persist.  We considered Evoxac but decided not to use it because of its adverse cardiac effects.

## 2014-12-02 ENCOUNTER — Ambulatory Visit
Admission: RE | Admit: 2014-12-02 | Discharge: 2014-12-02 | Disposition: A | Discharge status: Home / Self Care | Payer: Medicare Other | Source: Ambulatory Visit | Attending: Internal Medicine | Admitting: Internal Medicine

## 2014-12-02 DIAGNOSIS — J479 Bronchiectasis, uncomplicated: Secondary | ICD-10-CM

## 2015-01-31 ENCOUNTER — Other Ambulatory Visit (HOSPITAL_COMMUNITY): Payer: Self-pay | Admitting: Internal Medicine

## 2015-01-31 DIAGNOSIS — R911 Solitary pulmonary nodule: Secondary | ICD-10-CM

## 2015-02-04 ENCOUNTER — Ambulatory Visit (HOSPITAL_COMMUNITY): Payer: Self-pay

## 2015-02-09 ENCOUNTER — Ambulatory Visit (HOSPITAL_COMMUNITY)
Admission: RE | Admit: 2015-02-09 | Discharge: 2015-02-09 | Disposition: A | Discharge status: Home / Self Care | Payer: Medicare Other | Source: Ambulatory Visit | Attending: Internal Medicine | Admitting: Internal Medicine

## 2015-02-09 DIAGNOSIS — R911 Solitary pulmonary nodule: Secondary | ICD-10-CM

## 2015-02-09 LAB — GLUCOSE, CAPILLARY: GLUCOSE-CAPILLARY: 88 mg/dL (ref 70–99)

## 2015-02-09 MED ORDER — FLUDEOXYGLUCOSE F - 18 (FDG) INJECTION
8.1000 | Freq: Once | INTRAVENOUS | Status: AC | PRN
  Administered 2015-02-09: 8.1 via INTRAVENOUS

## 2015-03-01 ENCOUNTER — Other Ambulatory Visit (HOSPITAL_COMMUNITY): Payer: Self-pay | Admitting: Internal Medicine

## 2015-03-01 DIAGNOSIS — R918 Other nonspecific abnormal finding of lung field: Secondary | ICD-10-CM

## 2015-03-03 ENCOUNTER — Other Ambulatory Visit: Payer: Self-pay | Admitting: Radiology

## 2015-03-07 ENCOUNTER — Encounter (HOSPITAL_COMMUNITY): Payer: Self-pay

## 2015-03-07 ENCOUNTER — Other Ambulatory Visit (HOSPITAL_COMMUNITY): Payer: Self-pay | Admitting: Internal Medicine

## 2015-03-07 ENCOUNTER — Ambulatory Visit (HOSPITAL_COMMUNITY)
Admission: RE | Admit: 2015-03-07 | Discharge: 2015-03-07 | Disposition: A | Discharge status: Home / Self Care | Payer: Medicare Other | Source: Ambulatory Visit | Attending: Internal Medicine | Admitting: Internal Medicine

## 2015-03-07 DIAGNOSIS — Z923 Personal history of irradiation: Secondary | ICD-10-CM | POA: Insufficient documentation

## 2015-03-07 DIAGNOSIS — K219 Gastro-esophageal reflux disease without esophagitis: Secondary | ICD-10-CM | POA: Diagnosis not present

## 2015-03-07 DIAGNOSIS — R918 Other nonspecific abnormal finding of lung field: Secondary | ICD-10-CM | POA: Diagnosis present

## 2015-03-07 DIAGNOSIS — K449 Diaphragmatic hernia without obstruction or gangrene: Secondary | ICD-10-CM | POA: Diagnosis not present

## 2015-03-07 DIAGNOSIS — I10 Essential (primary) hypertension: Secondary | ICD-10-CM | POA: Diagnosis not present

## 2015-03-07 LAB — CBC
HCT: 40.9 % (ref 39.0–52.0)
Hemoglobin: 13.6 g/dL (ref 13.0–17.0)
MCH: 30.6 pg (ref 26.0–34.0)
MCHC: 33.3 g/dL (ref 30.0–36.0)
MCV: 91.9 fL (ref 78.0–100.0)
Platelets: 194 10*3/uL (ref 150–400)
RBC: 4.45 MIL/uL (ref 4.22–5.81)
RDW: 13.6 % (ref 11.5–15.5)
WBC: 7.4 10*3/uL (ref 4.0–10.5)

## 2015-03-07 LAB — PROTIME-INR
INR: 1.03 (ref 0.00–1.49)
Prothrombin Time: 13.7 seconds (ref 11.6–15.2)

## 2015-03-07 LAB — APTT: aPTT: 36 seconds (ref 24–37)

## 2015-03-07 MED ORDER — FENTANYL CITRATE 0.05 MG/ML IJ SOLN
INTRAMUSCULAR | Status: AC
  Filled 2015-03-07: qty 4

## 2015-03-07 MED ORDER — MIDAZOLAM HCL 2 MG/2ML IJ SOLN
INTRAMUSCULAR | Status: AC
  Filled 2015-03-07: qty 4

## 2015-03-07 MED ORDER — FENTANYL CITRATE 0.05 MG/ML IJ SOLN
INTRAMUSCULAR | Status: AC | PRN
  Administered 2015-03-07: 50 ug via INTRAVENOUS

## 2015-03-07 MED ORDER — MIDAZOLAM HCL 2 MG/2ML IJ SOLN
INTRAMUSCULAR | Status: AC | PRN
  Administered 2015-03-07: 1 mg via INTRAVENOUS

## 2015-03-07 MED ORDER — LIDOCAINE HCL 1 % IJ SOLN
INTRAMUSCULAR | Status: AC
  Filled 2015-03-07: qty 20

## 2015-03-07 MED ORDER — SODIUM CHLORIDE 0.9 % IV SOLN
INTRAVENOUS | Status: DC
  Administered 2015-03-07: 10:00:00 via INTRAVENOUS

## 2015-03-07 NOTE — H&P (Signed)
Chief Complaint: "I'm here for a lung biopsy" Referring Physician:Russo HPI: Ryan Lynn is an 81 y.o. male with newly found right lung mass. He has had PET scan and is now scheduled for CT guided biopsy. Chart, PMHx, meds, imaging reviewed. Pt feels well this am, has been NPO since MN  Past Medical History:  Past Medical History  Diagnosis Date  . Hypertension   . PVC's (premature ventricular contractions)   . Hiatal hernia   . Rectal urgency   . Chest pressure   . Heart palpitations   . Aortic sclerosis   . ED (erectile dysfunction)   . Fatigue   . Cancer 1988    parotid tumor  . GERD (gastroesophageal reflux disease)   . Glaucoma     Past Surgical History:  Past Surgical History  Procedure Laterality Date  . Transthoracic echocardiogram  01/20/2010    NORMAL. EF 55-60%  . Cardiovascular stress test      NO ISCHEMIA  . Parotid gland tumor excision  1988    radiation therapy  . Inguinal hernia repair      right    Family History:  Family History  Problem Relation Age of Onset  . Heart failure Mother   . Cancer Father     Social History:  reports that he has never smoked. He has never used smokeless tobacco. He reports that he does not drink alcohol or use illicit drugs.  Allergies:  Allergies  Allergen Reactions  . Cefdinir Diarrhea  . Clindamycin/Lincomycin Diarrhea and Other (See Comments)    c diff  . Doxazosin     Low BP and light-headedness  . Doxycycline Hyclate     Dizziness and upset stomach  . Esomeprazole Magnesium Diarrhea  . Simbrinza [Brinzolamide-Brimonidine]     Low BP and light-headedness, fatigue  . Timolol     Causes low  BP and light-headedness    Medications:   Medication List    ASK your doctor about these medications        ALIGN 4 MG Caps  Take 4 mg by mouth daily.     dorzolamide 2 % ophthalmic solution  Commonly known as:  TRUSOPT  Place 1 drop into both eyes 2 (two) times daily.     latanoprost 0.005 % ophthalmic  solution  Commonly known as:  XALATAN  Place 1 drop into both eyes daily.     simethicone 125 MG chewable tablet  Commonly known as:  MYLICON  Chew 270 mg by mouth as needed.     TUMS ULTRA 1000 PO  Take 1,000 mg by mouth as needed (for indigestion).     vitamin C 500 MG tablet  Commonly known as:  ASCORBIC ACID  Take 1,000 mg by mouth daily.     VITAMIN D PO  Take 1 tablet by mouth daily.        Please HPI for pertinent positives, otherwise complete 10 system ROS negative.  Physical Exam: BP 179/84 mmHg  Pulse 82  Temp(Src) 97 F (36.1 C)  Ht 6\' 1"  (1.854 m)  Wt 162 lb (73.483 kg)  BMI 21.38 kg/m2  SpO2 100% Body mass index is 21.38 kg/(m^2).   General Appearance:  Alert, cooperative, no distress, appears stated age  Head:  Normocephalic, without obvious abnormality, atraumatic  ENT: Unremarkable  Neck: Supple, symmetrical, trachea midline  Lungs:   Clear to auscultation bilaterally, no w/r/r, respirations unlabored without use of accessory muscles.  Chest Wall:  No tenderness or deformity  Heart:  Regular rate and rhythm, S1, S2 normal, no murmur, rub or gallop.  Abdomen:   Soft, non-tender, non distended.  Extremities: Extremities normal, atraumatic, no cyanosis or edema  Pulses: 2+ and symmetric  Neurologic: Normal affect, no gross deficits.  Labs: Results for orders placed or performed during the hospital encounter of 03/07/15 (from the past 48 hour(s))  APTT upon arrival     Status: None   Collection Time: 03/07/15 10:05 AM  Result Value Ref Range   aPTT 36 24 - 37 seconds  CBC upon arrival     Status: None   Collection Time: 03/07/15 10:05 AM  Result Value Ref Range   WBC 7.4 4.0 - 10.5 K/uL   RBC 4.45 4.22 - 5.81 MIL/uL   Hemoglobin 13.6 13.0 - 17.0 g/dL   HCT 40.9 39.0 - 52.0 %   MCV 91.9 78.0 - 100.0 fL   MCH 30.6 26.0 - 34.0 pg   MCHC 33.3 30.0 - 36.0 g/dL   RDW 13.6 11.5 - 15.5 %   Platelets 194 150 - 400 K/uL  Protime-INR upon arrival      Status: None   Collection Time: 03/07/15 10:05 AM  Result Value Ref Range   Prothrombin Time 13.7 11.6 - 15.2 seconds   INR 1.03 0.00 - 1.49    Imaging: No results found.  Assessment/Plan RUL hypermetabolic mass Risks and Benefits discussed with the patient including, but not limited to bleeding, hemoptysis, respiratory failure requiring intubation, infection, pneumothorax requiring chest tube placement, stroke from air embolism or even death. All of the patient's questions were answered, patient is agreeable to proceed. Consent signed and in chart. Labs reviewed, ok  Ascencion Dike PA-C 03/07/2015, 10:50 AM

## 2015-03-07 NOTE — Discharge Instructions (Signed)
No more antibiotics needed per Dr Barbie Banner.  Follow up with Dr for CT Scan in 3 months.

## 2015-03-07 NOTE — Procedures (Signed)
The RUL nodule has shrunk dramatically No Bx FU CT in 3 mos  No comp

## 2015-03-07 NOTE — Sedation Documentation (Signed)
MD at bedside speaking with patient and wife about procedure risks and benefits. Questions answered.

## 2015-03-07 NOTE — Sedation Documentation (Signed)
MD at bedside. Dr. Barbie Banner s/w pt and wife regarding decision to not proceed w/ lung biopsy at this time d/t size of mass shrinking since last scan. Wife at side. Questions answered.

## 2015-04-21 ENCOUNTER — Encounter (HOSPITAL_COMMUNITY): Payer: Self-pay | Admitting: General Practice

## 2015-04-21 ENCOUNTER — Inpatient Hospital Stay (HOSPITAL_COMMUNITY)
Admission: AD | Admit: 2015-04-21 | Discharge: 2015-04-28 | DRG: 177 | Disposition: A | Discharge status: Home / Self Care | Payer: Medicare Other | Source: Ambulatory Visit | Attending: Internal Medicine | Admitting: Internal Medicine

## 2015-04-21 ENCOUNTER — Encounter: Payer: Self-pay | Admitting: Internal Medicine

## 2015-04-21 DIAGNOSIS — A047 Enterocolitis due to Clostridium difficile: Secondary | ICD-10-CM | POA: Diagnosis not present

## 2015-04-21 DIAGNOSIS — J189 Pneumonia, unspecified organism: Secondary | ICD-10-CM | POA: Insufficient documentation

## 2015-04-21 DIAGNOSIS — Z85818 Personal history of malignant neoplasm of other sites of lip, oral cavity, and pharynx: Secondary | ICD-10-CM

## 2015-04-21 DIAGNOSIS — J852 Abscess of lung without pneumonia: Secondary | ICD-10-CM | POA: Diagnosis not present

## 2015-04-21 DIAGNOSIS — Z888 Allergy status to other drugs, medicaments and biological substances status: Secondary | ICD-10-CM | POA: Diagnosis not present

## 2015-04-21 DIAGNOSIS — Z8619 Personal history of other infectious and parasitic diseases: Secondary | ICD-10-CM | POA: Diagnosis not present

## 2015-04-21 DIAGNOSIS — J690 Pneumonitis due to inhalation of food and vomit: Secondary | ICD-10-CM

## 2015-04-21 DIAGNOSIS — Z66 Do not resuscitate: Secondary | ICD-10-CM | POA: Diagnosis present

## 2015-04-21 DIAGNOSIS — R0602 Shortness of breath: Secondary | ICD-10-CM | POA: Diagnosis present

## 2015-04-21 DIAGNOSIS — E440 Moderate protein-calorie malnutrition: Secondary | ICD-10-CM | POA: Diagnosis not present

## 2015-04-21 DIAGNOSIS — J851 Abscess of lung with pneumonia: Principal | ICD-10-CM | POA: Diagnosis present

## 2015-04-21 DIAGNOSIS — R1314 Dysphagia, pharyngoesophageal phase: Secondary | ICD-10-CM | POA: Diagnosis present

## 2015-04-21 DIAGNOSIS — Z923 Personal history of irradiation: Secondary | ICD-10-CM | POA: Diagnosis not present

## 2015-04-21 DIAGNOSIS — I493 Ventricular premature depolarization: Secondary | ICD-10-CM | POA: Diagnosis present

## 2015-04-21 DIAGNOSIS — Y95 Nosocomial condition: Secondary | ICD-10-CM | POA: Diagnosis present

## 2015-04-21 DIAGNOSIS — B370 Candidal stomatitis: Secondary | ICD-10-CM | POA: Diagnosis not present

## 2015-04-21 DIAGNOSIS — I10 Essential (primary) hypertension: Secondary | ICD-10-CM | POA: Diagnosis present

## 2015-04-21 DIAGNOSIS — K210 Gastro-esophageal reflux disease with esophagitis: Secondary | ICD-10-CM | POA: Diagnosis not present

## 2015-04-21 DIAGNOSIS — K219 Gastro-esophageal reflux disease without esophagitis: Secondary | ICD-10-CM | POA: Diagnosis not present

## 2015-04-21 DIAGNOSIS — B9689 Other specified bacterial agents as the cause of diseases classified elsewhere: Secondary | ICD-10-CM | POA: Diagnosis not present

## 2015-04-21 DIAGNOSIS — R918 Other nonspecific abnormal finding of lung field: Secondary | ICD-10-CM | POA: Diagnosis not present

## 2015-04-21 DIAGNOSIS — Z881 Allergy status to other antibiotic agents status: Secondary | ICD-10-CM | POA: Diagnosis not present

## 2015-04-21 DIAGNOSIS — Z6820 Body mass index (BMI) 20.0-20.9, adult: Secondary | ICD-10-CM | POA: Diagnosis not present

## 2015-04-21 DIAGNOSIS — J9690 Respiratory failure, unspecified, unspecified whether with hypoxia or hypercapnia: Secondary | ICD-10-CM

## 2015-04-21 HISTORY — DX: Unspecified glaucoma: H40.9

## 2015-04-21 HISTORY — DX: Malignant neoplasm of parotid gland: C07

## 2015-04-21 HISTORY — DX: Pneumonia, unspecified organism: J18.9

## 2015-04-21 LAB — COMPREHENSIVE METABOLIC PANEL
ALT: 24 U/L (ref 0–53)
AST: 28 U/L (ref 0–37)
Albumin: 3 g/dL — ABNORMAL LOW (ref 3.5–5.2)
Alkaline Phosphatase: 92 U/L (ref 39–117)
Anion gap: 10 (ref 5–15)
BUN: 16 mg/dL (ref 6–23)
CO2: 26 mmol/L (ref 19–32)
Calcium: 8.8 mg/dL (ref 8.4–10.5)
Chloride: 98 mmol/L (ref 96–112)
Creatinine, Ser: 0.82 mg/dL (ref 0.50–1.35)
GFR calc Af Amer: >90 mL/min (ref >90)
GFR calc non Af Amer: 82 mL/min — ABNORMAL LOW (ref >90)
Glucose, Bld: 118 mg/dL — ABNORMAL HIGH (ref 70–99)
Potassium: 4.4 mmol/L (ref 3.5–5.1)
Sodium: 134 mmol/L — ABNORMAL LOW (ref 135–145)
Total Bilirubin: 0.4 mg/dL (ref 0.3–1.2)
Total Protein: 7.1 g/dL (ref 6.0–8.3)

## 2015-04-21 LAB — CBC
HCT: 37.5 % — ABNORMAL LOW (ref 39.0–52.0)
Hemoglobin: 12.4 g/dL — ABNORMAL LOW (ref 13.0–17.0)
MCH: 30 pg (ref 26.0–34.0)
MCHC: 33.1 g/dL (ref 30.0–36.0)
MCV: 90.6 fL (ref 78.0–100.0)
PLATELETS: 347 10*3/uL (ref 150–400)
RBC: 4.14 MIL/uL — ABNORMAL LOW (ref 4.22–5.81)
RDW: 13.7 % (ref 11.5–15.5)
WBC: 12 10*3/uL — AB (ref 4.0–10.5)

## 2015-04-21 MED ORDER — CALCIUM CARBONATE ANTACID 1000 MG PO CHEW: 1000.0000 mg | CHEWABLE_TABLET | ORAL | Status: DC | PRN

## 2015-04-21 MED ORDER — HEPARIN SODIUM (PORCINE) 5000 UNIT/ML IJ SOLN
5000.0000 [IU] | Freq: Three times a day (TID) | INTRAMUSCULAR | Status: DC
  Administered 2015-04-22 – 2015-04-28 (×16): 5000 [IU] via SUBCUTANEOUS
  Filled 2015-04-21 (×22): qty 1

## 2015-04-21 MED ORDER — ALUM & MAG HYDROXIDE-SIMETH 200-200-20 MG/5ML PO SUSP: 30.0000 mL | Freq: Four times a day (QID) | ORAL | Status: DC | PRN

## 2015-04-21 MED ORDER — ALIGN 4 MG PO CAPS: 4.0000 mg | ORAL_CAPSULE | Freq: Two times a day (BID) | ORAL | Status: DC

## 2015-04-21 MED ORDER — ACETAMINOPHEN 325 MG PO TABS
650.0000 mg | ORAL_TABLET | Freq: Four times a day (QID) | ORAL | Status: DC | PRN
  Filled 2015-04-21: qty 2

## 2015-04-21 MED ORDER — LATANOPROST 0.005 % OP SOLN
1.0000 [drp] | Freq: Every day | OPHTHALMIC | Status: DC
  Administered 2015-04-21 – 2015-04-27 (×7): 1 [drp] via OPHTHALMIC
  Filled 2015-04-21: qty 2.5

## 2015-04-21 MED ORDER — ALBUTEROL SULFATE (2.5 MG/3ML) 0.083% IN NEBU: 2.5000 mg | INHALATION_SOLUTION | RESPIRATORY_TRACT | Status: DC | PRN

## 2015-04-21 MED ORDER — CALCIUM CARBONATE ANTACID 500 MG PO CHEW: 400.0000 mg | CHEWABLE_TABLET | ORAL | Status: DC | PRN

## 2015-04-21 MED ORDER — ONDANSETRON HCL 4 MG/2ML IJ SOLN
4.0000 mg | Freq: Four times a day (QID) | INTRAMUSCULAR | Status: DC | PRN
  Administered 2015-04-26: 4 mg via INTRAVENOUS
  Filled 2015-04-21: qty 2

## 2015-04-21 MED ORDER — ENSURE ENLIVE PO LIQD: 237.0000 mL | Freq: Two times a day (BID) | ORAL | Status: DC

## 2015-04-21 MED ORDER — SACCHAROMYCES BOULARDII 250 MG PO CAPS
250.0000 mg | ORAL_CAPSULE | Freq: Two times a day (BID) | ORAL | Status: DC
  Administered 2015-04-22 – 2015-04-28 (×12): 250 mg via ORAL
  Filled 2015-04-21 (×15): qty 1

## 2015-04-21 MED ORDER — VANCOMYCIN HCL 10 G IV SOLR
1250.0000 mg | INTRAVENOUS | Status: AC
  Administered 2015-04-21: 1250 mg via INTRAVENOUS
  Filled 2015-04-21: qty 1250

## 2015-04-21 MED ORDER — DORZOLAMIDE HCL 2 % OP SOLN
1.0000 [drp] | Freq: Two times a day (BID) | OPHTHALMIC | Status: DC
  Administered 2015-04-21 – 2015-04-28 (×13): 1 [drp] via OPHTHALMIC
  Filled 2015-04-21 (×2): qty 10

## 2015-04-21 MED ORDER — DEXTROSE 5 % IV SOLN
2.0000 g | INTRAVENOUS | Status: AC
  Administered 2015-04-21: 2 g via INTRAVENOUS
  Filled 2015-04-21: qty 2

## 2015-04-21 MED ORDER — SIMETHICONE 80 MG PO CHEW
80.0000 mg | CHEWABLE_TABLET | Freq: Four times a day (QID) | ORAL | Status: DC | PRN
  Filled 2015-04-21 (×2): qty 1

## 2015-04-21 NOTE — H&P (Signed)
[facility] History and Physical                                                                                    Ryan Lynn, is a 81 y.o. male  MRN: [id number]   DOB - [birth year]  Admit Date - 04/21/2015  Outpatient Primary MD for the patient is [name], MD  Referring Physician:  Dr. Virgina Jock, PCP  Chief Complaint:   SOB, Fever, Cough  HPI  Ryan Lynn  is a 81 y.o. male, retired Horticulturist, commercial, who has a past medical history of parotid cancer, recurrent aspiration, heart palpitations, GERD, hypertension, and a severe episode of C. difficile. He was sent for direct admission today from his primary care physician's office due to recurrent fevers, shortness of breath and an abnormal CT scan. Ryan Lynn gives an excellent history and states that he was having fever and cough and started Levaquin on March 14 for 7 days. His symptoms improved for approximately 2 weeks but then came back. He has completed his second course of Levaquin but is still having dry cough with occasional dark brown sputum, and low-grade fever (as high as 101). Ryan Lynn states his normal temperature is 97.6. An outpatient CT scan of his chest showed dense consolidation consistent with right upper lobe obstructive pneumonia with abscess formation. Dr. Virgina Jock recommends pulmonary consultation and possibly CVT S consultation.  Ryan Lynn has had pulmonary nodules in the past. These have been followed closely with quarterly CT scans. A CT scan done in December 2015 showed a 6 mm nodule in his right upper lobe. A PET scan was done in February as the nodule was appearing to rapidly progress and there was concern for lung cancer. A biopsy of the node was scheduled in March but an updated CT on March 7 revealed that the node had decreased in size significantly. Therefore the biopsy was not done.  Review of Systems   In addition to the HPI above,  No dizziness or difficulty ambulating. No Headache, No changes with  Vision or hearing, No problems swallowing food or Liquids, other than his chronic problems with continued aspiration of food. Positive for chronic heart palpitations "since birth" No Abdominal pain, No Nausea or Vomiting, Bowel movements are regular, No Blood in stool or Urine, No dysuria, No new skin rashes or bruises, No new joints pains-aches,  No new weakness, tingling, numbness in any extremity, No recent weight gain or loss, A full 10 point Review of Systems was done, except as stated above, all other Review of Systems were negative.  Past Medical History  Past Medical History  Diagnosis Date  . Hypertension   . PVC's (premature ventricular contractions)   . Rectal urgency   . Chest pressure   . Heart palpitations   . Aortic sclerosis   . ED (erectile dysfunction)   . Fatigue   . GERD (gastroesophageal reflux disease)   . Cancer of parotid gland 1988  . Glaucoma, bilateral     "severe right; moderate left" (04/21/2015)  . Pneumonia 1930's; 04/21/2015    Past Surgical History  Procedure Laterality Date  . Transthoracic echocardiogram  01/20/2010  NORMAL. EF 55-60%  . Cardiovascular stress test      NO ISCHEMIA  . Parotid gland tumor excision  1988    radiation therapy  . Tonsillectomy  1943  . Inguinal hernia repair Right 1999  . Glaucoma surgery Bilateral "several"    "laser"      Social History History  Substance Use Topics  . Smoking status: Never Smoker   . Smokeless tobacco: Never Used  . Alcohol Use: No   lives at home with his wife. Is ambulatory. Is independent with ADLs.  Family History Family History  Problem Relation Age of Onset  . Heart failure Mother   . Cancer Father    both his father and brother had a history of lung cancer. His father is deceased. His brother is status post lobectomy.  Prior to Admission medications   Medication Sig Start Date End Date Taking? Authorizing Provider  Calcium Carbonate Antacid (TUMS ULTRA 1000 PO)  Take 1,000 mg by mouth as needed (for indigestion).     Historical Provider, MD  Cholecalciferol (VITAMIN D PO) Take 1 tablet by mouth daily.    Historical Provider, MD  dorzolamide (TRUSOPT) 2 % ophthalmic solution Place 1 drop into both eyes 2 (two) times daily.  06/02/12   Historical Provider, MD  latanoprost (XALATAN) 0.005 % ophthalmic solution Place 1 drop into both eyes daily.  09/29/13   Historical Provider, MD  Probiotic Product (ALIGN) 4 MG CAPS Take 4 mg by mouth daily.     Historical Provider, MD  simethicone (MYLICON) 426 MG chewable tablet Chew 125 mg by mouth as needed.     Historical Provider, MD  vitamin C (ASCORBIC ACID) 500 MG tablet Take 1,000 mg by mouth daily.     Historical Provider, MD    Allergies  Allergen Reactions  . Cefdinir Diarrhea  . Clindamycin/Lincomycin Diarrhea and Other (See Comments)    c diff  . Doxazosin     Low BP and light-headedness  . Doxycycline Hyclate     Dizziness and upset stomach  . Esomeprazole Magnesium Diarrhea  . Simbrinza [Brinzolamide-Brimonidine]     Low BP and light-headedness, fatigue  . Timolol     Causes low  BP and light-headedness    Physical Exam  Vitals  Blood pressure 142/77, pulse 103, temperature 98.4 F (36.9 C), temperature source Oral, resp. rate 20, SpO2 100 %.   General:  Well-developed, thin, pleasant male.  Standing at his sink brushing his teeth, wife at bedside.  Psych:  Normal affect and insight, Not Suicidal or Homicidal, Awake Alert, Oriented X 3.  Neuro:   No F.N deficits, ALL C.Nerves Intact, Strength 5/5 all 4 extremities, Sensation intact all 4 extremities.  ENT:  Ears and Eyes appear Normal, Conjunctivae clear, PER. Moist oral mucosa without erythema or exudates.  Neck:  Supple, No lymphadenopathy appreciated  Respiratory:  Symmetrical chest wall movement, Good air movement bilaterally, CTAB.  Cardiac: Irregularly irregular, No Murmurs, no LE edema noted, no JVD.    Abdomen:  Positive  bowel sounds, Soft, Non tender, Non distended,  No masses appreciated  Skin:  No Cyanosis, Normal Skin Turgor, No Skin Rash or Bruise.  Extremities:  Able to move all 4. 5/5 strength in each,  no effusions.  Data Review  CBC  Recent Labs Lab 04/21/15 1644  WBC 12.0*  HGB 12.4*  HCT 37.5*  PLT 347  MCV 90.6  MCH 30.0  MCHC 33.1  RDW 13.7    Chemistries  Pending  Imaging results:   No results found.  My personal review of EKG: Pending   Assessment & Plan  Principal Problem:   Obstructive pneumonia Active Problems:   GERD   Dysphagia, pharyngoesophageal phase   PVC's (premature ventricular contractions)   Lung abscess   History of Clostridium difficile infection  Postobstructive pneumonia with possible right upper lobe abscess Seen on outpatient CT scan. Will start cefepime and vancomycin per pharmacy. Have asked pulmonology to consult. They will see the patient on 4/22.  Recurrent aspiration with dysphagia Secondary to history of parotid cancer status post surgery. Patient has been working with this for the past 20 years. His ENT follows him outpatient.  PVCs with a history of chronic palpitations. Rhythms sounded abnormal on cardiac auscultation Will check an EKG. Patient denies any chest pain  GERD Given history of C. difficile will not order PPI. Will order Tums as patient takes at home.  Hypertension Currently stable I do not see any home blood pressure medications listed. Pharmacy has not had a chance to reconcile home medications. Please recheck pain medications after pharmacy has reconciled them   Consultants Called:  Pulmonolgy  Family Communication:   Wife at bedside  Code Status:  DNR  Condition:  stable  Potential Disposition:  To home when appropriate.  Likely 3-4 days.  Time spent in minutes : [address]  PA-C on 04/21/2015 at 6:17 PM Between 7am to 7pm - Pager - [phone] After 7pm go to [email or web address] -  password [email or web address] And look for the night coverage person covering me after hours  [facility]

## 2015-04-21 NOTE — Progress Notes (Signed)
ANTIBIOTIC CONSULT NOTE - INITIAL  Pharmacy Consult for Cefepime/Vanco Indication: PNA with possible abscess/empyema  Allergies  Allergen Reactions  . Cefdinir Diarrhea  . Clindamycin/Lincomycin Diarrhea and Other (See Comments)    c diff  . Doxazosin     Low BP and light-headedness  . Doxycycline Hyclate     Dizziness and upset stomach  . Esomeprazole Magnesium Diarrhea  . Simbrinza [Brinzolamide-Brimonidine]     Low BP and light-headedness, fatigue  . Timolol     Causes low  BP and light-headedness    Patient Measurements:   Adjusted Body Weight:    Vital Signs:   Intake/Output from previous day:   Intake/Output from this shift:    Labs: No results for input(s): WBC, HGB, PLT, LABCREA, CREATININE in the last 72 hours. CrCl cannot be calculated (Unknown ideal weight.). No results for input(s): VANCOTROUGH, VANCOPEAK, VANCORANDOM, GENTTROUGH, GENTPEAK, GENTRANDOM, TOBRATROUGH, TOBRAPEAK, TOBRARND, AMIKACINPEAK, AMIKACINTROU, AMIKACIN in the last 72 hours.   Microbiology: No results found for this or any previous visit (from the past 720 hour(s)).  Medical History: Past Medical History  Diagnosis Date  . Hypertension   . PVC's (premature ventricular contractions)   . Hiatal hernia   . Rectal urgency   . Chest pressure   . Heart palpitations   . Aortic sclerosis   . ED (erectile dysfunction)   . Fatigue   . Cancer 1988    parotid tumor  . GERD (gastroesophageal reflux disease)   . Glaucoma     Medications:  Prescriptions prior to admission  Medication Sig Dispense Refill Last Dose  . Calcium Carbonate Antacid (TUMS ULTRA 1000 PO) Take 1,000 mg by mouth as needed (for indigestion).    03/06/2015 at Unknown time  . Cholecalciferol (VITAMIN D PO) Take 1 tablet by mouth daily.   03/06/2015 at Unknown time  . dorzolamide (TRUSOPT) 2 % ophthalmic solution Place 1 drop into both eyes 2 (two) times daily.    03/07/2015 at Unknown time  . latanoprost (XALATAN) 0.005 %  ophthalmic solution Place 1 drop into both eyes daily.    03/07/2015 at Unknown time  . Probiotic Product (ALIGN) 4 MG CAPS Take 4 mg by mouth daily.    03/06/2015 at Unknown time  . simethicone (MYLICON) 196 MG chewable tablet Chew 125 mg by mouth as needed.    Past Week at Unknown time  . vitamin C (ASCORBIC ACID) 500 MG tablet Take 1,000 mg by mouth daily.    03/06/2015 at Unknown time   Assessment: 81 y/o M presented 4/15 with cough, congestion, fever and was placed on Levaquin by primary MD. Now 4/21 pt continues to have intermittent fever and feels bad. Patient has RUL lung nodules. CT: Dense consolidatin Ant RUL c/Obstructive PNA c Abscess formation. Focal Pneumonitis, Adenopathy, Small Effusion. Dose Vanco/Cefepime for CrCl 75  Goal of Therapy:  Vancomycin trough level 15-20 mcg/ml  Plan:  Cefepime 2g IV x 1 then every 8 hr. Vanco 1250mg  IV x 1, then 1g IV q12hrs. Trough at steady state.   Geremiah Fussell S. Alford Highland, PharmD, BCPS Clinical Staff Pharmacist Pager [phone]  Eilene Ghazi Stillinger 04/21/2015,3:44 PM

## 2015-04-21 NOTE — Progress Notes (Signed)
Patient arrived to floor accompanied by wife. Direct admit.  Placed on droplet based on history of cough and fever. Skin bruised but intact.  MD notified.

## 2015-04-21 NOTE — H&P (Signed)
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  CT 10/22/13 1. Left vocal cord deviation without evidence for a significant lesion along the expected course of the recurrent laryngeal nerve.  2. Mild interval increase in size of the lymph nodes within the AP window below the aortic arch.  3. Status post left parotidectomy with postradiation changes of the left neck as described.   PET scan 11/11/13. 1) Mild metabolic activity associated spiculated nodule in the right upper lobe is indeterminate. Recommend short-term follow-up CT (1 to 3 months). If lesion increase in size then recommend tissue sampling.  2. No evidence of local salivary gland tumor recurrence in the neck.  3. Fracture of the lateral aspect of the left clavicle. Recommend correlation with trauma. Cannot exclude pathologic fracture but less  favored.   CT in 02/3014 Stable 6.5 mm RUL Nodule  Chronic Inflam changes.  Ectatic Aorta.   CT scan 11/2014 showed a Stable 6.5 mm RUL Nodule. Chronic Inflam changes. Ectatic Aorta. Some Bronchiectasis  Strangely a new 9 x 9 mm new nodular opacity was seen in RLL. The Nodule does not specifically correlate with a cancer but since it is a new nodule we need to take it seriously. After going back and forth as to what to do with it, we decided CT/PET  1. Considerable progression of masslike opacity in the right upper lobe, with some additional scattered nodules in the right lung which  are hypermetabolic. Mildly enlarged hypermetabolic right paratracheal and right hilar adenopathy. Given the rapid progression, possibilities include infectious granulomatous process such as tuberculosis, or rapidly advancing lung cancer. Correlate with  clinical symptoms and consider biopsy.   We set up IR Bx of RUL Nodule but not done as repeat CT showed:  The large right upper lobe mass has markedly regressed in size supporting inflammatory etiology, such as fungal infection. Some of the reticulonodular opacities in the left lower lobe have increased. The patient denies any fever. He did have a productive cough over the last month and this has improved. Antibiotics are not indicated at this time. He was instructed to contact his primary care physician if his cough or any new symptoms persisted beyond 1 week. Otherwise, he is to follow-up with a CT scan in 3 months to ensure resolution.  He was completely Asxatic at that time.  On 4/15 he came in c cough congestion and fever. WBC 15 Placed on Levaquin 500 mg PO daily  He came back today and still intermittent fevers and not feeling well. CT ordered and showed:  Dense consolidatin Ant RUL c/Obstructive PNA c Abscess formation. Focal Pneumonitis. Adenopathy. Small Effusion. Will admit for IV ABx, ? Chest tube, Pulm Consult and ? need for CVTS surgical eval/VATS?  I called Hospitalists and we got him a bed   GLUCOSE              [H]  144 mg/dl                   60-110   BUN                       16 mg/dl  5-23   CREATININE                0.8 mg/dl                   0.3-1.5  eGFR Non-African American                             93.3  eGFR African American                             112.8   SODIUM                    139 mEq/L                   135-148   POTASSIUM            [H]  5.6 mEq/L                   3.5-5.3   CHLORIDE                  103 mEq/L                   80-111   CO2                       30 mEq/L                    15-35   CALCIUM                   9.1 mg/dL                   7.0-10.5  Tests: (2) CBC (2000)   WBC                  [H]  11.40 K/uL                  4.10-10.90   LYM                       1.1 K/uL                    0.6-4.1 ! MID                        0.9 K/uL                    0.0-1.8   GRAN                 [H]  9.4 K/uL                    2.0-7.8   LYM%                 [L]  9.4 %                       10.0-58.5 ! MID%                      8.0 %                       0.1-24.0   GRAN%  82.6                        37.0-92.0   RBC                  [L]  4.0 M/uL                    4.2-6.3   HGB                       12.3 g/dL                   12.0-18.0   HCT                       38.0 %                      37.0-51.0   MCV                       96.2 fL                     80.0-97.0   MCH                       31.1 pg                     26.0-32.0   MCHC                      32.4 g/dL                   31.0-36.0   PLT                       362 K/uL                    140-440  still c Leukocytosis and L Shift. Cr fine.

## 2015-04-21 NOTE — Consult Note (Signed)
Name: Ryan Lynn MRN: [id number] DOB: 17-Feb-1934    ADMISSION DATE:  04/21/2015 CONSULTATION DATE:  04/21/2015  REFERRING MD :  Karleen Hampshire  CHIEF COMPLAINT:  Cough, Fever  BRIEF PATIENT DESCRIPTION: 81 y.o. M brought to [facility] 4/21 as direct admit from PCP office for ongoing fevers, cough, abnormal CT scan.  CT scan revealed obstructive PNA with possible abscess formation.  PCCM consulted for recs.  SIGNIFICANT EVENTS  4/21 - admit  STUDIES:  CT Chest 4/21 >>> RUL obstructive PNA with possible abscess formation (reported so, official read unable to be loaded on hospital computers).   HISTORY OF PRESENT ILLNESS:  Ryan Lynn is a 81 y.o. M with PMH as outlined below.  He was sent to [facility] on 4/21 as a direct admit from his PCP's office (Dr. Virgina Jock) due to recurrent fevers, SOB, and abnormal outpatient CT scan that showed RUL obstructive PNA with possible abscess formation.  PCCM was consulted for further recs.  He states that starting in middle of March, he began to have cough and fevers for which he was placed on 7 day course of Levaquin.  Symptoms improved for 2 weeks before returning.  He was placed back on Levaquin; however, since then, has had persisted cough that for the most part has been completely dry (has had very minimal sputum production), low grade fevers (though once got to 101).  He denies any chills, wheezing, N/V/D, abd pain, myalgias, fatigue.  He has had occasional pleuritic chest pain associated with coughing and deep breathing only, located on right upper chest.  Has also had 1 or 2 nights where he has experienced night sweats.  He also has hx of pulmonary nodules that have been followed by serial CT scans.  CT from December 2015 showed 27mm nodule in RUL.  PET was obtained in Feb 2016 and showed dramatic increase in the size of confluent mass like density in the RUL, hypermetabolic nodules and lymph nodes in the mediastinum.  Follow up CT scan in March 2016 showed marked regression in size  of RUL nodule which supported inflammatory origin; therefore, biopsy was deferred.  PAST MEDICAL HISTORY :   has a past medical history of Hypertension; PVC's (premature ventricular contractions); Rectal urgency; Chest pressure; Heart palpitations; Aortic sclerosis; ED (erectile dysfunction); Fatigue; GERD (gastroesophageal reflux disease); Cancer of parotid gland (1988); Glaucoma, bilateral; and Pneumonia (1930's; 04/21/2015).  has past surgical history that includes transthoracic echocardiogram (01/20/2010); Cardiovascular stress test; Parotid gland tumor excision (1988); Tonsillectomy (1943); Inguinal hernia repair (Right, 1999); and Glaucoma surgery (Bilateral, "several"). Prior to Admission medications   Medication Sig Start Date End Date Taking? Authorizing Provider  Calcium Carbonate Antacid (TUMS ULTRA 1000 PO) Take 1,000 mg by mouth as needed (for indigestion).     Historical Provider, MD  Cholecalciferol (VITAMIN D PO) Take 1 tablet by mouth daily.    Historical Provider, MD  dorzolamide (TRUSOPT) 2 % ophthalmic solution Place 1 drop into both eyes 2 (two) times daily.  06/02/12   Historical Provider, MD  latanoprost (XALATAN) 0.005 % ophthalmic solution Place 1 drop into both eyes daily.  09/29/13   Historical Provider, MD  Probiotic Product (ALIGN) 4 MG CAPS Take 4 mg by mouth daily.     Historical Provider, MD  simethicone (MYLICON) 409 MG chewable tablet Chew 125 mg by mouth as needed.     Historical Provider, MD  vitamin C (ASCORBIC ACID) 500 MG tablet Take 1,000 mg by mouth daily.     Historical Provider, MD  Allergies  Allergen Reactions  . Cefdinir Diarrhea  . Clindamycin/Lincomycin Diarrhea and Other (See Comments)    c diff  . Doxazosin     Low BP and light-headedness  . Doxycycline Hyclate     Dizziness and upset stomach  . Esomeprazole Magnesium Diarrhea  . Simbrinza [Brinzolamide-Brimonidine]     Low BP and light-headedness, fatigue  . Timolol     Causes low  BP and  light-headedness    FAMILY HISTORY:  family history includes Cancer in his father; Heart failure in his mother. SOCIAL HISTORY:  reports that he has never smoked. He has never used smokeless tobacco. He reports that he does not drink alcohol or use illicit drugs.  REVIEW OF SYSTEMS:  All negative; except for those that are bolded, which indicate positives.  Constitutional: weight loss, weight gain, night sweats, fevers, chills, fatigue, weakness.  HEENT: headaches, sore throat, sneezing, nasal congestion, post nasal drip, difficulty swallowing, tooth/dental problems, visual complaints, visual changes, ear aches. Neuro: difficulty with speech, weakness, numbness, ataxia. CV:  Pleuritic chest pain, orthopnea, PND, swelling in lower extremities, dizziness, palpitations, syncope.  Resp: cough, hemoptysis, dyspnea, wheezing. GI  heartburn, indigestion, abdominal pain, nausea, vomiting, diarrhea, constipation, change in bowel habits, loss of appetite, hematemesis, melena, hematochezia.  GU: dysuria, change in color of urine, urgency or frequency, flank pain, hematuria. MSK: joint pain or swelling, decreased range of motion. Psych: change in mood or affect, depression, anxiety, suicidal ideations, homicidal ideations. Skin: rash, itching, bruising.   SUBJECTIVE:  SOB is not bad.  Main issue is cough now.  VITAL SIGNS: Temp:  [98.4 F (36.9 C)] 98.4 F (36.9 C) (04/21 1545) Pulse Rate:  [103] 103 (04/21 1545) Resp:  [20] 20 (04/21 1545) BP: (142)/(77) 142/77 mmHg (04/21 1545) SpO2:  [100 %] 100 % (04/21 1545)  PHYSICAL EXAMINATION: General: Elderly male, walking around in room, in NAD. Neuro: A&O x 3, non-focal.  HEENT: [place]/AT. PERRL, sclerae anicteric. Cardiovascular: RRR, no M/R/G.  Lungs: Respirations even and unlabored.  Faint rhonchi RUL otherwise CTA. Abdomen: BS x 4, soft, NT/ND.  Musculoskeletal: No gross deformities, no edema.  Skin: Intact, warm, no rashes.     Recent  Labs Lab 04/21/15 1644  NA 134*  K 4.4  CL 98  CO2 26  BUN 16  CREATININE 0.82  GLUCOSE 118*    Recent Labs Lab 04/21/15 1644  HGB 12.4*  HCT 37.5*  WBC 12.0*  PLT 347   No results found.  ASSESSMENT / PLAN:  HCAP ? Lung abscess - not able to pull up official radiology read and disk with CT scan not too impressive (had trouble opening this as well) Recs: Continue empiric abx. Follow cultures. Albuterol PRN. No role steroids. Pulmonary hygiene. CXR in AM.   Montey Hora, PA - [facility] [place] [facility] Pager: [phone]  or [phone]  I reviewed chest CT myself, difficult to fully visualize a source of obstruction but abscess definitely present.  81 year old male with PMH as above presenting with a lung abscess vs post obstructive PNA.  Difficult to ascertain from CT sent with patient.  New HCAP: previous abx exposure.  - Continue broad spectrum abx.  - F/U on culture.  - PCT protocol ordered.  New lung abscess: I see no air-fluid level that I can appreciate on CT (poor quality and hard to visualize).  - Repeat CT without contrast on Sunday.  - F/U on CT.  -  Abx via cefepime and vancomycin.  ?of endobronchial lesion: extremely difficult to visualize on current CT.  - Allow treatment with abx for 3-5 days.  - Repeat CT on Sunday then will re-evaluate on Monday for potential bronch.  Patient seen and examined, agree with above note.  I dictated the care and orders written for this patient under my direction.  Rush Farmer, M.D. [facility] Pulmonary/Critical Care Medicine. Pager: [phone]. After hours pager: [phone].  04/21/2015, 7:14 PM

## 2015-04-22 ENCOUNTER — Inpatient Hospital Stay (HOSPITAL_COMMUNITY): Payer: Medicare Other

## 2015-04-22 DIAGNOSIS — J189 Pneumonia, unspecified organism: Secondary | ICD-10-CM | POA: Insufficient documentation

## 2015-04-22 DIAGNOSIS — I493 Ventricular premature depolarization: Secondary | ICD-10-CM

## 2015-04-22 DIAGNOSIS — R918 Other nonspecific abnormal finding of lung field: Secondary | ICD-10-CM | POA: Insufficient documentation

## 2015-04-22 DIAGNOSIS — R1314 Dysphagia, pharyngoesophageal phase: Secondary | ICD-10-CM

## 2015-04-22 DIAGNOSIS — E440 Moderate protein-calorie malnutrition: Secondary | ICD-10-CM | POA: Insufficient documentation

## 2015-04-22 DIAGNOSIS — J852 Abscess of lung without pneumonia: Secondary | ICD-10-CM | POA: Insufficient documentation

## 2015-04-22 DIAGNOSIS — K210 Gastro-esophageal reflux disease with esophagitis: Secondary | ICD-10-CM

## 2015-04-22 LAB — LEGIONELLA ANTIGEN, URINE

## 2015-04-22 LAB — INFLUENZA PANEL BY PCR (TYPE A & B)
H1N1 flu by pcr: NOT DETECTED
Influenza A By PCR: NEGATIVE
Influenza B By PCR: NEGATIVE

## 2015-04-22 LAB — STREP PNEUMONIAE URINARY ANTIGEN: Strep Pneumo Urinary Antigen: NEGATIVE

## 2015-04-22 MED ORDER — VANCOMYCIN HCL IN DEXTROSE 1-5 GM/200ML-% IV SOLN
1000.0000 mg | Freq: Two times a day (BID) | INTRAVENOUS | Status: DC
  Administered 2015-04-22 – 2015-04-26 (×10): 1000 mg via INTRAVENOUS
  Filled 2015-04-22 (×12): qty 200

## 2015-04-22 MED ORDER — GUAIFENESIN ER 600 MG PO TB12
600.0000 mg | ORAL_TABLET | Freq: Two times a day (BID) | ORAL | Status: DC
  Administered 2015-04-23 – 2015-04-28 (×11): 600 mg via ORAL
  Filled 2015-04-22 (×13): qty 1

## 2015-04-22 MED ORDER — DEXTROSE 5 % IV SOLN
2.0000 g | Freq: Three times a day (TID) | INTRAVENOUS | Status: DC
  Administered 2015-04-22 – 2015-04-27 (×15): 2 g via INTRAVENOUS
  Filled 2015-04-22 (×17): qty 2

## 2015-04-22 MED ORDER — BOOST PLUS PO LIQD
237.0000 mL | Freq: Every day | ORAL | Status: DC
  Administered 2015-04-23 – 2015-04-26 (×3): 237 mL via ORAL
  Filled 2015-04-22 (×8): qty 237

## 2015-04-22 NOTE — Progress Notes (Signed)
[facility] PROGRESS NOTE  Daron Stutz BTD:[id number] DOB: [birth year] DOA: 04/21/2015 PCP: [name], MD  Assessment/Plan: Postobstructive pneumonia with possible right upper lobe abscess Seen on outpatient CT scan. Will start cefepime and vancomycin per pharmacy. Per PCCM rec's will treat with broad spectrum antibiotics until 4/24; then will repeat CT scan and if no changes or worsening, then will need bronchoscopy. Continue mucinex and flutter valve  Recurrent aspiration with dysphagia Secondary to history of parotid cancer status post surgery and radiation. Patient has been working with this for the past 20 years with ENT (Dr. Lucia Gaskins). Following rec's from PCCM will change to diet to dysphagia 3 to minimize asp risks He will continue follow up ENT as an outpatient.  Moderate protein calorie malnutrition Dietitian has been consulted Will follow rec's for feeding supplements   PVCs with a history of chronic palpitations. Sinus rhythm on telemetry over 24 hours Will d/c telemetry Rate controlled  GERD Given history of C. difficile will not order PPI. Will continue the use of tums and PRN simethicone  Hypertension -Currently stable -Patient managing with diet control -Will monitor  Code Status: DNR Family Communication: wife at bedside Disposition Plan: remains inpatient   Consultants:  PCCM (Dr. Nelda Marseille)  Procedures:  See below for x-ray reports   Antibiotics:  Vancomycin 4/21  Cefepime see b  HPI/Subjective: Afebrile currently, no CP and without significant SOB according to patient.   Objective: Filed Vitals:   04/22/15 0900  BP: 106/48  Pulse: 103  Temp: 97.5 F (36.4 C)  Resp: 18    Intake/Output Summary (Last 24 hours) at 04/22/15 1518 Last data filed at 04/22/15 0900  Gross per 24 hour  Intake    850 ml  Output    304 ml  Net    546 ml   Filed Weights   04/21/15 2027  Weight: 72.031 kg (158 lb 12.8 oz)    Exam:   General:   Currently afebrile, no CP and reports no significant SOB. Chronically hoarse   Cardiovascular:S1 and S2, no rubs or gallops  Respiratory: no wheezing; but with positive rhonchi and fair air movement   Abdomen: soft, NT, ND, positive BS  Musculoskeletal: no edema, no cyanosis or clubbing  Data Reviewed: Basic Metabolic Panel:  Recent Labs Lab 04/21/15 1644  NA 134*  K 4.4  CL 98  CO2 26  GLUCOSE 118*  BUN 16  CREATININE 0.82  CALCIUM 8.8   Liver Function Tests:  Recent Labs Lab 04/21/15 1644  AST 28  ALT 24  ALKPHOS 92  BILITOT 0.4  PROT 7.1  ALBUMIN 3.0*   No results for input(s): LIPASE, AMYLASE in the last 168 hours. No results for input(s): AMMONIA in the last 168 hours. CBC:  Recent Labs Lab 04/21/15 1644  WBC 12.0*  HGB 12.4*  HCT 37.5*  MCV 90.6  PLT 347   CBG: No results for input(s): GLUCAP in the last 168 hours.  Recent Results (from the past 240 hour(s))  Culture, blood (routine x 2)     Status: None (Preliminary result)   Collection Time: 04/21/15  4:44 PM  Result Value Ref Range Status   Specimen Description BLOOD RIGHT ARM  Final   Special Requests BOTTLES DRAWN AEROBIC ONLY 10CC  Final   Culture PENDING  Incomplete   Report Status PENDING  Incomplete     Studies: Dg Chest Port 1 View  04/22/2015   ADDENDUM REPORT: 04/22/2015 14:50  ADDENDUM: CT scan of chest performed on  April 21, 2015 at another facility is now available for comparison. This demonstrates the right perihilar opacity seen on current study to most likely represent obstructive pneumonia with possible abscess formation.   Electronically Signed   By: Marijo Conception, M.D.   On: 04/22/2015 14:50   04/22/2015   CLINICAL DATA:  Respiratory failure, shortness of breath.  EXAM: PORTABLE CHEST - 1 VIEW  COMPARISON:  CT scan of March 07, 2015.  FINDINGS: There is the interval development of large right perihilar opacity concerning for inflammation or mass. No pneumothorax or  significant pleural effusion is noted. Minimal scarring or subsegmental atelectasis is noted in the left lung base. Bony thorax is intact.  IMPRESSION: New large right perihilar opacity is noted concerning for focal inflammation or possible mass. CT scan of the chest is recommended for further evaluation.  Electronically Signed: By: Marijo Conception, M.D. On: 04/22/2015 07:59    Scheduled Meds: . ceFEPime (MAXIPIME) IV  2 g Intravenous Q8H  . dorzolamide  1 drop Both Eyes BID  . heparin subcutaneous  5,000 Units Subcutaneous 3 times per day  . lactose free nutrition  237 mL Oral Q1500  . latanoprost  1 drop Both Eyes QHS  . saccharomyces boulardii  250 mg Oral BID  . vancomycin  1,000 mg Intravenous Q12H   Continuous Infusions:   Principal Problem:   Obstructive pneumonia Active Problems:   GERD   Dysphagia, pharyngoesophageal phase   PVC's (premature ventricular contractions)   Lung abscess   History of Clostridium difficile infection   Lung mass   Pulmonary abscess   HCAP (healthcare-associated pneumonia)   Malnutrition of moderate degree    Time spent: 30 minutes    Barton Dubois  [facility] Pager [phone]. If 7PM-7AM, please contact night-coverage at [email or web address], password [facility] 04/22/2015, 3:18 PM  LOS: 1 day

## 2015-04-22 NOTE — Progress Notes (Signed)
INITIAL NUTRITION ASSESSMENT  Pt meets criteria for NON-SEVERE (MODERATE) MALNUTRITION in the context of chronic illness as evidenced by moderate fat mass loss and moderate to severe muscle mass loss.  DOCUMENTATION CODES Per approved criteria  -Non-severe (moderate) malnutrition in the context of chronic illness   INTERVENTION: Provide Boost Plus po once daily, each supplement provides 360 kcal and 14 grams of protein.  NUTRITION DIAGNOSIS: Increased nutrient needs related to acute illness as evidenced by estimated nutrition needs..   Goal: Pt to meet >/= 90% of their estimated nutrition needs   Monitor:  PO intake, weight trends, labs, I/O's  Reason for Assessment: MST  81 y.o. male  Admitting Dx: Obstructive pneumonia  ASSESSMENT: Pt with ongoing fevers, cough, abnormal CT scan. CT scan revealed obstructive PNA with possible abscess formation.  Pt reports having a good appetite currently and PTA at home eating 3 meals a day with an occasional Boost Shake. Current meal completion is 100%. Weight has been stable with usual body weight at 160 lbs. Pt currently has Ensure ordered, however pt as been refusing them as he prefers Boost. RD to modify orders. Pt encouraged to eat his food at meals.   Nutrition Focused Physical Exam:  Subcutaneous Fat:  Orbital Region: N/A Upper Arm Region: WNL Thoracic and Lumbar Region: Moderate depletion  Muscle:  Temple Region: Moderate depletion Clavicle Bone Region: Severe depletion Clavicle and Acromion Bone Region: Severe depletion Scapular Bone Region: N/A Dorsal Hand: Moderate depletion Patellar Region: WNL Anterior Thigh Region: Moderate depletion Posterior Calf Region: WNL  Edema: none  Labs and medications reviewed.  Height: Ht Readings from Last 1 Encounters:  10/28/14 6\' 1"  (1.854 m)    Weight: Wt Readings from Last 1 Encounters:  04/21/15 158 lb 12.8 oz (72.031 kg)    Ideal Body Weight: 184 lbs  % Ideal Body  Weight: 86%  Wt Readings from Last 10 Encounters:  04/21/15 158 lb 12.8 oz (72.031 kg)  10/28/14 163 lb 6.4 oz (74.118 kg)  04/23/14 165 lb (74.844 kg)  10/15/13 163 lb (73.936 kg)  06/11/13 158 lb 12.8 oz (72.031 kg)  02/18/13 166 lb 6.4 oz (75.479 kg)  11/07/12 163 lb (73.936 kg)  10/27/12 163 lb (73.936 kg)  07/23/12 161 lb 12.8 oz (73.392 kg)  12/26/11 165 lb 12.8 oz (75.206 kg)    Usual Body Weight: 160 lbs  % Usual Body Weight: 99%  BMI:  Body mass index is 20.96 kg/(m^2).  Estimated Nutritional Needs: Kcal: 1850-2100 Protein: 90-100 grams Fluid: 1.85 - 2.1 L/day  Skin: Intact  Diet Order: Diet regular Room service appropriate?: Yes; Fluid consistency:: Thin  EDUCATION NEEDS: -No education needs identified at this time   Intake/Output Summary (Last 24 hours) at 04/22/15 0932 Last data filed at 04/22/15 0900  Gross per 24 hour  Intake    850 ml  Output    304 ml  Net    546 ml    Last BM: PTA  Labs:   Recent Labs Lab 04/21/15 1644  NA 134*  K 4.4  CL 98  CO2 26  BUN 16  CREATININE 0.82  CALCIUM 8.8  GLUCOSE 118*    CBG (last 3)  No results for input(s): GLUCAP in the last 72 hours.  Scheduled Meds: . ceFEPime (MAXIPIME) IV  2 g Intravenous Q8H  . dorzolamide  1 drop Both Eyes BID  . feeding supplement (ENSURE ENLIVE)  237 mL Oral BID BM  . heparin subcutaneous  5,000 Units Subcutaneous  3 times per day  . latanoprost  1 drop Both Eyes QHS  . saccharomyces boulardii  250 mg Oral BID  . vancomycin  1,000 mg Intravenous Q12H    Continuous Infusions:   Past Medical History  Diagnosis Date  . Hypertension   . PVC's (premature ventricular contractions)   . Rectal urgency   . Chest pressure   . Heart palpitations   . Aortic sclerosis   . ED (erectile dysfunction)   . Fatigue   . GERD (gastroesophageal reflux disease)   . Cancer of parotid gland 1988  . Glaucoma, bilateral     "severe right; moderate left" (04/21/2015)  . Pneumonia  1930's; 04/21/2015    Past Surgical History  Procedure Laterality Date  . Transthoracic echocardiogram  01/20/2010    NORMAL. EF 55-60%  . Cardiovascular stress test      NO ISCHEMIA  . Parotid gland tumor excision  1988    radiation therapy  . Tonsillectomy  1943  . Inguinal hernia repair Right 1999  . Glaucoma surgery Bilateral "several"    "laser"    Kallie Locks, MS, RD, LDN Pager # [phone] After hours/ weekend pager # [phone]

## 2015-04-22 NOTE — Progress Notes (Signed)
I faxed the CT report to Radiology and to the floor. I still believe that his Head and Neck issues and chronic Dysphagia has lead to a possible chronic Aspiration.  I still think this is related to Asp PNA.  Hopefully can be treated c IV Abx and not Chest tubes or VATS

## 2015-04-23 ENCOUNTER — Inpatient Hospital Stay (HOSPITAL_COMMUNITY): Payer: Medicare Other

## 2015-04-23 DIAGNOSIS — Z8619 Personal history of other infectious and parasitic diseases: Secondary | ICD-10-CM

## 2015-04-23 NOTE — Progress Notes (Signed)
[facility] PROGRESS NOTE  Ryan Lynn GUY:[id number] DOB: Jun 19, 1934 DOA: 04/21/2015 PCP: [name], MD  Assessment/Plan: Postobstructive pneumonia with possible right upper lobe abscess -Seen on outpatient CT scan. -will continue current antibiotic therapy -will repeat CT chest on 4/24. -Per PCCM rec's will follow image study and depending results decide on need for bronchoscopy. 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 -Patient has been working with this for the past 20 years with ENT (Dr. Lucia Gaskins). -He will continue follow up ENT as an outpatient. -Patient with good techniques and understanding of his ability to swallow certain foods depending food consistency.  Moderate protein calorie malnutrition -Dietitian has been consulted -Will follow rec's for feeding supplements   PVCs with a history of chronic palpitations. -Sinus rhythm on telemetry over 24 hours -Rate controlled -Denies chest pain  GERD -Given history of C. difficile will not order PPI. -Will continue the use of tums and PRN simethicone -Patient denies nausea or worsening reflux symptoms  Hypertension -Currently stable -Patient managing with diet control -Will monitor  Code Status: DNR Family Communication: wife at bedside Disposition Plan: remains inpatient   Consultants:  PCCM (Dr. Nelda Marseille)  Procedures:  See below for x-ray reports   Antibiotics:  Vancomycin 4/21  Cefepime 4/21  HPI/Subjective: Patient with low-grade fever, no chest pain and good air movement on physical exam.  Objective: Filed Vitals:   04/23/15 1006  BP: 111/60  Pulse: 109  Temp: 99.1 F (37.3 C)  Resp: 18    Intake/Output Summary (Last 24 hours) at 04/23/15 1750 Last data filed at 04/23/15 1007  Gross per 24 hour  Intake   1780 ml  Output      0 ml  Net   1780 ml   Filed Weights   04/21/15  2027 04/22/15 2055  Weight: 72.031 kg (158 lb 12.8 oz) 71.079 kg (156 lb 11.2 oz)    Exam:   General:  Patient with low-grade fever; denies CP and reports no significant SOB. Chronically hoarse   Cardiovascular:S1 and S2, no rubs or gallops  Respiratory: no wheezing; but with positive rhonchi and with good air movement   Abdomen: soft, NT, ND, positive BS  Musculoskeletal: no edema, no cyanosis or clubbing  Data Reviewed: Basic Metabolic Panel:  Recent Labs Lab 04/21/15 1644  NA 134*  K 4.4  CL 98  CO2 26  GLUCOSE 118*  BUN 16  CREATININE 0.82  CALCIUM 8.8   Liver Function Tests:  Recent Labs Lab 04/21/15 1644  AST 28  ALT 24  ALKPHOS 92  BILITOT 0.4  PROT 7.1  ALBUMIN 3.0*   CBC:  Recent Labs Lab 04/21/15 1644  WBC 12.0*  HGB 12.4*  HCT 37.5*  MCV 90.6  PLT 347   CBG: No results for input(s): GLUCAP in the last 168 hours.  Recent Results (from the past 240 hour(s))  Culture, blood (routine x 2)     Status: None (Preliminary result)   Collection Time: 04/21/15  4:15 PM  Result Value Ref Range Status   Specimen Description BLOOD LEFT ARM  Final   Special Requests BOTTLES DRAWN AEROBIC ONLY [place]  Final   Culture   Final           BLOOD CULTURE RECEIVED NO GROWTH TO DATE CULTURE WILL BE HELD FOR 5 DAYS BEFORE ISSUING A FINAL NEGATIVE REPORT Performed at [facility]    Report Status PENDING  Incomplete  Culture, blood (routine x 2)     Status: None (Preliminary result)   Collection Time: 04/21/15  4:44 PM  Result Value Ref Range Status   Specimen Description BLOOD RIGHT ARM  Final   Special Requests BOTTLES DRAWN AEROBIC ONLY 10CC  Final   Culture   Final           BLOOD CULTURE RECEIVED NO GROWTH TO DATE CULTURE WILL BE HELD FOR 5 DAYS BEFORE ISSUING A FINAL NEGATIVE REPORT Performed at [facility]    Report Status PENDING  Incomplete     Studies: Dg Chest Port 1 View  04/22/2015   ADDENDUM REPORT: 04/22/2015 14:50   ADDENDUM: CT scan of chest performed on April 21, 2015 at another facility is now available for comparison. This demonstrates the right perihilar opacity seen on current study to most likely represent obstructive pneumonia with possible abscess formation.   Electronically Signed   By: Marijo Conception, M.D.   On: 04/22/2015 14:50   04/22/2015   CLINICAL DATA:  Respiratory failure, shortness of breath.  EXAM: PORTABLE CHEST - 1 VIEW  COMPARISON:  CT scan of March 07, 2015.  FINDINGS: There is the interval development of large right perihilar opacity concerning for inflammation or mass. No pneumothorax or significant pleural effusion is noted. Minimal scarring or subsegmental atelectasis is noted in the left lung base. Bony thorax is intact.  IMPRESSION: New large right perihilar opacity is noted concerning for focal inflammation or possible mass. CT scan of the chest is recommended for further evaluation.  Electronically Signed: By: Marijo Conception, M.D. On: 04/22/2015 07:59    Scheduled Meds: . ceFEPime (MAXIPIME) IV  2 g Intravenous Q8H  . dorzolamide  1 drop Both Eyes BID  . guaiFENesin  600 mg Oral BID  . heparin subcutaneous  5,000 Units Subcutaneous 3 times per day  . lactose free nutrition  237 mL Oral Q1500  . latanoprost  1 drop Both Eyes QHS  . saccharomyces boulardii  250 mg Oral BID  . vancomycin  1,000 mg Intravenous Q12H   Continuous Infusions:   Principal Problem:   Obstructive pneumonia Active Problems:   GERD   Dysphagia, pharyngoesophageal phase   PVC's (premature ventricular contractions)   Lung abscess   History of Clostridium difficile infection   Lung mass   Pulmonary abscess   HCAP (healthcare-associated pneumonia)   Malnutrition of moderate degree    Time spent: 30 minutes    Barton Dubois  [facility] Pager [phone]. If 7PM-7AM, please contact night-coverage at [email or web address], password [facility] 04/23/2015, 5:50 PM  LOS: 2 days

## 2015-04-24 LAB — BASIC METABOLIC PANEL
Anion gap: 8 (ref 5–15)
BUN: 10 mg/dL (ref 6–23)
CO2: 27 mmol/L (ref 19–32)
CREATININE: 0.86 mg/dL (ref 0.50–1.35)
Calcium: 8.8 mg/dL (ref 8.4–10.5)
Chloride: 98 mmol/L (ref 96–112)
GFR calc Af Amer: >90 mL/min (ref >90)
GFR calc non Af Amer: 80 mL/min — ABNORMAL LOW (ref >90)
GLUCOSE: 166 mg/dL — AB (ref 70–99)
Potassium: 4.6 mmol/L (ref 3.5–5.1)
Sodium: 133 mmol/L — ABNORMAL LOW (ref 135–145)

## 2015-04-24 NOTE — Progress Notes (Signed)
[facility] PROGRESS NOTE  Ryan Lynn ZOX:[id number] DOB: 12-28-1934 DOA: 04/21/2015 PCP: [name], MD  Assessment/Plan: Postobstructive pneumonia with possible right upper lobe abscess -will continue current antibiotic therapy -Repeated CT chest (Continued regression of the posterior segment masslike opacity in the RIGHT upper lobe. New dense consolidation in the anterior medial aspect of the RIGHT upper lobe. New small pleural effusion) -Will follow PCCM rec's on need for bronchoscopy. 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 -Patient has been working with this for the past 20 years with ENT (Dr. Lucia Gaskins). -He will continue follow up ENT as an outpatient. -Patient with good techniques and understanding of his ability to swallow certain foods depending food consistency.  Moderate protein calorie malnutrition -Dietitian has been consulted -Will follow rec's for feeding supplements   PVCs with a history of chronic palpitations. -Sinus rhythm on telemetry over 24 hours -Rate controlled -Denies chest pain or palpitations  GERD -Given history of C. difficile will not order PPI. -Will continue the use of tums and PRN simethicone -Patient denies nausea or worsening reflux symptoms  Hypertension -Currently stable -Patient managing with diet control -Will monitor VS  Code Status: DNR Family Communication: wife at bedside Disposition Plan: remains inpatient   Consultants:  PCCM (Dr. Nelda Marseille)  Procedures:  See below for x-ray reports   Antibiotics:  Vancomycin 4/21  Cefepime 4/21  HPI/Subjective: Currently afebrile, no chest pain, no significant shortness of breath. Patient without productive cough.  Objective: Filed Vitals:   04/24/15 0441  BP: 136/83  Pulse: 92  Temp: 98.9 F (37.2 C)  Resp: 18    Intake/Output Summary (Last 24 hours) at  04/24/15 1449 Last data filed at 04/24/15 0949  Gross per 24 hour  Intake   2490 ml  Output   1600 ml  Net    890 ml   Filed Weights   04/21/15 2027 04/22/15 2055 04/23/15 2135  Weight: 72.031 kg (158 lb 12.8 oz) 71.079 kg (156 lb 11.2 oz) 72.3 kg (159 lb 6.3 oz)    Exam:   General:  Patient without fever; but endorses mild episodes of night sweats overnight; denies CP and reports no significant SOB. Chronically hoarse from previous radiation therapy and surgery due to parotid malignancy  Cardiovascular:S1 and S2, no rubs or gallops  Respiratory: no wheezing; but with positive rhonchi and with good air movement   Abdomen: soft, NT, ND, positive BS  Musculoskeletal: no edema, no cyanosis or clubbing  Data Reviewed: Basic Metabolic Panel:  Recent Labs Lab 04/21/15 1644 04/24/15 1245  NA 134* 133*  K 4.4 4.6  CL 98 98  CO2 26 27  GLUCOSE 118* 166*  BUN 16 10  CREATININE 0.82 0.86  CALCIUM 8.8 8.8   Liver Function Tests:  Recent Labs Lab 04/21/15 1644  AST 28  ALT 24  ALKPHOS 92  BILITOT 0.4  PROT 7.1  ALBUMIN 3.0*   CBC:  Recent Labs Lab 04/21/15 1644  WBC 12.0*  HGB 12.4*  HCT 37.5*  MCV 90.6  PLT 347   CBG: No results for input(s): GLUCAP in the last 168 hours.  Recent Results (from the past 240 hour(s))  Culture, blood (routine x 2)     Status: None (Preliminary result)   Collection Time: 04/21/15  4:15 PM  Result Value Ref Range Status   Specimen Description BLOOD LEFT ARM  Final   Special Requests BOTTLES DRAWN AEROBIC ONLY  [facility]  Final   Culture   Final           BLOOD CULTURE RECEIVED NO GROWTH TO DATE CULTURE WILL BE HELD FOR 5 DAYS BEFORE ISSUING A FINAL NEGATIVE REPORT Performed at [facility]    Report Status PENDING  Incomplete  Culture, blood (routine x 2)     Status: None (Preliminary result)   Collection Time: 04/21/15  4:44 PM  Result Value Ref Range Status   Specimen Description BLOOD RIGHT ARM  Final   Special  Requests BOTTLES DRAWN AEROBIC ONLY 10CC  Final   Culture   Final           BLOOD CULTURE RECEIVED NO GROWTH TO DATE CULTURE WILL BE HELD FOR 5 DAYS BEFORE ISSUING A FINAL NEGATIVE REPORT Performed at [facility]    Report Status PENDING  Incomplete     Studies: Ct Chest Wo Contrast  04/23/2015   CLINICAL DATA:  Evaluate for pneumonia. Possible endobronchial lesion.  EXAM: CT CHEST WITHOUT CONTRAST  TECHNIQUE: Multidetector CT imaging of the chest was performed following the standard protocol without IV contrast.  COMPARISON:  CT chest without contrast 02/09/2015. CT chest without contrast 03/07/2015.  FINDINGS: Continued regression of posterior segment RIGHT upper lobe hypermetabolic lesion. New dense consolidation in the anterior and medial aspect of the RIGHT upper lobe. Proximal RIGHT upper lobe bronchus appears patent. New slight LEFT lingular infiltrate not nearly is dense as the RIGHT upper lobe infiltrate. New slight posterior segment LEFT upper lobe infiltrate. New RIGHT pleural effusion.  Ascending aortic caliber up to 4.2 cm in diameter, stable.  Lymph nodes: Slight decrease pretracheal node image 29 short axis diameter of 9 mm. RIGHT hilar node incompletely evaluated. These are difficult to measure due to the lack of IV contrast.  RIGHT lower lobe nodular densities described on prior CTA appears stable. Stable volume loss and cylindrical bronchiectasis in the lingula.  Unchanged upper abdominal findings of gallstones, segment 2 cyst in the liver. Stable AC joint arthropathy and thoracic scoliosis.  IMPRESSION: Continued regression of the posterior segment masslike opacity in the RIGHT upper lobe.  New dense consolidation in the anterior medial aspect of the RIGHT upper lobe. Correlate clinically for a waxing and waning infectious process. Other areas of new but less consolidative infiltrate in the LEFT lung as described. New RIGHT pleural effusion.   Electronically Signed   By: Rolla Flatten M.D.   On: 04/23/2015 21:57    Scheduled Meds: . ceFEPime (MAXIPIME) IV  2 g Intravenous Q8H  . dorzolamide  1 drop Both Eyes BID  . guaiFENesin  600 mg Oral BID  . heparin subcutaneous  5,000 Units Subcutaneous 3 times per day  . lactose free nutrition  237 mL Oral Q1500  . latanoprost  1 drop Both Eyes QHS  . saccharomyces boulardii  250 mg Oral BID  . vancomycin  1,000 mg Intravenous Q12H   Continuous Infusions:   Principal Problem:   Obstructive pneumonia Active Problems:   GERD   Dysphagia, pharyngoesophageal phase   PVC's (premature ventricular contractions)   Lung abscess   History of Clostridium difficile infection   Lung mass   Pulmonary abscess   HCAP (healthcare-associated pneumonia)   Malnutrition of moderate degree    Time spent: 30 minutes    Barton Dubois  [facility] Pager [phone]. If 7PM-7AM, please contact night-coverage at [email or web address], password [facility] 04/24/2015, 2:49 PM  LOS: 3 days

## 2015-04-24 NOTE — Progress Notes (Signed)
[place] for Cefepime/Vanco Indication: PNA with possible abscess/empyema  Allergies  Allergen Reactions  . Cefdinir Diarrhea  . Clindamycin/Lincomycin Diarrhea and Other (See Comments)    c diff  . Doxazosin     Low BP and light-headedness  . Doxycycline Hyclate     Dizziness and upset stomach  . Esomeprazole Magnesium Diarrhea  . Simbrinza [Brinzolamide-Brimonidine]     Low BP and light-headedness, fatigue  . Timolol     Causes low  BP and light-headedness    Patient Measurements: Height: 6\' 2"  (188 cm) Weight: 159 lb 6.3 oz (72.3 kg) IBW/kg (Calculated) : 82.2 Adjusted Body Weight:    Vital Signs: Temp: 98.9 F (37.2 C) (04/24 0441) Temp Source: Oral (04/24 0441) BP: 136/83 mmHg (04/24 0441) Pulse Rate: 92 (04/24 0441) Intake/Output from previous day: 04/23 0701 - 04/24 0700 In: 2950 [P.O.:1900; IV Piggyback:1050] Out: 1600 [Urine:1600] Intake/Output from this shift: Total I/O In: 240 [P.O.:240] Out: -   Labs:  Recent Labs  04/21/15 1644  WBC 12.0*  HGB 12.4*  PLT 347  CREATININE 0.82   Estimated Creatinine Clearance: 74.7 mL/min (by C-G formula based on Cr of 0.82). No results for input(s): VANCOTROUGH, VANCOPEAK, VANCORANDOM, GENTTROUGH, GENTPEAK, GENTRANDOM, TOBRATROUGH, TOBRAPEAK, TOBRARND, AMIKACINPEAK, AMIKACINTROU, AMIKACIN in the last 72 hours.   Microbiology: Recent Results (from the past 720 hour(s))  Culture, blood (routine x 2)     Status: None (Preliminary result)   Collection Time: 04/21/15  4:15 PM  Result Value Ref Range Status   Specimen Description BLOOD LEFT ARM  Final   Special Requests BOTTLES DRAWN AEROBIC ONLY [place]  Final   Culture   Final           BLOOD CULTURE RECEIVED NO GROWTH TO DATE CULTURE WILL BE HELD FOR 5 DAYS BEFORE ISSUING A FINAL NEGATIVE REPORT Performed at [facility]    Report Status PENDING  Incomplete  Culture, blood (routine x 2)     Status: None (Preliminary  result)   Collection Time: 04/21/15  4:44 PM  Result Value Ref Range Status   Specimen Description BLOOD RIGHT ARM  Final   Special Requests BOTTLES DRAWN AEROBIC ONLY 10CC  Final   Culture   Final           BLOOD CULTURE RECEIVED NO GROWTH TO DATE CULTURE WILL BE HELD FOR 5 DAYS BEFORE ISSUING A FINAL NEGATIVE REPORT Performed at [facility]    Report Status PENDING  Incomplete    Medical History: Past Medical History  Diagnosis Date  . Hypertension   . PVC's (premature ventricular contractions)   . Rectal urgency   . Chest pressure   . Heart palpitations   . Aortic sclerosis   . ED (erectile dysfunction)   . Fatigue   . GERD (gastroesophageal reflux disease)   . Cancer of parotid gland 1988  . Glaucoma, bilateral     "severe right; moderate left" (04/21/2015)  . Pneumonia 1930's; 04/21/2015    Medications:  Prescriptions prior to admission  Medication Sig Dispense Refill Last Dose  . Calcium Carbonate Antacid (TUMS ULTRA 1000 PO) Take 1,000 mg by mouth as needed (for indigestion).    Past Month at Unknown time  . Cholecalciferol (VITAMIN D PO) Take 1 tablet by mouth 2 (two) times daily.    04/21/2015 at Unknown time  . dorzolamide (TRUSOPT) 2 % ophthalmic solution Place 1 drop into both eyes 2 (two) times daily.    04/21/2015  at Unknown time  . ibuprofen (ADVIL,MOTRIN) 200 MG tablet Take 200 mg by mouth every 6 (six) hours as needed for fever.   04/20/2015  . latanoprost (XALATAN) 0.005 % ophthalmic solution Place 1 drop into both eyes daily.    04/21/2015 at Unknown time  . Probiotic Product (ALIGN) 4 MG CAPS Take 4 mg by mouth daily.    04/21/2015 at Unknown time  . simethicone (MYLICON) 409 MG chewable tablet Chew 125 mg by mouth as needed.    Past Month at Unknown time  . vitamin C (ASCORBIC ACID) 500 MG tablet Take 1,000 mg by mouth 2 (two) times daily.    04/21/2015 at Unknown time   Assessment: 81 y/o M presented 4/15 with cough, congestion, fever and was placed  on Levaquin by primary MD. Now 4/21 pt continues to have intermittent fever and feels bad. Continuing on vanc/cefepime for RUL obstructive pna with possible abscess formation. MD to keep BS abx the same for today. Afeb, wbc wnl. SCr<1 wnl (labs from 4/21), CrCl~74, UOP good 0.9. To f/u SCr ordered for today.  Vanc 4/21>> Cefepime 4/21>>  4/21 BCx2 - ngtd Flu neg Strep pneumo antigen neg  Goal of Therapy:  Vancomycin trough level 15-20 mcg/ml  Plan:  Cefepime 2 g IV q8h Vanc 1 g IV q12h F/u cxs, renal fxn, VTss as indicated, abx de-escalation plan  Elicia Lamp, PharmD Clinical Pharmacist - Resident Pager [phone] 04/24/2015 11:51 AM

## 2015-04-25 DIAGNOSIS — J690 Pneumonitis due to inhalation of food and vomit: Secondary | ICD-10-CM

## 2015-04-25 LAB — BASIC METABOLIC PANEL
ANION GAP: 9 (ref 5–15)
BUN: 9 mg/dL (ref 6–23)
CO2: 28 mmol/L (ref 19–32)
CREATININE: 0.83 mg/dL (ref 0.50–1.35)
Calcium: 8.8 mg/dL (ref 8.4–10.5)
Chloride: 98 mmol/L (ref 96–112)
GFR calc Af Amer: >90 mL/min (ref >90)
GFR, EST NON AFRICAN AMERICAN: 82 mL/min — AB (ref >90)
GLUCOSE: 88 mg/dL (ref 70–99)
Potassium: 4.7 mmol/L (ref 3.5–5.1)
Sodium: 135 mmol/L (ref 135–145)

## 2015-04-25 LAB — CBC
HCT: 35.9 % — ABNORMAL LOW (ref 39.0–52.0)
HEMOGLOBIN: 11.7 g/dL — AB (ref 13.0–17.0)
MCH: 29.5 pg (ref 26.0–34.0)
MCHC: 32.6 g/dL (ref 30.0–36.0)
MCV: 90.7 fL (ref 78.0–100.0)
Platelets: 397 10*3/uL (ref 150–400)
RBC: 3.96 MIL/uL — ABNORMAL LOW (ref 4.22–5.81)
RDW: 13.7 % (ref 11.5–15.5)
WBC: 10 10*3/uL (ref 4.0–10.5)

## 2015-04-25 NOTE — Progress Notes (Signed)
[facility] PROGRESS NOTE  Ryan Lynn [id number]:[id number] DOB: 10-19-1934 DOA: 04/21/2015 PCP: [name], MD  Assessment/Plan: Postobstructive pneumonia with possible right upper lobe abscess -will continue current antibiotic therapy -Repeated CT chest (Continued regression of the posterior segment masslike opacity in the RIGHT upper lobe. New dense consolidation in the anterior medial aspect of the RIGHT upper lobe. New small pleural effusion) -following PCCM rec's will continue antibiotics and if stable will d/c home on antibiotics for 2 weeks therapy. No bronchoscopy at this time. 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 -Patient has been working with this for the past 20 years with ENT (Dr. Lucia Gaskins). -He will continue follow up ENT as an outpatient. -Patient with good techniques and understanding of his ability to swallow certain foods depending food consistency. -following PCCM will ask SPL to see and provide evaluation/treatment rec's to minimize aspiration  Moderate protein calorie malnutrition -Dietitian has been consulted -Will follow rec's for feeding supplements   PVCs with a history of chronic palpitations. -Sinus rhythm on telemetry over 24 hours -Rate controlled -Denies chest pain or palpitations  GERD -Given history of C. difficile will not order PPI. -Will continue the use of tums and PRN simethicone -Patient denies nausea or worsening reflux symptoms  Hypertension -Currently stable -Patient managing with diet control -Will monitor VS  Code Status: DNR Family Communication: wife at bedside Disposition Plan: remains inpatient   Consultants:  PCCM (Dr. Nelda Marseille)  Procedures:  See below for x-ray reports   Antibiotics:  Vancomycin 4/21  Cefepime 4/21  HPI/Subjective: Remains afebrile. No further night  sweats and without CP or significant SOB.  Objective: Filed Vitals:   04/25/15 0904  BP: 110/73  Pulse: 103  Temp: 97.8 F (36.6 C)  Resp: 17    Intake/Output Summary (Last 24 hours) at 04/25/15 1605 Last data filed at 04/25/15 0904  Gross per 24 hour  Intake   1270 ml  Output   1600 ml  Net   -330 ml   Filed Weights   04/21/15 2027 04/22/15 2055 04/23/15 2135  Weight: 72.031 kg (158 lb 12.8 oz) 71.079 kg (156 lb 11.2 oz) 72.3 kg (159 lb 6.3 oz)    Exam:   General:  Patient without fever; and without any further night sweats episodes. Denies CP and reports no significant SOB.   Cardiovascular:S1 and S2, no rubs or gallops  Respiratory: no wheezing; but with positive mild rhonchi and with good air movement   Abdomen: soft, NT, ND, positive BS  Musculoskeletal: no edema, no cyanosis or clubbing  Data Reviewed: Basic Metabolic Panel:  Recent Labs Lab 04/21/15 1644 04/24/15 1245 04/25/15 0642  NA 134* 133* 135  K 4.4 4.6 4.7  CL 98 98 98  CO2 26 27 28   GLUCOSE 118* 166* 88  BUN 16 10 9   CREATININE 0.82 0.86 0.83  CALCIUM 8.8 8.8 8.8   Liver Function Tests:  Recent Labs Lab 04/21/15 1644  AST 28  ALT 24  ALKPHOS 92  BILITOT 0.4  PROT 7.1  ALBUMIN 3.0*   CBC:  Recent Labs Lab 04/21/15 1644 04/25/15 0642  WBC 12.0* 10.0  HGB 12.4* 11.7*  HCT 37.5* 35.9*  MCV 90.6 90.7  PLT 347 397   CBG: No results for input(s): GLUCAP in the last 168 hours.  Recent Results (from the past 240 hour(s))  Culture, blood (routine x 2)  Status: None (Preliminary result)   Collection Time: 04/21/15  4:15 PM  Result Value Ref Range Status   Specimen Description BLOOD LEFT ARM  Final   Special Requests BOTTLES DRAWN AEROBIC ONLY [place]  Final   Culture   Final           BLOOD CULTURE RECEIVED NO GROWTH TO DATE CULTURE WILL BE HELD FOR 5 DAYS BEFORE ISSUING A FINAL NEGATIVE REPORT Performed at [facility]    Report Status PENDING  Incomplete   Culture, blood (routine x 2)     Status: None (Preliminary result)   Collection Time: 04/21/15  4:44 PM  Result Value Ref Range Status   Specimen Description BLOOD RIGHT ARM  Final   Special Requests BOTTLES DRAWN AEROBIC ONLY 10CC  Final   Culture   Final           BLOOD CULTURE RECEIVED NO GROWTH TO DATE CULTURE WILL BE HELD FOR 5 DAYS BEFORE ISSUING A FINAL NEGATIVE REPORT Performed at [facility]    Report Status PENDING  Incomplete     Studies: Ct Chest Wo Contrast  04/23/2015   CLINICAL DATA:  Evaluate for pneumonia. Possible endobronchial lesion.  EXAM: CT CHEST WITHOUT CONTRAST  TECHNIQUE: Multidetector CT imaging of the chest was performed following the standard protocol without IV contrast.  COMPARISON:  CT chest without contrast 02/09/2015. CT chest without contrast 03/07/2015.  FINDINGS: Continued regression of posterior segment RIGHT upper lobe hypermetabolic lesion. New dense consolidation in the anterior and medial aspect of the RIGHT upper lobe. Proximal RIGHT upper lobe bronchus appears patent. New slight LEFT lingular infiltrate not nearly is dense as the RIGHT upper lobe infiltrate. New slight posterior segment LEFT upper lobe infiltrate. New RIGHT pleural effusion.  Ascending aortic caliber up to 4.2 cm in diameter, stable.  Lymph nodes: Slight decrease pretracheal node image 29 short axis diameter of 9 mm. RIGHT hilar node incompletely evaluated. These are difficult to measure due to the lack of IV contrast.  RIGHT lower lobe nodular densities described on prior CTA appears stable. Stable volume loss and cylindrical bronchiectasis in the lingula.  Unchanged upper abdominal findings of gallstones, segment 2 cyst in the liver. Stable AC joint arthropathy and thoracic scoliosis.  IMPRESSION: Continued regression of the posterior segment masslike opacity in the RIGHT upper lobe.  New dense consolidation in the anterior medial aspect of the RIGHT upper lobe. Correlate  clinically for a waxing and waning infectious process. Other areas of new but less consolidative infiltrate in the LEFT lung as described. New RIGHT pleural effusion.   Electronically Signed   By: Rolla Flatten M.D.   On: 04/23/2015 21:57    Scheduled Meds: . ceFEPime (MAXIPIME) IV  2 g Intravenous Q8H  . dorzolamide  1 drop Both Eyes BID  . guaiFENesin  600 mg Oral BID  . heparin subcutaneous  5,000 Units Subcutaneous 3 times per day  . lactose free nutrition  237 mL Oral Q1500  . latanoprost  1 drop Both Eyes QHS  . saccharomyces boulardii  250 mg Oral BID  . vancomycin  1,000 mg Intravenous Q12H   Continuous Infusions:   Principal Problem:   Obstructive pneumonia Active Problems:   GERD   Dysphagia, pharyngoesophageal phase   PVC's (premature ventricular contractions)   Lung abscess   History of Clostridium difficile infection   Lung mass   Pulmonary abscess   HCAP (healthcare-associated pneumonia)   Malnutrition of moderate degree   Aspiration  pneumonia    Time spent: 72 minutes    Barton Dubois  [facility] Pager [phone]. If 7PM-7AM, please contact night-coverage at [email or web address], password [facility] 04/25/2015, 4:05 PM  LOS: 4 days

## 2015-04-25 NOTE — Consult Note (Addendum)
Name: Ryan Lynn MRN: [id number] DOB: [birth year]    ADMISSION DATE:  04/21/2015 CONSULTATION DATE:  04/25/2015  REFERRING MD :  Karleen Hampshire  CHIEF COMPLAINT:  Cough, Fever  BRIEF PATIENT DESCRIPTION:    :  Ryan Lynn is a 81 y.o. M with PMH as outlined below.  He was sent to [facility] on 4/21 as a direct admit from his PCP's office (Dr. Virgina Jock) due to recurrent fevers, SOB, and abnormal outpatient CT scan that showed RUL obstructive PNA with possible abscess formation.  PCCM was consulted for further recs.  He states that starting in middle of March, he began to have cough and fevers for which he was placed on 7 day course of Levaquin.  Symptoms improved for 2 weeks before returning.  He was placed back on Levaquin; however, since then, has had persisted cough that for the most part has been completely dry (has had very minimal sputum production), low grade fevers (though once got to 101).  He denies any chills, wheezing, N/V/D, abd pain, myalgias, fatigue.  He has had occasional pleuritic chest pain associated with coughing and deep breathing only, located on right upper chest.  Has also had 1 or 2 nights where he has experienced night sweats.  He also has hx of pulmonary nodules that have been followed by serial CT scans.  CT from December 2015 showed 59mm nodule in RUL.  PET was obtained in Feb 2016 and showed dramatic increase in the size of confluent mass like density in the RUL, hypermetabolic nodules and lymph nodes in the mediastinum.  Follow up CT scan in March 2016 showed marked regression in size of RUL nodule which supported inflammatory origin; therefore, biopsy was deferred. SIGNIFICANT EVENTS  4/21 - admit  CT Chest 4/21 >>> RUL obstructive PNA with possible abscess formation (reported so, official read unable to be loaded on hospital computers).   SUBJECTIVE/OVERNIGHT/INTERVAL HX 04/25/2015: afebrile since admit. Detailed hx re-take - admits to > 20 years of severe dysphagia due to XRT in neck.  Ver compliant with chin tuck but recently signfiicant coughing eesp with ice cream. Describes bad bout few weeks ago. Says things could have gone into lung. Admits to weak cough and hoarse voice. No associated collangen vasc dz .    VITAL SIGNS: Temp:  [97.8 F (36.6 C)-98.4 F (36.9 C)] 97.8 F (36.6 C) (04/25 0904) Pulse Rate:  [92-103] 103 (04/25 0904) Resp:  [16-18] 17 (04/25 0904) BP: (110-152)/(57-87) 110/73 mmHg (04/25 0904) SpO2:  [98 %-100 %] 98 % (04/25 0904)  PHYSICAL EXAMINATION: General: Elderly male, walking around in room, in NAD. Neuro: A&O x 3, non-focal.  HEENT: [place]/AT. PERRL, sclerae anicteric. WEAK COUGH. HOARSE VOICE Cardiovascular: RRR, no M/R/G.  Lungs: Respirations even and unlabored.  Faint rhonchi RUL otherwise CTA. Abdomen: BS x 4, soft, NT/ND.  Musculoskeletal: No gross deformities, no edema.  Skin: Intact, warm, no rashes.    PULMONARY No results for input(s): PHART, PCO2ART, PO2ART, HCO3, TCO2, O2SAT in the last 168 hours.  Invalid input(s): PCO2, PO2  CBC  Recent Labs Lab 04/21/15 1644 04/25/15 0642  HGB 12.4* 11.7*  HCT 37.5* 35.9*  WBC 12.0* 10.0  PLT 347 397    COAGULATION No results for input(s): INR in the last 168 hours.  CARDIAC  No results for input(s): TROPONINI in the last 168 hours. No results for input(s): PROBNP in the last 168 hours.   CHEMISTRY  Recent Labs Lab 04/21/15 1644 04/24/15 1245 04/25/15 0642  NA 134* 133*  135  K 4.4 4.6 4.7  CL 98 98 98  CO2 26 27 28   GLUCOSE 118* 166* 88  BUN 16 10 9   CREATININE 0.82 0.86 0.83  CALCIUM 8.8 8.8 8.8   Estimated Creatinine Clearance: 73.8 mL/min (by C-G formula based on Cr of 0.83).   LIVER  Recent Labs Lab 04/21/15 1644  AST 28  ALT 24  ALKPHOS 92  BILITOT 0.4  PROT 7.1  ALBUMIN 3.0*     INFECTIOUS No results for input(s): LATICACIDVEN, PROCALCITON in the last 168 hours.   ENDOCRINE CBG (last 3)  No results for input(s): GLUCAP in the last  72 hours.       IMAGING x48h Ct Chest Wo Contrast  04/23/2015   CLINICAL DATA:  Evaluate for pneumonia. Possible endobronchial lesion.  EXAM: CT CHEST WITHOUT CONTRAST  TECHNIQUE: Multidetector CT imaging of the chest was performed following the standard protocol without IV contrast.  COMPARISON:  CT chest without contrast 02/09/2015. CT chest without contrast 03/07/2015.  FINDINGS: Continued regression of posterior segment RIGHT upper lobe hypermetabolic lesion. New dense consolidation in the anterior and medial aspect of the RIGHT upper lobe. Proximal RIGHT upper lobe bronchus appears patent. New slight LEFT lingular infiltrate not nearly is dense as the RIGHT upper lobe infiltrate. New slight posterior segment LEFT upper lobe infiltrate. New RIGHT pleural effusion.  Ascending aortic caliber up to 4.2 cm in diameter, stable.  Lymph nodes: Slight decrease pretracheal node image 29 short axis diameter of 9 mm. RIGHT hilar node incompletely evaluated. These are difficult to measure due to the lack of IV contrast.  RIGHT lower lobe nodular densities described on prior CTA appears stable. Stable volume loss and cylindrical bronchiectasis in the lingula.  Unchanged upper abdominal findings of gallstones, segment 2 cyst in the liver. Stable AC joint arthropathy and thoracic scoliosis.  IMPRESSION: Continued regression of the posterior segment masslike opacity in the RIGHT upper lobe.  New dense consolidation in the anterior medial aspect of the RIGHT upper lobe. Correlate clinically for a waxing and waning infectious process. Other areas of new but less consolidative infiltrate in the LEFT lung as described. New RIGHT pleural effusion.   Electronically Signed   By: Rolla Flatten M.D.   On: 04/23/2015 21:57       ASSESSMENT / PLAN: #RUL -post segment nodule - enlarged Dec 2015-> Feb 2016 and regressed March 2016 (bx canceled) and further regressed April 23016  #RML consolidation / - RML always atbaseline  had for years ? Some mild bronchiectasis v scar tissue. Now sudden RML medial segment consolidation new [employer] 2016 (had fevers several times in late march ./mid April 2016). Associated bad dysphagia present #Dysphagia    DDx - aspiration pna, autoimmune proceess, BOOP, CAP v HCAP. Of these apiration most likely. Doubt endobronchial lesion  Recs: Speehc therapy swallo eval (describes ENT eval 18 months ago) Hold off bronch Check autoimmune Continue empiric abx l total 1-2 weeks Follow cultures. Albuterol PRN. No role steroids. Pulmonary hygiene. Will fu CT in 6 weeks - if persists then consider     > 50% of this > 40 min visit spent in face to face counseling or/and coordination of care  -wife ahdn he counseled   Dr. Brand Males, M.D., [facility].C.P Pulmonary and Critical Care Medicine Staff Physician [place] [facility] Pager: [phone], If no answer or between  15:00h - 7:00h: call [phone]  04/25/2015 11:06 AM

## 2015-04-26 ENCOUNTER — Inpatient Hospital Stay (HOSPITAL_COMMUNITY): Payer: Medicare Other

## 2015-04-26 DIAGNOSIS — A047 Enterocolitis due to Clostridium difficile: Secondary | ICD-10-CM

## 2015-04-26 LAB — SEDIMENTATION RATE: Sed Rate: 85 mm/hr — ABNORMAL HIGH (ref 0–16)

## 2015-04-26 LAB — CBC WITH DIFFERENTIAL/PLATELET
BASOS ABS: 0 10*3/uL (ref 0.0–0.1)
Basophils Relative: 0 % (ref 0–1)
EOS ABS: 0 10*3/uL (ref 0.0–0.7)
Eosinophils Relative: 0 % (ref 0–5)
HEMATOCRIT: 38 % — AB (ref 39.0–52.0)
HEMOGLOBIN: 12.4 g/dL — AB (ref 13.0–17.0)
LYMPHS PCT: 5 % — AB (ref 12–46)
Lymphs Abs: 0.9 10*3/uL (ref 0.7–4.0)
MCH: 30.1 pg (ref 26.0–34.0)
MCHC: 32.6 g/dL (ref 30.0–36.0)
MCV: 92.2 fL (ref 78.0–100.0)
MONO ABS: 0.5 10*3/uL (ref 0.1–1.0)
MONOS PCT: 3 % (ref 3–12)
NEUTROS PCT: 92 % — AB (ref 43–77)
Neutro Abs: 16.6 10*3/uL — ABNORMAL HIGH (ref 1.7–7.7)
PLATELETS: 327 10*3/uL (ref 150–400)
RBC: 4.12 MIL/uL — AB (ref 4.22–5.81)
RDW: 13.9 % (ref 11.5–15.5)
WBC: 18 10*3/uL — ABNORMAL HIGH (ref 4.0–10.5)

## 2015-04-26 LAB — COMPREHENSIVE METABOLIC PANEL
ALT: 24 U/L (ref 0–53)
AST: 38 U/L — AB (ref 0–37)
Albumin: 2.6 g/dL — ABNORMAL LOW (ref 3.5–5.2)
Alkaline Phosphatase: 75 U/L (ref 39–117)
Anion gap: 12 (ref 5–15)
BUN: 14 mg/dL (ref 6–23)
CO2: 21 mmol/L (ref 19–32)
CREATININE: 0.88 mg/dL (ref 0.50–1.35)
Calcium: 8.5 mg/dL (ref 8.4–10.5)
Chloride: 99 mmol/L (ref 96–112)
GFR calc Af Amer: >90 mL/min (ref >90)
GFR calc non Af Amer: 80 mL/min — ABNORMAL LOW (ref >90)
GLUCOSE: 93 mg/dL (ref 70–99)
POTASSIUM: 4.6 mmol/L (ref 3.5–5.1)
Sodium: 132 mmol/L — ABNORMAL LOW (ref 135–145)
TOTAL PROTEIN: 6.2 g/dL (ref 6.0–8.3)
Total Bilirubin: 0.7 mg/dL (ref 0.3–1.2)

## 2015-04-26 LAB — LACTIC ACID, PLASMA: Lactic Acid, Venous: 1.7 mmol/L (ref 0.5–2.0)

## 2015-04-26 LAB — GLUCOSE, CAPILLARY: Glucose-Capillary: 92 mg/dL (ref 70–99)

## 2015-04-26 LAB — PROCALCITONIN: PROCALCITONIN: 0.12 ng/mL

## 2015-04-26 LAB — CLOSTRIDIUM DIFFICILE BY PCR: Toxigenic C. Difficile by PCR: POSITIVE — AB

## 2015-04-26 LAB — VANCOMYCIN, TROUGH: VANCOMYCIN TR: 22 ug/mL — AB (ref 10.0–20.0)

## 2015-04-26 MED ORDER — SODIUM CHLORIDE 0.9 % IV BOLUS (SEPSIS)
250.0000 mL | Freq: Once | INTRAVENOUS | Status: AC
  Administered 2015-04-26: 250 mL via INTRAVENOUS

## 2015-04-26 MED ORDER — ACETAMINOPHEN 325 MG PO TABS
650.0000 mg | ORAL_TABLET | Freq: Four times a day (QID) | ORAL | Status: DC | PRN
  Administered 2015-04-26: 650 mg via ORAL
  Filled 2015-04-26: qty 2

## 2015-04-26 MED ORDER — SODIUM CHLORIDE 0.9 % IV SOLN
INTRAVENOUS | Status: DC
  Administered 2015-04-26 – 2015-04-27 (×2): via INTRAVENOUS
  Filled 2015-04-26 (×5): qty 1000

## 2015-04-26 MED ORDER — HYDRALAZINE HCL 20 MG/ML IJ SOLN
5.0000 mg | INTRAMUSCULAR | Status: DC | PRN
  Filled 2015-04-26: qty 1

## 2015-04-26 MED ORDER — VANCOMYCIN 50 MG/ML ORAL SOLUTION
125.0000 mg | Freq: Four times a day (QID) | ORAL | Status: DC
  Administered 2015-04-26 – 2015-04-28 (×9): 125 mg via ORAL
  Filled 2015-04-26 (×11): qty 2.5

## 2015-04-26 NOTE — Progress Notes (Signed)
SLP Cancellation Note  Patient Details Name: Ryan Lynn MRN: [id number] DOB: 1934-06-01   Cancelled treatment:       Reason Eval/Treat Not Completed: Other (comment). Did not provide PO trials or assessment, but discussed history of dysphagia at length with pt and his wife. The pt reports parotid cx int he 55s with radiation to the neck and resulting fibrosis of the head and neck muscles and documented paralysis of the left arytenoid on CT without any identifiable lesion on the recurrent laryngeal nerve (per notes). A 2014 esophagram documents late sensation of aspiration to the bronchi, delayed swallow and residuals. The pt reports he has been instructed by ENT to use a chin tuck when swallowing but often finds himself choking despite this strategy. The pt reports he has a realistic outlook regarding his swallowing and has been told it will never improve and will probably worsen to a point that he may not be able to safely eat or drink. He typically consumes soft, moist solids, but has never attempted thickened liquids or a water protocol. At this time, bedside swallow assessment is not warranted given pts history and undeniable aspiration. Recommended undergoing a modified barium swallow to assess severity level of dysphagia at this point and to determine if any strategies or modifications would be beneficial. Pt is agreeable to this plan, but feels very ill at this time and would not want to do MBS until he feels better. Will f/u with pt tomorrow am to see how he feels and plan accordingly.   Ryan Lynn, [place] CCC-SLP [phone]  Lynann Beaver 04/26/2015, 2:47 PM

## 2015-04-26 NOTE — Progress Notes (Signed)
Autoimmune and speech eval pending.  Will round formally 04/27/15 and not 04/26/2015  Will get CXR oport 04/27/15   Dr. Brand Males, M.D., [facility].C.P Pulmonary and Critical Care Medicine Staff Physician [place] [facility] Pager: [phone], If no answer or between  15:00h - 7:00h: call [phone]  04/26/2015 9:37 AM

## 2015-04-26 NOTE — Progress Notes (Signed)
ANTIBIOTIC CONSULT NOTE - FOLLOW UP  Pharmacy Consult for vancomycin and cefepime Indication: PNA w/ possible lung abscess   Labs:  Recent Labs  04/24/15 1245 04/25/15 0642  WBC  --  10.0  HGB  --  11.7*  PLT  --  397  CREATININE 0.86 0.83   Estimated Creatinine Clearance: 74.7 mL/min (by C-G formula based on Cr of 0.83).  Recent Labs  04/25/15 2040  VANCOTROUGH 22.0*     Microbiology: Recent Results (from the past 720 hour(s))  Culture, blood (routine x 2)     Status: None (Preliminary result)   Collection Time: 04/21/15  4:15 PM  Result Value Ref Range Status   Specimen Description BLOOD LEFT ARM  Final   Special Requests BOTTLES DRAWN AEROBIC ONLY [place]  Final   Culture   Final           BLOOD CULTURE RECEIVED NO GROWTH TO DATE CULTURE WILL BE HELD FOR 5 DAYS BEFORE ISSUING A FINAL NEGATIVE REPORT Performed at [facility]    Report Status PENDING  Incomplete  Culture, blood (routine x 2)     Status: None (Preliminary result)   Collection Time: 04/21/15  4:44 PM  Result Value Ref Range Status   Specimen Description BLOOD RIGHT ARM  Final   Special Requests BOTTLES DRAWN AEROBIC ONLY 10CC  Final   Culture   Final           BLOOD CULTURE RECEIVED NO GROWTH TO DATE CULTURE WILL BE HELD FOR 5 DAYS BEFORE ISSUING A FINAL NEGATIVE REPORT Performed at [facility]    Report Status PENDING  Incomplete     Assessment/Plan:  81yo male therapeutic on vancomycin with initial dosing for PNA/abscess (trough resulted as 22 but last dose was given late so true trough closer to 15).  Will continue vancomycin and cefepime and continue to monitor.  Wynona Neat, PharmD, BCPS  04/26/2015,12:48 AM

## 2015-04-26 NOTE — Progress Notes (Signed)
CRITICAL VALUE ALERT  Critical value received:  C. Diff PCR = positive  Date of notification:  04-26-2015  Time of notification:  13:14  Critical value read back:Yes.    Nurse who received alert:  Genelle Bal, RN  MD notified (1st page):  [place]  Time of first page:  13:15  MD notified (2nd page):  Time of second page:  Responding MD:  Dyann Kief  Time MD responded:  13:17

## 2015-04-26 NOTE — Progress Notes (Signed)
[facility] PROGRESS NOTE  Ryan Lynn WUG:[id number] DOB: 11-05-1934 DOA: 04/21/2015 PCP: [name], MD  Brief hx: 81 y/o male with PMH significant for HTN, PVC's, severe C. 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 PCCM involved. Currently on IV antibiotics. Patient has complicated course with development of C. Diff again; started on oral vanc on 04/26/15.  Of note, Patient took levaquin for a week prior to admission.  Assessment/Plan: Postobstructive pneumonia with possible right upper lobe abscess -will continue current antibiotic therapy -Repeated CT chest (Continued regression of the posterior segment masslike opacity in the RIGHT upper lobe. New dense consolidation in the anterior medial aspect of the RIGHT upper lobe. New small pleural effusion) -following PCCM rec's will continue antibiotics and if stable will d/c home on antibiotics for 2 weeks therapy. No bronchoscopy at this time. 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 -Patient has been working with this for the past 20 years with ENT (Dr. Lucia Gaskins). -He will continue follow up ENT as an outpatient. -Patient with good techniques and understanding of his ability to swallow certain foods depending food consistency. -following PCCM will ask SPL to see and provide evaluation/treatment rec's to minimize aspiration (evaluation pending)  Moderate protein calorie malnutrition -Dietitian has been consulted -Will follow rec's for feeding supplements   PVCs with a history of chronic palpitations. -Sinus rhythm on telemetry over 24 hours -Rate  controlled -Denies chest pain or palpitations  GERD -Given history of C. difficile will not order PPI. -Will continue the use of tums and PRN simethicone -Patient denies nausea or worsening reflux symptoms  Hypertension -Currently stable -Patient managing with diet control -Will monitor VS  C. Diff colitis -with hx of severe recurrent infection in the past -will initiate treatment with vancomycin orally  High grade fever/leukocytosis Due to C. Diff and post/obstructive PNA Will monitor  Code Status: DNR Family Communication: wife at bedside Disposition Plan: remains inpatient   Consultants:  PCCM (Dr. Nelda Marseille)  Procedures:  See below for x-ray reports   Antibiotics:  Vancomycin 4/21  Cefepime 4/21  Oral vancomycin 4/26  HPI/Subjective: Remains afebrile. No further night sweats and without CP or significant SOB.  Objective: Filed Vitals:   04/26/15 1036  BP: 109/56  Pulse: 99  Temp: 98.9 F (37.2 C)  Resp: 18    Intake/Output Summary (Last 24 hours) at 04/26/15 1353 Last data filed at 04/26/15 1328  Gross per 24 hour  Intake    820 ml  Output   1000 ml  Net   -180 ml   Filed Weights   04/22/15 2055 04/23/15 2135 04/25/15 2135  Weight: 71.079 kg (156 lb 11.2 oz) 72.3 kg (159 lb 6.3 oz) 73.2 kg (161 lb 6 oz)    Exam:   General:  Patient with grade fever; diarrhea and increase on his WBC's to 18,000. Denies CP and reports no significant change in his breathing   Cardiovascular:S1 and S2, no rubs or gallops  Respiratory: no wheezing; positive mild rhonchi and with good air movement   Abdomen: soft, NT, ND, positive BS. Patient reports belly doesn't feel right  Musculoskeletal: no edema, no cyanosis or clubbing  Data Reviewed: Basic Metabolic Panel:  Recent Labs Lab 04/21/15 1644 04/24/15 1245 04/25/15 0642 04/26/15 0443  NA 134* 133* 135 132*  K 4.4 4.6 4.7 4.6  CL 98 98 98 99  CO2 26 27 28 21   GLUCOSE 118* 166* 88 93  BUN 16 10  9 14   CREATININE 0.82 0.86 0.83 0.88  CALCIUM 8.8 8.8 8.8 8.5   Liver Function Tests:  Recent Labs Lab 04/21/15 1644 04/26/15 0443  AST 28 38*  ALT 24 24  ALKPHOS 92 75  BILITOT 0.4 0.7  PROT 7.1 6.2  ALBUMIN 3.0* 2.6*   CBC:  Recent Labs Lab 04/21/15 1644 04/25/15 0642 04/26/15 0443  WBC 12.0* 10.0 18.0*  NEUTROABS  --   --  16.6*  HGB 12.4* 11.7* 12.4*  HCT 37.5* 35.9* 38.0*  MCV 90.6 90.7 92.2  PLT 347 397 327   CBG: No results for input(s): GLUCAP in the last 168 hours.  Recent Results (from the past 240 hour(s))  Culture, blood (routine x 2)     Status: None (Preliminary result)   Collection Time: 04/21/15  4:15 PM  Result Value Ref Range Status   Specimen Description BLOOD LEFT ARM  Final   Special Requests BOTTLES DRAWN AEROBIC ONLY [place]  Final   Culture   Final           BLOOD CULTURE RECEIVED NO GROWTH TO DATE CULTURE WILL BE HELD FOR 5 DAYS BEFORE ISSUING A FINAL NEGATIVE REPORT Performed at [facility]    Report Status PENDING  Incomplete  Culture, blood (routine x 2)     Status: None (Preliminary result)   Collection Time: 04/21/15  4:44 PM  Result Value Ref Range Status   Specimen Description BLOOD RIGHT ARM  Final   Special Requests BOTTLES DRAWN AEROBIC ONLY 10CC  Final   Culture   Final           BLOOD CULTURE RECEIVED NO GROWTH TO DATE CULTURE WILL BE HELD FOR 5 DAYS BEFORE ISSUING A FINAL NEGATIVE REPORT Performed at [facility]    Report Status PENDING  Incomplete  Clostridium Difficile by PCR     Status: Abnormal   Collection Time: 04/26/15  6:52 AM  Result Value Ref Range Status   C difficile by pcr POSITIVE (A) NEGATIVE Final    Comment: CRITICAL RESULT CALLED TO, READ BACK BY AND VERIFIED WITH: A. BRAKE RN 13:15 04/26/15 ([email or web address])      Studies: Dg Chest Port 1 View  04/26/2015   CLINICAL DATA:  81 year old male with pneumonia and shortness of breath. Subsequent encounter.  EXAM: PORTABLE CHEST - 1 VIEW   COMPARISON:  04/23/2015 chest CT. 04/22/2015 chest x-ray. 02/09/2015 PET-CT.  FINDINGS: Masslike consolidation adjacent to the right hilum unchanged.  Peripheral consolidation left lung unchanged.  Please see prior CT and PET-CT report. Follow-up until clearance will be necessary.  No pneumothorax.  Heart size within normal limits.  Mildly ectatic aorta.  IMPRESSION: No significant change since 04/22/2015 chest x-ray. Please see above.   Electronically Signed   By: Genia Del M.D.   On: 04/26/2015 11:06    Scheduled Meds: . ceFEPime (MAXIPIME) IV  2 g Intravenous Q8H  . dorzolamide  1 drop Both Eyes BID  . guaiFENesin  600 mg Oral BID  . heparin subcutaneous  5,000 Units Subcutaneous 3 times per day  . lactose free nutrition  237 mL Oral Q1500  . latanoprost  1 drop Both Eyes QHS  . saccharomyces boulardii  250  mg Oral BID  . vancomycin  125 mg Oral QID  . vancomycin  1,000 mg Intravenous Q12H   Continuous Infusions:   Principal Problem:   Obstructive pneumonia Active Problems:   GERD   Dysphagia, pharyngoesophageal phase   PVC's (premature ventricular contractions)   Lung abscess   History of Clostridium difficile infection   Lung mass   Pulmonary abscess   HCAP (healthcare-associated pneumonia)   Malnutrition of moderate degree   Aspiration pneumonia    Time spent: 30 minutes    Barton Dubois  [facility] Pager [phone]. If 7PM-7AM, please contact night-coverage at [email or web address], password [facility] 04/26/2015, 1:53 PM  LOS: 5 days

## 2015-04-27 ENCOUNTER — Inpatient Hospital Stay (HOSPITAL_COMMUNITY): Payer: Medicare Other

## 2015-04-27 DIAGNOSIS — B9689 Other specified bacterial agents as the cause of diseases classified elsewhere: Secondary | ICD-10-CM

## 2015-04-27 DIAGNOSIS — J851 Abscess of lung with pneumonia: Principal | ICD-10-CM

## 2015-04-27 DIAGNOSIS — Y95 Nosocomial condition: Secondary | ICD-10-CM

## 2015-04-27 DIAGNOSIS — K219 Gastro-esophageal reflux disease without esophagitis: Secondary | ICD-10-CM

## 2015-04-27 LAB — CBC
HEMATOCRIT: 35.1 % — AB (ref 39.0–52.0)
HEMOGLOBIN: 11.6 g/dL — AB (ref 13.0–17.0)
MCH: 29.4 pg (ref 26.0–34.0)
MCHC: 33 g/dL (ref 30.0–36.0)
MCV: 89.1 fL (ref 78.0–100.0)
Platelets: 319 10*3/uL (ref 150–400)
RBC: 3.94 MIL/uL — AB (ref 4.22–5.81)
RDW: 13.9 % (ref 11.5–15.5)
WBC: 15.7 10*3/uL — ABNORMAL HIGH (ref 4.0–10.5)

## 2015-04-27 LAB — URINALYSIS, ROUTINE W REFLEX MICROSCOPIC
BILIRUBIN URINE: NEGATIVE
Glucose, UA: NEGATIVE mg/dL
Hgb urine dipstick: NEGATIVE
Ketones, ur: NEGATIVE mg/dL
Leukocytes, UA: NEGATIVE
Nitrite: NEGATIVE
Protein, ur: NEGATIVE mg/dL
Specific Gravity, Urine: 1.004 — ABNORMAL LOW (ref 1.005–1.030)
Urobilinogen, UA: 0.2 mg/dL (ref 0.0–1.0)
pH: 6.5 (ref 5.0–8.0)

## 2015-04-27 LAB — MPO/PR-3 (ANCA) ANTIBODIES
ANCA Proteinase 3: <3.5 U/mL (ref 0.0–3.5)
Myeloperoxidase Abs: <9 U/mL (ref 0.0–9.0)

## 2015-04-27 MED ORDER — BOOST PLUS PO LIQD
237.0000 mL | Freq: Two times a day (BID) | ORAL | Status: DC
  Administered 2015-04-27 – 2015-04-28 (×3): 237 mL via ORAL
  Filled 2015-04-27 (×6): qty 237

## 2015-04-27 MED ORDER — AMOXICILLIN-POT CLAVULANATE 875-125 MG PO TABS
1.0000 | ORAL_TABLET | Freq: Two times a day (BID) | ORAL | Status: DC
  Administered 2015-04-27 – 2015-04-28 (×3): 1 via ORAL
  Filled 2015-04-27 (×5): qty 1

## 2015-04-27 MED ORDER — CETYLPYRIDINIUM CHLORIDE 0.05 % MT LIQD
7.0000 mL | Freq: Two times a day (BID) | OROMUCOSAL | Status: DC
  Administered 2015-04-27 – 2015-04-28 (×3): 7 mL via OROMUCOSAL

## 2015-04-27 MED ORDER — NYSTATIN 100000 UNIT/ML MT SUSP
5.0000 mL | Freq: Four times a day (QID) | OROMUCOSAL | Status: DC
  Administered 2015-04-27 – 2015-04-28 (×5): 500000 [IU] via ORAL
  Filled 2015-04-27 (×7): qty 5

## 2015-04-27 NOTE — Consult Note (Signed)
Name: Ryan Lynn MRN: [id number] DOB: [birth year]    ADMISSION DATE:  04/21/2015 CONSULTATION DATE:  04/27/2015  REFERRING MD :  Karleen Hampshire  CHIEF COMPLAINT:  Cough, Fever  BRIEF PATIENT DESCRIPTION:    Ryan Lynn is a 81 y.o. M with PMH as outlined below.  He was sent to [facility] on 4/21 as a direct admit from his PCP's office (Dr. Virgina Jock) due to recurrent fevers, SOB, and abnormal outpatient CT scan that showed RUL obstructive PNA with possible abscess formation.  PCCM was consulted for further recs.  He states that starting in middle of March, he began to have cough and fevers for which he was placed on 7 day course of Levaquin.  Symptoms improved for 2 weeks before returning.  He was placed back on Levaquin; however, since then, has had persisted cough that for the most part has been completely dry (has had very minimal sputum production), low grade fevers (though once got to 101).  He denies any chills, wheezing, N/V/D, abd pain, myalgias, fatigue.  He has had occasional pleuritic chest pain associated with coughing and deep breathing only, located on right upper chest.  Has also had 1 or 2 nights where he has experienced night sweats.  He also has hx of pulmonary nodules that have been followed by serial CT scans.  CT from December 2015 showed 29mm nodule in RUL.  PET was obtained in Feb 2016 and showed dramatic increase in the size of confluent mass like density in the RUL, hypermetabolic nodules and lymph nodes in the mediastinum.  Follow up CT scan in March 2016 showed marked regression in size of RUL nodule which supported inflammatory origin; therefore, biopsy was deferred.  ANTIBIOTICS Anti-infectives    Start     Dose/Rate Route Frequency Ordered Stop   04/26/15 1400  vancomycin (VANCOCIN) 50 mg/mL oral solution 125 mg     125 mg Oral 4 times daily 04/26/15 1350 05/10/15 1359   04/22/15 0900  ceFEPIme (MAXIPIME) 2 g in dextrose 5 % 50 mL IVPB     2 g 100 mL/hr over 30 Minutes Intravenous Every 8  hours 04/22/15 0826     04/22/15 0900  vancomycin (VANCOCIN) IVPB 1000 mg/200 mL premix     1,000 mg 200 mL/hr over 60 Minutes Intravenous Every 12 hours 04/22/15 0827     04/21/15 1600  ceFEPIme (MAXIPIME) 2 g in dextrose 5 % 50 mL IVPB     2 g 100 mL/hr over 30 Minutes Intravenous STAT 04/21/15 1552 04/21/15 1630   04/21/15 1600  vancomycin (VANCOCIN) 1,250 mg in sodium chloride 0.9 % 250 mL IVPB     1,250 mg 166.7 mL/hr over 90 Minutes Intravenous STAT 04/21/15 1552 04/21/15 1730        SIGNIFICANT EVENTS  4/21 - admit CT Chest 4/21 >>> RUL obstructive PNA with possible abscess formation (reported so, official read unable to be loaded on hospital computers).  04/25/2015: afebrile since admit. Detailed hx re-take - admits to > 20 years of severe dysphagia due to XRT in neck. Ver compliant with chin tuck but recently signfiicant coughing eesp with ice cream. Describes bad bout few weeks ago. Says things could have gone into lung. Admits to weak cough and hoarse voice. No associated collangen vasc dz   SUBJECTIVE/OVERNIGHT/INTERVAL HX 04/27/2015 Had high fever 103F on > 24h ago with rise in wbc and loose stools- C diff diagnosed . On oral vanc since yesterday. Also on IV cefepime and IV vanc for pneumonia.,  [facility] eval suggests strong possibikyt of aspiration - MBSS pending today per patient     VITAL SIGNS: Temp:  [98.4 F (36.9 C)-99.1 F (37.3 C)] 98.6 F (37 C) (04/27 0939) Pulse Rate:  [88-101] 92 (04/27 0939) Resp:  [17-18] 18 (04/27 0939) BP: (109-125)/(56-68) 121/68 mmHg (04/27 0939) SpO2:  [99 %-100 %] 100 % (04/27 0939) Weight:  [73.6 kg (162 lb 4.1 oz)] 73.6 kg (162 lb 4.1 oz) (04/26 2213)  PHYSICAL EXAMINATION: General: Elderly male, looks same Neuro: A&O x 3, non-focal.  HEENT: [place]/AT. PERRL, sclerae anicteric. WEAK COUGH. HOARSE VOICE opersists unchanged Cardiovascular: RRR, no M/R/G.  Lungs: Respirations even and unlabored.  Faint rhonchi RUL otherwise  CTA. Abdomen: BS x 4, soft, NT/ND.  Musculoskeletal: No gross deformities, no edema.  Skin: Intact, warm, no rashes.    PULMONARY No results for input(s): PHART, PCO2ART, PO2ART, HCO3, TCO2, O2SAT in the last 168 hours.  Invalid input(s): PCO2, PO2  CBC  Recent Labs Lab 04/25/15 0642 04/26/15 0443 04/27/15 0628  HGB 11.7* 12.4* 11.6*  HCT 35.9* 38.0* 35.1*  WBC 10.0 18.0* 15.7*  PLT 397 327 319    COAGULATION No results for input(s): INR in the last 168 hours.  CARDIAC  No results for input(s): TROPONINI in the last 168 hours. No results for input(s): PROBNP in the last 168 hours.   CHEMISTRY  Recent Labs Lab 04/21/15 1644 04/24/15 1245 04/25/15 0642 04/26/15 0443  NA 134* 133* 135 132*  K 4.4 4.6 4.7 4.6  CL 98 98 98 99  CO2 26 27 28 21   GLUCOSE 118* 166* 88 93  BUN 16 10 9 14   CREATININE 0.82 0.86 0.83 0.88  CALCIUM 8.8 8.8 8.8 8.5   Estimated Creatinine Clearance: 70.9 mL/min (by C-G formula based on Cr of 0.88).   LIVER  Recent Labs Lab 04/21/15 1644 04/26/15 0443  AST 28 38*  ALT 24 24  ALKPHOS 92 75  BILITOT 0.4 0.7  PROT 7.1 6.2  ALBUMIN 3.0* 2.6*     INFECTIOUS  Recent Labs Lab 04/26/15 0443  LATICACIDVEN 1.7  PROCALCITON 0.12     ENDOCRINE CBG (last 3)   Recent Labs  04/26/15 1502  GLUCAP 92         IMAGING x48h - personally reviewed image - I disagree with finding cxr is worse; i think is the same Dg Chest Port 1 View  04/27/2015   CLINICAL DATA:  Pneumonia, history of lung abscess lung mass  EXAM: PORTABLE CHEST - 1 VIEW  COMPARISON:  CT scan of the chest of April 23, 2015 and portable chest x-ray of April 26, 2015.  FINDINGS: The lungs are well-expanded. There is persistent soft tissue density masslike lesion in the right hilar region. There are persistent areas of patchy increased alveolar opacity peripherally in the left mid and lower lung. The heart and mediastinal structures are normal. The bony thorax is  unremarkable. There is no visible pleural effusion.  IMPRESSION: Increase conspicuity of pulmonary parenchymal lesions bilaterally worrisome for progressive abscesses-pneumonia.   Electronically Signed   By: Da  Martinique M.D.   On: 04/27/2015 08:04   Dg Chest Port 1 View  04/26/2015   CLINICAL DATA:  81 year old male with pneumonia and shortness of breath. Subsequent encounter.  EXAM: PORTABLE CHEST - 1 VIEW  COMPARISON:  04/23/2015 chest CT. 04/22/2015 chest x-ray. 02/09/2015 PET-CT.  FINDINGS: Masslike consolidation adjacent to the right hilum unchanged.  Peripheral consolidation left lung unchanged.  Please see prior CT  and PET-CT report. Follow-up until clearance will be necessary.  No pneumothorax.  Heart size within normal limits.  Mildly ectatic aorta.  IMPRESSION: No significant change since 04/22/2015 chest x-ray. Please see above.   Electronically Signed   By: Genia Del M.D.   On: 04/26/2015 11:06       ASSESSMENT / PLAN: #RUL -post segment nodule - enlarged Dec 2015-> Feb 2016 and regressed March 2016 (bx canceled) and further regressed April 23016  #RML consolidation / - RML always atbaseline had for years ? Some mild bronchiectasis v scar tissue. Now sudden RML medial segment consolidation new April 2016 (had fevers several times in late march ./mid April 2016). Associated bad dysphagia present  #Dysphagia     - on cxr unchagned. This is more and more looking like aspiration pneumonia. Doubt endobronchial lesion or abscess     Recs: Encouraged him to go through [facility] 04/27/2015 Hold off bronch Await autoimmune Continue empiric abx l total 1-2 weeks but in light of C diff 04/26/15 - dc IV Vanc and called ID Dr Linus Salmons to help guide abx Rx (? Was opd Rx adequate enough and we can dc cefepime now) Albuterol PRN. No role steroids. Pulmonary hygiene. Will fu CT in 6 weeks - if persists then consider    PCCM will follow   Dr. Brand Males, M.D., [facility].C.P Pulmonary and  Critical Care Medicine Staff Physician [facility] Pager: [phone], If no answer or between  15:00h - 7:00h: call [phone]  04/27/2015 9:58 AM

## 2015-04-27 NOTE — Progress Notes (Signed)
NUTRITION FOLLOW UP  Pt meets criteria for NON-SEVERE (MODERATE) MALNUTRITION in the context of chronic illness as evidenced by moderate fat mass loss and moderate to severe muscle mass loss.  DOCUMENTATION CODES Per approved criteria  -Non-severe (moderate) malnutrition in the context of chronic illness   INTERVENTION: Provide Boost Plus po BID, each supplement provides 360 kcal and 14 grams of protein.  Provide nourishment snacks (yogurt). Ordered.  Encourage adequate PO intake.  NUTRITION DIAGNOSIS: Increased nutrient needs related to acute illness as evidenced by estimated nutrition needs; ongoing  Goal: Pt to meet >/= 90% of their estimated nutrition needs; met  Monitor:  PO intake, weight trends, labs, I/O's  Reason for Assessment: MST  81 y.o. male  Admitting Dx: Obstructive pneumonia  ASSESSMENT: Pt with ongoing fevers, cough, abnormal CT scan. CT scan revealed obstructive PNA with possible abscess formation.  C. Diff positive. Per MD note, new dense consolidation in the anterior medial aspect of the R upper lobe, new small pleural effusion. Pt reports having a decreased appetite. Meal completion has been varied from 10-100%. Pt reports swallowing difficulty/dysphagia which has been compromising his intake, however pt reports he has been ordering foods he is able to eat. Pt has been consuming his Boost. RD to increase supplement to aid in caloric and protein needs. Pt additionally reports wanting yogurt as a nourishment snack. RD to order. Pt was encouraged to eat his food at meals and to drink his supplements.  Labs and medications reviewed.  Height: Ht Readings from Last 1 Encounters:  04/22/15 $RemoveB'6\' 2"'WwHBBlEE$  (1.88 m)    Weight: Wt Readings from Last 1 Encounters:  04/26/15 162 lb 4.1 oz (73.6 kg)    BMI:  Body mass index is 20.82 kg/(m^2).  Re-Estimated Nutritional Needs: Kcal: 1850-2100 Protein: 90-100 grams Fluid: 1.85 - 2.1 L/day  Skin: Intact  Diet  Order: Diet regular Room service appropriate?: Yes; Fluid consistency:: Thin   Intake/Output Summary (Last 24 hours) at 04/27/15 1305 Last data filed at 04/27/15 1127  Gross per 24 hour  Intake 3393.33 ml  Output    703 ml  Net 2690.33 ml    Last BM: 4/26-diarrhea  Labs:   Recent Labs Lab 04/24/15 1245 04/25/15 0642 04/26/15 0443  NA 133* 135 132*  K 4.6 4.7 4.6  CL 98 98 99  CO2 $Re'27 28 21  'yMT$ BUN $R'10 9 14  'gP$ CREATININE 0.86 0.83 0.88  CALCIUM 8.8 8.8 8.5  GLUCOSE 166* 88 93    CBG (last 3)   Recent Labs  04/26/15 1502  GLUCAP 92    Scheduled Meds: . ceFEPime (MAXIPIME) IV  2 g Intravenous Q8H  . dorzolamide  1 drop Both Eyes BID  . guaiFENesin  600 mg Oral BID  . heparin subcutaneous  5,000 Units Subcutaneous 3 times per day  . lactose free nutrition  237 mL Oral Q1500  . latanoprost  1 drop Both Eyes QHS  . saccharomyces boulardii  250 mg Oral BID  . vancomycin  125 mg Oral QID    Continuous Infusions: . sodium chloride 0.9 % 1,000 mL with potassium chloride 40 mEq infusion 50 mL/hr at 04/26/15 1520    Past Medical History  Diagnosis Date  . Hypertension   . PVC's (premature ventricular contractions)   . Rectal urgency   . Chest pressure   . Heart palpitations   . Aortic sclerosis   . ED (erectile dysfunction)   . Fatigue   . GERD (gastroesophageal reflux disease)   .  Cancer of parotid gland 1988  . Glaucoma, bilateral     "severe right; moderate left" (04/21/2015)  . Pneumonia 1930's; 04/21/2015    Past Surgical History  Procedure Laterality Date  . Transthoracic echocardiogram  01/20/2010    NORMAL. EF 55-60%  . Cardiovascular stress test      NO ISCHEMIA  . Parotid gland tumor excision  1988    radiation therapy  . Tonsillectomy  1943  . Inguinal hernia repair Right 1999  . Glaucoma surgery Bilateral "several"    "laser"    Kallie Locks, MS, RD, LDN Pager # [phone] After hours/ weekend pager # [phone]

## 2015-04-27 NOTE — Consult Note (Addendum)
Ryan Lynn for Infectious Disease     Reason for Consult: aspiration pneumonia and c diff    Referring Physician: Dr. Johny Chess  Principal Problem:   Obstructive pneumonia Active Problems:   GERD   Dysphagia, pharyngoesophageal phase   PVC's (premature ventricular contractions)   Lung abscess   History of Clostridium difficile infection   Lung mass   Pulmonary abscess   HCAP (healthcare-associated pneumonia)   Malnutrition of moderate degree   Aspiration pneumonia   . ceFEPime (MAXIPIME) IV  2 g Intravenous Q8H  . dorzolamide  1 drop Both Eyes BID  . guaiFENesin  600 mg Oral BID  . heparin subcutaneous  5,000 Units Subcutaneous 3 times per day  . lactose free nutrition  237 mL Oral Q1500  . latanoprost  1 drop Both Eyes QHS  . saccharomyces boulardii  250 mg Oral BID  . vancomycin  125 mg Oral QID    Recommendations: Augmentin 875 bid for 4 weeks ( or until resolution), I do feel he needs continued treatment but for aspiration Vancomycin 125 mg oral qid for duration of Augmentin (in solution, can be compounded by [facility] pharmacy prior to discharge I believe)  CT personally reviewed with the patient  Assessment: He has a history of dysphagia and aspiration and recent issues with fever, but no real cough or productive sputum.  CT with RUL obstructive pneumonia with abscess with some recent regression but a new area on right and left which can occur with aspiration. Also with a history of C diff and here had just 1-2 loose stools but C diff was checked and is positive.  Not sure if true, very mild case or represents colonization.   WBC has gone up since admission and thought to be due to C diff, but may have been aspiration not covered by cefepime which has no anaerobic coverage since it does not correlate with a mild C diff infection.  Allergies noted and no true allergic reactions and he has tolerated amoxicillin before  Antibiotics: Vancomycin and  cefepime  HPI: Ryan Lynn is a 81 y.o. 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 Virgina Jock with fever and reported cough and congestion and started empirically on antibiotics, then a second round and then CT done c/w RUL pneumonia and abscess.  Came for direct admite on 4/21 and started on HCAP therapy with vancomycin and cefepime.  History revealed significant aspiration and dysphagia and repeat CT scan shows waxing and waning infectious areas.     Review of Systems: A comprehensive review of systems was negative.  Past Medical History  Diagnosis Date  . Hypertension   . PVC's (premature ventricular contractions)   . Rectal urgency   . Chest pressure   . Heart palpitations   . Aortic sclerosis   . ED (erectile dysfunction)   . Fatigue   . GERD (gastroesophageal reflux disease)   . Cancer of parotid gland 1988  . Glaucoma, bilateral     "severe right; moderate left" (04/21/2015)  . Pneumonia 1930's; 04/21/2015    History  Substance Use Topics  . Smoking status: Never Smoker   . Smokeless tobacco: Never Used  . Alcohol Use: No    Family History  Problem Relation Age of Onset  . Heart failure Mother   . Cancer Father    Allergies  Allergen Reactions  . Cefdinir  Diarrhea  . Clindamycin/Lincomycin Diarrhea and Other (See Comments)    c diff  . Doxazosin     Low BP and light-headedness  . Doxycycline Hyclate     Dizziness and upset stomach  . Esomeprazole Magnesium Diarrhea  . Simbrinza [Brinzolamide-Brimonidine]     Low BP and light-headedness, fatigue  . Timolol     Causes low  BP and light-headedness    OBJECTIVE: Blood pressure 121/68, pulse 92, temperature 98.6 F (37 C), temperature source Oral, resp. rate 18, height 6\' 2"  (1.88 m), weight 162 lb 4.1 oz (73.6 kg), SpO2 100 %. General: awake, alert, nad Skin: no rashes Lungs: CTA B Cor: RRR Abdomen: soft, nt,  nd Ext: no edema  Microbiology: Recent Results (from the past 240 hour(s))  Culture, blood (routine x 2)     Status: None (Preliminary result)   Collection Time: 04/21/15  4:15 PM  Result Value Ref Range Status   Specimen Description BLOOD LEFT ARM  Final   Special Requests BOTTLES DRAWN AEROBIC ONLY [place]  Final   Culture   Final           BLOOD CULTURE RECEIVED NO GROWTH TO DATE CULTURE WILL BE HELD FOR 5 DAYS BEFORE ISSUING A FINAL NEGATIVE REPORT Performed at [facility]    Report Status PENDING  Incomplete  Culture, blood (routine x 2)     Status: None (Preliminary result)   Collection Time: 04/21/15  4:44 PM  Result Value Ref Range Status   Specimen Description BLOOD RIGHT ARM  Final   Special Requests BOTTLES DRAWN AEROBIC ONLY 10CC  Final   Culture   Final           BLOOD CULTURE RECEIVED NO GROWTH TO DATE CULTURE WILL BE HELD FOR 5 DAYS BEFORE ISSUING A FINAL NEGATIVE REPORT Performed at [facility]    Report Status PENDING  Incomplete  Culture, blood (routine x 2)     Status: None (Preliminary result)   Collection Time: 04/26/15  4:43 AM  Result Value Ref Range Status   Specimen Description BLOOD RIGHT ANTECUBITAL  Final   Special Requests BOTTLES DRAWN AEROBIC ONLY 3CC  Final   Culture   Final           BLOOD CULTURE RECEIVED NO GROWTH TO DATE CULTURE WILL BE HELD FOR 5 DAYS BEFORE ISSUING A FINAL NEGATIVE REPORT Performed at [facility]    Report Status PENDING  Incomplete  Culture, blood (routine x 2)     Status: None (Preliminary result)   Collection Time: 04/26/15  4:55 AM  Result Value Ref Range Status   Specimen Description BLOOD RIGHT ARM  Final   Special Requests BOTTLES DRAWN AEROBIC AND ANAEROBIC 5CC EA  Final   Culture   Final           BLOOD CULTURE RECEIVED NO GROWTH TO DATE CULTURE WILL BE HELD FOR 5 DAYS BEFORE ISSUING A FINAL NEGATIVE REPORT Performed at [facility]    Report Status PENDING  Incomplete   Clostridium Difficile by PCR     Status: Abnormal   Collection Time: 04/26/15  6:52 AM  Result Value Ref Range Status   C difficile by pcr POSITIVE (A) NEGATIVE Final    Comment: CRITICAL RESULT CALLED TO, READ BACK BY AND VERIFIED WITH: A. BRAKE RN 13:15 04/26/15 ([email or web address])     Scharlene Gloss, [place] [facility] [place] [email or web address] [phone] pager  [phone] cell  04/27/2015, 12:51 PM

## 2015-04-27 NOTE — Progress Notes (Signed)
MBSS complete. Full report located under chart review in imaging section.  Rye Dorado Paiewonsky, M.A. CCC-SLP [phone]  

## 2015-04-27 NOTE — Progress Notes (Addendum)
[facility] PROGRESS NOTE  Ryan Lynn RAQ:[id number] DOB: 10-31-1934 DOA: 04/21/2015 PCP: [name], MD  Brief hx: 81 y/o male with PMH significant for HTN, PVC's, severe C. 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 PCCM involved. Currently on IV antibiotics. Patient has complicated course with development of C. Diff again; started on oral vanc on 04/26/15.  Of note, Patient took levaquin for a week prior to admission.  Assessment/Plan: Postobstructive pneumonia with possible right upper lobe abscess -will continue current antibiotic therapy -Repeated CT chest (Continued regression of the posterior segment masslike opacity in the RIGHT upper lobe. New dense consolidation in the anterior medial aspect of the RIGHT upper lobe. New small pleural effusion) -following PCCM,  No bronchoscopy at this time. -minimize aspiration; SPL consulted  -Continue mucinex and flutter valve -Dr Linus Salmons recommend 4 weeks of Augmentin.   Recurrent aspiration with dysphagia -Secondary to history of parotid cancer status post surgery and radiation. -Patient has been working with this for the past 20 years with ENT (Dr. Lucia Gaskins). -He will continue follow up ENT as an outpatient. -Patient with good techniques and understanding of his ability to swallow certain foods depending food consistency. -had swallow evaluation, which showed mild oral and moderate pharyngeal dysphagia.  Speech recommend Regular, thin liquid diet.   C. Diff colitis -with hx of severe recurrent infection in the past -continue with oral vancomycin. Will ask CM to help with oral vancomycin   Oral Candidiasis.  Start nystatin.   Moderate protein calorie malnutrition -Dietitian has been  consulted -continue with supplements.   PVCs with a history of chronic palpitations. -Sinus rhythm on telemetry over 24 hours -Rate controlled -Denies chest pain or palpitations  GERD -Given history of C. difficile will not order PPI. -Will continue the use of tums and PRN simethicone -Patient denies nausea or worsening reflux symptoms  Hypertension -Currently stable -Patient managing with diet control -Will monitor VS  High grade fever/leukocytosis Due to C. Diff and post/obstructive PNA Improved.   Code Status: DNR Family Communication: wife at bedside Disposition Plan: home 4-28 if diarrhea improved and WBC trending down.    Consultants:  PCCM (Dr. Nelda Marseille)  Procedures:  See below for x-ray reports   Antibiotics:  Vancomycin 4/21---4-27  Cefepime 4/21--4-27  Oral vancomycin 4/26  Augmentin 4-27  HPI/Subjective: Denies significant cough. Cough is just occasional.  Denies abdominal pain. Had BM last night and one this morning. Feels diarrhea has improved.   Objective: Filed Vitals:   04/27/15 0939  BP: 121/68  Pulse: 92  Temp: 98.6 F (37 C)  Resp: 18    Intake/Output Summary (Last 24 hours) at 04/27/15 1352 Last data filed at 04/27/15 1127  Gross per 24 hour  Intake 3393.33 ml  Output    703 ml  Net 2690.33 ml   Filed Weights   04/23/15 2135 04/25/15 2135 04/26/15 2213  Weight: 72.3 kg (159 lb 6.3 oz) 73.2 kg (161 lb 6 oz) 73.6 kg (162 lb 4.1 oz)    Exam:   General: NAD.   Cardiovascular:S1 and S2, no rubs or gallops  Respiratory: no wheezing; mild ronchus  Abdomen: soft, NT, ND, positive BS.   Musculoskeletal: no edema, no cyanosis or clubbing  Data Reviewed: Basic Metabolic Panel:  Recent Labs Lab 04/21/15 1644 04/24/15 1245  04/25/15 0642 04/26/15 0443  NA 134* 133* 135 132*  K 4.4 4.6 4.7 4.6  CL 98 98 98 99  CO2 26 27 28 21   GLUCOSE 118* 166* 88 93  BUN 16 10 9 14   CREATININE 0.82 0.86 0.83 0.88  CALCIUM 8.8 8.8  8.8 8.5   Liver Function Tests:  Recent Labs Lab 04/21/15 1644 04/26/15 0443  AST 28 38*  ALT 24 24  ALKPHOS 92 75  BILITOT 0.4 0.7  PROT 7.1 6.2  ALBUMIN 3.0* 2.6*   CBC:  Recent Labs Lab 04/21/15 1644 04/25/15 0642 04/26/15 0443 04/27/15 0628  WBC 12.0* 10.0 18.0* 15.7*  NEUTROABS  --   --  16.6*  --   HGB 12.4* 11.7* 12.4* 11.6*  HCT 37.5* 35.9* 38.0* 35.1*  MCV 90.6 90.7 92.2 89.1  PLT 347 397 327 319   CBG:  Recent Labs Lab 04/26/15 1502  GLUCAP 92    Recent Results (from the past 240 hour(s))  Culture, blood (routine x 2)     Status: None (Preliminary result)   Collection Time: 04/21/15  4:15 PM  Result Value Ref Range Status   Specimen Description BLOOD LEFT ARM  Final   Special Requests BOTTLES DRAWN AEROBIC ONLY [place]  Final   Culture   Final           BLOOD CULTURE RECEIVED NO GROWTH TO DATE CULTURE WILL BE HELD FOR 5 DAYS BEFORE ISSUING A FINAL NEGATIVE REPORT Performed at [facility]    Report Status PENDING  Incomplete  Culture, blood (routine x 2)     Status: None (Preliminary result)   Collection Time: 04/21/15  4:44 PM  Result Value Ref Range Status   Specimen Description BLOOD RIGHT ARM  Final   Special Requests BOTTLES DRAWN AEROBIC ONLY 10CC  Final   Culture   Final           BLOOD CULTURE RECEIVED NO GROWTH TO DATE CULTURE WILL BE HELD FOR 5 DAYS BEFORE ISSUING A FINAL NEGATIVE REPORT Performed at [facility]    Report Status PENDING  Incomplete  Culture, blood (routine x 2)     Status: None (Preliminary result)   Collection Time: 04/26/15  4:43 AM  Result Value Ref Range Status   Specimen Description BLOOD RIGHT ANTECUBITAL  Final   Special Requests BOTTLES DRAWN AEROBIC ONLY 3CC  Final   Culture   Final           BLOOD CULTURE RECEIVED NO GROWTH TO DATE CULTURE WILL BE HELD FOR 5 DAYS BEFORE ISSUING A FINAL NEGATIVE REPORT Performed at [facility]    Report Status PENDING  Incomplete  Culture, blood  (routine x 2)     Status: None (Preliminary result)   Collection Time: 04/26/15  4:55 AM  Result Value Ref Range Status   Specimen Description BLOOD RIGHT ARM  Final   Special Requests BOTTLES DRAWN AEROBIC AND ANAEROBIC 5CC EA  Final   Culture   Final           BLOOD CULTURE RECEIVED NO GROWTH TO DATE CULTURE WILL BE HELD FOR 5 DAYS BEFORE ISSUING A FINAL NEGATIVE REPORT Performed at [facility]    Report Status PENDING  Incomplete  Clostridium Difficile by PCR     Status: Abnormal   Collection Time: 04/26/15  6:52 AM  Result Value Ref Range Status   C difficile by pcr POSITIVE (A) NEGATIVE Final    Comment: CRITICAL  RESULT CALLED TO, READ BACK BY AND VERIFIED WITH: A. BRAKE RN 13:15 04/26/15 ([email or web address])      Studies: Dg Chest Port 1 View  04/27/2015   CLINICAL DATA:  Pneumonia, history of lung abscess lung mass  EXAM: PORTABLE CHEST - 1 VIEW  COMPARISON:  CT scan of the chest of April 23, 2015 and portable chest x-ray of April 26, 2015.  FINDINGS: The lungs are well-expanded. There is persistent soft tissue density masslike lesion in the right hilar region. There are persistent areas of patchy increased alveolar opacity peripherally in the left mid and lower lung. The heart and mediastinal structures are normal. The bony thorax is unremarkable. There is no visible pleural effusion.  IMPRESSION: Increase conspicuity of pulmonary parenchymal lesions bilaterally worrisome for progressive abscesses-pneumonia.   Electronically Signed   By: Coulson  [place] M.D.   On: 04/27/2015 08:04   Dg Chest Port 1 View  04/26/2015   CLINICAL DATA:  81 year old male with pneumonia and shortness of breath. Subsequent encounter.  EXAM: PORTABLE CHEST - 1 VIEW  COMPARISON:  04/23/2015 chest CT. 04/22/2015 chest x-ray. 02/09/2015 PET-CT.  FINDINGS: Masslike consolidation adjacent to the right hilum unchanged.  Peripheral consolidation left lung unchanged.  Please see prior CT and PET-CT report. Follow-up until  clearance will be necessary.  No pneumothorax.  Heart size within normal limits.  Mildly ectatic aorta.  IMPRESSION: No significant change since 04/22/2015 chest x-ray. Please see above.   Electronically Signed   By: Genia Del M.D.   On: 04/26/2015 11:06   Dg Swallowing Func-speech Pathology  04/27/2015    Objective Swallowing Evaluation:    Patient Details  Name: Ryan Lynn MRN: [id number] Date of Birth: [birth year]  Today's Date: 04/27/2015 Time: SLP Start Time (ACUTE ONLY): 1222-SLP Stop Time (ACUTE ONLY): 1254 SLP Time Calculation (min) (ACUTE ONLY): 32 min  Past Medical History:  Past Medical History  Diagnosis Date  . Hypertension   . PVC's (premature ventricular contractions)   . Rectal urgency   . Chest pressure   . Heart palpitations   . Aortic sclerosis   . ED (erectile dysfunction)   . Fatigue   . GERD (gastroesophageal reflux disease)   . Cancer of parotid gland 1988  . Glaucoma, bilateral     "severe right; moderate left" (04/21/2015)  . Pneumonia 1930's; 04/21/2015   Past Surgical History:  Past Surgical History  Procedure Laterality Date  . Transthoracic echocardiogram  01/20/2010    NORMAL. EF 55-60%  . Cardiovascular stress test      NO ISCHEMIA  . Parotid gland tumor excision  1988    radiation therapy  . Tonsillectomy  1943  . Inguinal hernia repair Right 1999  . Glaucoma surgery Bilateral "several"    "laser"   HPI:  HPI: Manas Hickling is a 81 y.o. M with PMH as outlined below. He was sent to  [facility] on 4/21 as a direct admit from his PCP's office (Dr. Virgina Jock) due to  recurrent fevers, SOB, and abnormal outpatient CT scan that showed RUL  obstructive PNA with possible abscess formation. MD history includes the  following, "Admits to > 20 years of severe dysphagia due to XRT in neck.  Very compliant with chin tuck but recently significant coughing especially  with ice cream. Describes bad bout few weeks ago. Says things could have  gone into lung. Admits to weak cough and hoarse voice." Esophagram in 2014   documents aspiration to left bronchus, delayed swallow trigger. There is  no documentation by SLP in the pts chart.   No Data Recorded  Assessment / Plan / Recommendation CHL IP CLINICAL IMPRESSIONS 04/27/2015  Dysphagia Diagnosis Mild oral phase dysphagia;Moderate pharyngeal phase  dysphagia    Clinical impression Pt has a mild oral and moderate pharyngeal dysphagia  with decreased ROM and strength s/p XRT. Orally, pt has mild difficulties  with posterior transit, primarily with solid consistencies, which he  typically compensates for with liquid wash (not needed during this study).  Decreased pharyngeal strength and ROM results in incomplete airway  protection and moderate residuals after the swallow, which are reduced  with cues for dry swallows. Aspiration is observed with nectar thick  liquids only, although thin liquids do reach the level of the true vocal  cords with larger sips. SLP provided Mod cues throughout study for smaller  sips and deeper chin tuck in order for these strategies to effectively  prevent penetration/aspiration during the swallow. Pt remains at risk  after the swallow given residuals as well. Given the above and pt's  familiarity with his swallowing abilities, will continue with regular diet  and thin liquids to allow pt to choose food options that he is more  comfortable swallowing. He will continue to benefit from softer, moister  foods as he eats at home. Thorough education was provided to pt/wife  regarding recommendations including need for smaller sips, deeper chin  tuck, and intermittent throat clear. Will continue to follow.      CHL IP TREATMENT RECOMMENDATION 04/27/2015  Treatment Plan Recommendations Therapy as outlined in treatment plan below      CHL IP DIET RECOMMENDATION 04/27/2015  Diet Recommendations Regular;Thin liquid  Liquid Administration via Cup  Medication Administration Whole meds with puree  Compensations Slow rate;Small sips/bites;Multiple dry swallows after each   bite/sip;Clear throat intermittently  Postural Changes and/or Swallow Maneuvers Seated upright 90  degrees;Upright 30-60 min after meal;Chin tuck     CHL IP OTHER RECOMMENDATIONS 04/27/2015  Recommended Consults (None)  Oral Care Recommendations Oral care BID  Other Recommendations (None)     CHL IP FOLLOW UP RECOMMENDATIONS 04/27/2015  Follow up Recommendations Home health SLP     CHL IP FREQUENCY AND DURATION 04/27/2015  Speech Therapy Frequency (ACUTE ONLY) min 2x/week  Treatment Duration 1 week     Pertinent Vitals/Pain: n/a     SLP Swallow Goals No flowsheet data found.  No flowsheet data found.    CHL IP REASON FOR REFERRAL 04/27/2015  Reason for Referral Objectively evaluate swallowing function     CHL IP ORAL PHASE 04/27/2015  Lips (None)  Tongue (None)  Mucous membranes (None)  Nutritional status (None)  Other (None)  Oxygen therapy (None)  Oral Phase Impaired  Oral - Pudding Teaspoon (None)  Oral - Pudding Cup (None)  Oral - Honey Teaspoon (None)  Oral - Honey Cup (None)  Oral - Honey Syringe (None)  Oral - Nectar Teaspoon (None)  Oral - Nectar Cup WFL  Oral - Nectar Straw (None)  Oral - Nectar Syringe (None)  Oral - Ice Chips (None)  Oral - Thin Teaspoon (None)  Oral - Thin Cup WFL  Oral - Thin Straw (None)  Oral - Thin Syringe (None)  Oral - Puree WFL  Oral - Mechanical Soft Reduced posterior propulsion  Oral - Regular (None)  Oral - Multi-consistency (None)  Oral - Pill Reduced posterior propulsion  Oral Phase - Comment (None)      CHL IP PHARYNGEAL PHASE 04/27/2015  Pharyngeal Phase Impaired  Pharyngeal - Pudding Teaspoon (None)  Penetration/Aspiration details (pudding teaspoon) (None)  Pharyngeal - Pudding Cup (None)  Penetration/Aspiration details (pudding cup) (None)  Pharyngeal - Honey Teaspoon (None)  Penetration/Aspiration details (honey teaspoon) (None)  Pharyngeal - Honey Cup (None)  Penetration/Aspiration details (honey cup) (None)  Pharyngeal - Honey Syringe (None)  Penetration/Aspiration details  (honey syringe) (None)  Pharyngeal - Nectar Teaspoon (None)  Penetration/Aspiration details (nectar teaspoon) (None)  Pharyngeal - Nectar Cup Premature spillage to valleculae;Reduced  pharyngeal peristalsis;Reduced epiglottic inversion;Reduced anterior  laryngeal mobility;Reduced laryngeal elevation;Reduced airway/laryngeal  closure;Reduced tongue base retraction;Penetration/Aspiration during  swallow;Pharyngeal residue - valleculae;Pharyngeal residue - pyriform  sinuses;Pharyngeal residue - posterior pharnyx;Compensatory strategies  attempted (Comment)  Penetration/Aspiration details (nectar cup) Material enters airway, passes  BELOW cords then ejected out  Pharyngeal - Nectar Straw (None)  Penetration/Aspiration details (nectar straw) (None)  Pharyngeal - Nectar Syringe (None)  Penetration/Aspiration details (nectar syringe) (None)  Pharyngeal - Ice Chips (None)  Penetration/Aspiration details (ice chips) (None)  Pharyngeal - Thin Teaspoon (None)  Penetration/Aspiration details (thin teaspoon) (None)  Pharyngeal - Thin Cup Premature spillage to valleculae;Reduced pharyngeal  peristalsis;Reduced epiglottic inversion;Reduced anterior laryngeal  mobility;Reduced laryngeal elevation;Reduced airway/laryngeal  closure;Reduced tongue base retraction;Penetration/Aspiration during  swallow;Pharyngeal residue - valleculae;Pharyngeal residue - pyriform  sinuses;Pharyngeal residue - posterior pharnyx;Compensatory strategies  attempted (Comment)  Penetration/Aspiration details (thin cup) Material enters airway, CONTACTS  cords then ejected out  Pharyngeal - Thin Straw (None)  Penetration/Aspiration details (thin straw) (None)  Pharyngeal - Thin Syringe (None)  Penetration/Aspiration details (thin syringe') (None)  Pharyngeal - Puree Premature spillage to valleculae;Reduced pharyngeal  peristalsis;Reduced epiglottic inversion;Reduced anterior laryngeal  mobility;Reduced laryngeal elevation;Reduced airway/laryngeal   closure;Reduced tongue base retraction;Pharyngeal residue -  valleculae;Pharyngeal residue - pyriform sinuses;Pharyngeal residue -  posterior pharnyx;Compensatory strategies attempted (Comment)  Penetration/Aspiration details (puree) Material does not enter airway  Pharyngeal - Mechanical Soft Premature spillage to valleculae;Reduced  pharyngeal peristalsis;Reduced epiglottic inversion;Reduced anterior  laryngeal mobility;Reduced laryngeal elevation;Reduced airway/laryngeal  closure;Reduced tongue base retraction;Penetration/Aspiration during  swallow;Pharyngeal residue - valleculae;Pharyngeal residue - pyriform  sinuses;Pharyngeal residue - posterior pharnyx;Compensatory strategies  attempted (Comment)  Penetration/Aspiration details (mechanical soft) Material does not enter  airway  Pharyngeal - Regular (None)  Penetration/Aspiration details (regular) (None)  Pharyngeal - Multi-consistency (None)  Penetration/Aspiration details (multi-consistency) (None)  Pharyngeal - Pill Premature spillage to valleculae;Reduced pharyngeal  peristalsis;Reduced epiglottic inversion;Reduced anterior laryngeal  mobility;Reduced laryngeal elevation;Reduced airway/laryngeal  closure;Reduced tongue base retraction;Pharyngeal residue -  valleculae;Pharyngeal residue - pyriform sinuses;Compensatory strategies  attempted (Comment)  Penetration/Aspiration details (pill) Material does not enter airway  Pharyngeal Comment (None)     CHL IP CERVICAL ESOPHAGEAL PHASE 04/27/2015  Cervical Esophageal Phase Impaired  Pudding Teaspoon (None)  Pudding Cup (None)  Honey Teaspoon (None)  Honey Cup (None)  Honey Syringe (None)  Nectar Teaspoon (None)  Nectar Cup (None)  Nectar Straw (None)  Nectar Syringe (None)  Thin Teaspoon (None)  Thin Cup (None)  Thin Straw (None)  Thin Syringe (None)  Cervical Esophageal Comment reduced relaxation         Germain Osgood, M.A. CCC-SLP [phone]  Germain Osgood 04/27/2015, 1:43 PM     Scheduled Meds: .  amoxicillin-clavulanate  1 tablet Oral Q12H  . dorzolamide  1 drop Both Eyes BID  . guaiFENesin  600 mg Oral BID  . heparin subcutaneous  5,000 Units Subcutaneous 3 times per day  . lactose free nutrition  237 mL Oral BID BM  . latanoprost  1 drop Both Eyes QHS  . nystatin  5  mL Oral QID  . saccharomyces boulardii  250 mg Oral BID  . vancomycin  125 mg Oral QID   Continuous Infusions: . sodium chloride 0.9 % 1,000 mL with potassium chloride 40 mEq infusion 50 mL/hr at 04/26/15 1520    Principal Problem:   Obstructive pneumonia Active Problems:   GERD   Dysphagia, pharyngoesophageal phase   PVC's (premature ventricular contractions)   Lung abscess   History of Clostridium difficile infection   Lung mass   Pulmonary abscess   HCAP (healthcare-associated pneumonia)   Malnutrition of moderate degree   Aspiration pneumonia    Time spent: 30 minutes    Shanvi Moyd, [facility] Pager [phone]. If 7PM-7AM, please contact night-coverage at [email or web address], password [facility] 04/27/2015, 1:52 PM  LOS: 6 days

## 2015-04-27 NOTE — Care Management (Signed)
Noted consult for oral Vanco, this pt has medication benefits. The outpt pharmacy along with several other pharmacies offer compounding. Will discuss with pt tomorrow 04/28/2015.  CRoyal RN MPH, case manager, [phone]

## 2015-04-28 LAB — CULTURE, BLOOD (ROUTINE X 2)
CULTURE: NO GROWTH
Culture: NO GROWTH

## 2015-04-28 LAB — BASIC METABOLIC PANEL
Anion gap: 8 (ref 5–15)
BUN: 6 mg/dL (ref 6–23)
CO2: 24 mmol/L (ref 19–32)
Calcium: 8.8 mg/dL (ref 8.4–10.5)
Chloride: 103 mmol/L (ref 96–112)
Creatinine, Ser: 0.79 mg/dL (ref 0.50–1.35)
GFR calc Af Amer: >90 mL/min (ref >90)
GFR, EST NON AFRICAN AMERICAN: 83 mL/min — AB (ref >90)
GLUCOSE: 96 mg/dL (ref 70–99)
POTASSIUM: 4.4 mmol/L (ref 3.5–5.1)
Sodium: 135 mmol/L (ref 135–145)

## 2015-04-28 LAB — CBC
HEMATOCRIT: 40 % (ref 39.0–52.0)
Hemoglobin: 13.1 g/dL (ref 13.0–17.0)
MCH: 29.4 pg (ref 26.0–34.0)
MCHC: 32.8 g/dL (ref 30.0–36.0)
MCV: 89.7 fL (ref 78.0–100.0)
Platelets: 383 10*3/uL (ref 150–400)
RBC: 4.46 MIL/uL (ref 4.22–5.81)
RDW: 14 % (ref 11.5–15.5)
WBC: 9 10*3/uL (ref 4.0–10.5)

## 2015-04-28 LAB — ANTI-DNA ANTIBODY, DOUBLE-STRANDED: ds DNA Ab: <1 IU/mL (ref 0–9)

## 2015-04-28 LAB — URINE CULTURE
Colony Count: NO GROWTH
Culture: NO GROWTH

## 2015-04-28 LAB — PROCALCITONIN: Procalcitonin: 0.2 ng/mL

## 2015-04-28 MED ORDER — NYSTATIN 100000 UNIT/ML MT SUSP: 5.0000 mL | Freq: Four times a day (QID) | OROMUCOSAL | Status: DC

## 2015-04-28 MED ORDER — VANCOMYCIN 50 MG/ML ORAL SOLUTION: 125.0000 mg | Freq: Four times a day (QID) | ORAL | Status: DC

## 2015-04-28 MED ORDER — AMOXICILLIN-POT CLAVULANATE 400-57 MG/5ML PO SUSR
875.0000 mg | Freq: Two times a day (BID) | ORAL | Status: DC
  Filled 2015-04-28 (×2): qty 10.9

## 2015-04-28 MED ORDER — AMOXICILLIN-POT CLAVULANATE 400-57 MG/5ML PO SUSR: 875.0000 mg | Freq: Two times a day (BID) | ORAL | Status: DC

## 2015-04-28 NOTE — Progress Notes (Signed)
Call from hospitalist   - seen by ID - augmentni 4 weeks for aspiration pna = MBSS - confirms aspiration - C Diff - Rx per ID   Plan  - ok for dc without pccm rounds 04/28/2015   - FU Tammy Parrett Tuesday 05/10/15 at [place] Date Time Provider [place]  05/10/2015 3:00 PM Tammy Jeralene Huff, NP [facility] None

## 2015-04-28 NOTE — Progress Notes (Signed)
Patient Discharge:  Disposition: Pt discharged home with wife  Education: Pt educated on medications; follow up appointments and all discharge instructions. Pt and wife verbalized understanding.   IV: Removed.  Telemetry: N/A  Follow-up appointments: Reviewed with pt.  Prescriptions: Scripts given to pt  Transportation: Transported home by wife  Belongings: All belongings taken with pt

## 2015-04-28 NOTE — Discharge Summary (Signed)
Physician Discharge Summary  Ryan Lynn KGU:[id number] DOB: Oct 15, 1934 DOA: 04/21/2015  PCP: Ryan Reel, MD  Admit date: 04/21/2015 Discharge date: 04/28/2015  Time spent: 35 minutes  Recommendations for Outpatient Follow-up:  1. Please follow up autoimmune test that are pending.  2. Follow up with Dr Ryan Lynn for dysphagia.  3. Needs repeat CT chest in 6 weeks.  4. Follow up with pulmonologist   Discharge Diagnoses:    Aspiration pneumonia   C diff colitis   GERD   Dysphagia, pharyngoesophageal phase   PVC's (premature ventricular contractions)   Lung abscess   History of Clostridium difficile infection   Lung mass   Pulmonary abscess   HCAP (healthcare-associated pneumonia)   Malnutrition of moderate degree    Discharge Condition: stable.   Diet recommendation: Regular, thin diet.   Filed Weights   04/23/15 2135 04/25/15 2135 04/26/15 2213  Weight: 72.3 kg (159 lb 6.3 oz) 73.2 kg (161 lb 6 oz) 73.6 kg (162 lb 4.1 oz)    History of present illness:  81 y/o male with PMH significant for HTN, PVC's, severe C. 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 PCCM involved. Currently on IV antibiotics. Patient has complicated course with development of C. Diff again; started on oral vanc on 04/26/15.  Of note, Patient took levaquin for a week prior to admission.   Hospital Course:    Assessment/Plan: Postobstructive pneumonia with possible right upper lobe abscess -will continue current antibiotic therapy -Repeated CT chest (Continued regression of the posterior segment masslike opacity in the RIGHT upper lobe. New dense consolidation in the anterior medial aspect of the RIGHT upper lobe. New small pleural effusion) -following PCCM, No  bronchoscopy at this time. -minimize aspiration; SPL consulted  -Continue mucinex and flutter valve -Dr Ryan Lynn recommend 4 weeks of Augmentin.   Recurrent aspiration with dysphagia -Secondary to history of parotid cancer status post surgery and radiation. -Patient has been working with this for the past 20 years with ENT (Dr. Lucia Lynn). -He will continue follow up ENT as an outpatient. -Patient with good techniques and understanding of his ability to swallow certain foods depending food consistency. -had swallow evaluation, which showed mild oral and moderate pharyngeal dysphagia.  Speech recommend Regular, thin liquid diet.   C. Diff colitis -with hx of severe recurrent infection in the past -continue with oral vancomycin. Need treatment for as long he is on oral antibiotics. at least 4 weeks.   Oral Candidiasis.  Started on  nystatin. Improved.   Moderate protein calorie malnutrition -Dietitian has been consulted -continue with supplements.   PVCs with a history of chronic palpitations. -Sinus rhythm on telemetry over 24 hours -Rate controlled -Denies chest pain or palpitations  GERD -Given history of C. difficile will not order PPI. -Will continue the use of tums and PRN simethicone -Patient denies nausea or worsening reflux symptoms  Hypertension -Currently stable -Patient managing with diet control -Will monitor VS  High grade fever/leukocytosis Due to C. Diff and post/obstructive PNA Resolved.   Procedures:  none  Consultations:  ID  Pulmonary  Discharge Exam: Filed Vitals:   04/28/15 1000  BP: 152/78  Pulse: 108  Temp: 98 F (36.7 C)  Resp: 16    General: NAD Cardiovascular: S 1, S 2 RRR Respiratory: CTA Abdomen; soft, nt  Discharge Instructions   Discharge Instructions    Diet - low sodium heart healthy    Complete by:  As directed      Increase activity slowly    Complete by:  As directed           Current Discharge Medication  List    START taking these medications   Details  amoxicillin-clavulanate (AUGMENTIN) 400-57 MG/5ML suspension Take 10.9 mLs (875 mg total) by mouth 2 (two) times daily. Qty: 1300 mL, Refills: 0    nystatin (MYCOSTATIN) 100000 UNIT/ML suspension Take 5 mLs (500,000 Units total) by mouth 4 (four) times daily. Qty: 60 mL, Refills: 0    vancomycin (VANCOCIN) 50 mg/mL oral solution Take 2.5 mLs (125 mg total) by mouth 4 (four) times daily. Qty: 350 mL, Refills: 0      CONTINUE these medications which have NOT CHANGED   Details  Calcium Carbonate Antacid (TUMS ULTRA 1000 PO) Take 1,000 mg by mouth as needed (for indigestion).     Cholecalciferol (VITAMIN D PO) Take 1 tablet by mouth 2 (two) times daily.     dorzolamide (TRUSOPT) 2 % ophthalmic solution Place 1 drop into both eyes 2 (two) times daily.     ibuprofen (ADVIL,MOTRIN) 200 MG tablet Take 200 mg by mouth every 6 (six) hours as needed for fever.    latanoprost (XALATAN) 0.005 % ophthalmic solution Place 1 drop into both eyes daily.     Probiotic Product (ALIGN) 4 MG CAPS Take 4 mg by mouth daily.     simethicone (MYLICON) 643 MG chewable tablet Chew 125 mg by mouth as needed.     vitamin C (ASCORBIC ACID) 500 MG tablet Take 1,000 mg by mouth 2 (two) times daily.        Allergies  Allergen Reactions  . Cefdinir Diarrhea  . Clindamycin/Lincomycin Diarrhea and Other (See Comments)    c diff  . Doxazosin     Low BP and light-headedness  . Doxycycline Hyclate     Dizziness and upset stomach  . Esomeprazole Magnesium Diarrhea  . Simbrinza [Brinzolamide-Brimonidine]     Low BP and light-headedness, fatigue  . Timolol     Causes low  BP and light-headedness   Follow-up Information    Follow up with Ryan Reel, MD In 2 weeks.   Specialty:  Internal Medicine   Contact information:   [place] [id number] [phone]       Follow up with [facility], NP.   Specialty:  Nurse Practitioner   Why:   05-10-2015 at 3 PM/    Contact information:   [address] [place] [address] [phone]        The results of significant diagnostics from this hospitalization (including imaging, microbiology, ancillary and laboratory) are listed below for reference.    Significant Diagnostic Studies: Ct Chest Wo Contrast  04/23/2015   CLINICAL DATA:  Evaluate for pneumonia. Possible endobronchial lesion.  EXAM: CT CHEST WITHOUT CONTRAST  TECHNIQUE: Multidetector CT imaging of the chest was performed following the standard protocol without IV contrast.  COMPARISON:  CT chest without contrast 02/09/2015. CT chest without contrast 03/07/2015.  FINDINGS: Continued regression of posterior segment RIGHT upper lobe hypermetabolic lesion. New dense consolidation in the anterior and medial aspect of the RIGHT upper lobe. Proximal RIGHT upper lobe bronchus appears patent. New slight LEFT lingular infiltrate not nearly is dense as the RIGHT upper lobe infiltrate. New slight posterior segment LEFT upper lobe infiltrate. New RIGHT pleural  effusion.  Ascending aortic caliber up to 4.2 cm in diameter, stable.  Lymph nodes: Slight decrease pretracheal node image 29 short axis diameter of 9 mm. RIGHT hilar node incompletely evaluated. These are difficult to measure due to the lack of IV contrast.  RIGHT lower lobe nodular densities described on prior CTA appears stable. Stable volume loss and cylindrical bronchiectasis in the lingula.  Unchanged upper abdominal findings of gallstones, segment 2 cyst in the liver. Stable AC joint arthropathy and thoracic scoliosis.  IMPRESSION: Continued regression of the posterior segment masslike opacity in the RIGHT upper lobe.  New dense consolidation in the anterior medial aspect of the RIGHT upper lobe. Correlate clinically for a waxing and waning infectious process. Other areas of new but less consolidative infiltrate in the LEFT lung as described. New RIGHT pleural effusion.    Electronically Signed   By: Rolla Flatten M.D.   On: 04/23/2015 21:57   Dg Chest Port 1 View  04/27/2015   CLINICAL DATA:  Pneumonia, history of lung abscess lung mass  EXAM: PORTABLE CHEST - 1 VIEW  COMPARISON:  CT scan of the chest of April 23, 2015 and portable chest x-ray of April 26, 2015.  FINDINGS: The lungs are well-expanded. There is persistent soft tissue density masslike lesion in the right hilar region. There are persistent areas of patchy increased alveolar opacity peripherally in the left mid and lower lung. The heart and mediastinal structures are normal. The bony thorax is unremarkable. There is no visible pleural effusion.  IMPRESSION: Increase conspicuity of pulmonary parenchymal lesions bilaterally worrisome for progressive abscesses-pneumonia.   Electronically Signed   By: Vontae  Martinique M.D.   On: 04/27/2015 08:04   Dg Chest Port 1 View  04/26/2015   CLINICAL DATA:  81 year old male with pneumonia and shortness of breath. Subsequent encounter.  EXAM: PORTABLE CHEST - 1 VIEW  COMPARISON:  04/23/2015 chest CT. 04/22/2015 chest x-ray. 02/09/2015 PET-CT.  FINDINGS: Masslike consolidation adjacent to the right hilum unchanged.  Peripheral consolidation left lung unchanged.  Please see prior CT and PET-CT report. Follow-up until clearance will be necessary.  No pneumothorax.  Heart size within normal limits.  Mildly ectatic aorta.  IMPRESSION: No significant change since 04/22/2015 chest x-ray. Please see above.   Electronically Signed   By: Genia Del M.D.   On: 04/26/2015 11:06   Dg Chest Port 1 View  04/22/2015   ADDENDUM REPORT: 04/22/2015 14:50  ADDENDUM: CT scan of chest performed on April 21, 2015 at another facility is now available for comparison. This demonstrates the right perihilar opacity seen on current study to most likely represent obstructive pneumonia with possible abscess formation.   Electronically Signed   By: Marijo Conception, M.D.   On: 04/22/2015 14:50   04/22/2015    CLINICAL DATA:  Respiratory failure, shortness of breath.  EXAM: PORTABLE CHEST - 1 VIEW  COMPARISON:  CT scan of March 07, 2015.  FINDINGS: There is the interval development of large right perihilar opacity concerning for inflammation or mass. No pneumothorax or significant pleural effusion is noted. Minimal scarring or subsegmental atelectasis is noted in the left lung base. Bony thorax is intact.  IMPRESSION: New large right perihilar opacity is noted concerning for focal inflammation or possible mass. CT scan of the chest is recommended for further evaluation.  Electronically Signed: By: Marijo Conception, M.D. On: 04/22/2015 07:59   Dg Swallowing Func-speech Pathology  04/27/2015    Objective Swallowing Evaluation:    Patient  Details  Name: Ryan Lynn MRN: [id number] Date of Birth: 1934/11/07  Today's Date: 04/27/2015 Time: SLP Start Time (ACUTE ONLY): 1222-SLP Stop Time (ACUTE ONLY): 1254 SLP Time Calculation (min) (ACUTE ONLY): 32 min  Past Medical History:  Past Medical History  Diagnosis Date  . Hypertension   . PVC's (premature ventricular contractions)   . Rectal urgency   . Chest pressure   . Heart palpitations   . Aortic sclerosis   . ED (erectile dysfunction)   . Fatigue   . GERD (gastroesophageal reflux disease)   . Cancer of parotid gland 1988  . Glaucoma, bilateral     "severe right; moderate left" (04/21/2015)  . Pneumonia 1930's; 04/21/2015   Past Surgical History:  Past Surgical History  Procedure Laterality Date  . Transthoracic echocardiogram  01/20/2010    NORMAL. EF 55-60%  . Cardiovascular stress test      NO ISCHEMIA  . Parotid gland tumor excision  1988    radiation therapy  . Tonsillectomy  1943  . Inguinal hernia repair Right 1999  . Glaucoma surgery Bilateral "several"    "laser"   HPI:  HPI: Harshith Pursell is a 81 y.o. M with PMH as outlined below. He was sent to  [facility] on 4/21 as a direct admit from his PCP's office (Dr. Virgina Jock) due to  recurrent fevers, SOB, and abnormal outpatient CT scan that  showed RUL  obstructive PNA with possible abscess formation. MD history includes the  following, "Admits to > 20 years of severe dysphagia due to XRT in neck.  Very compliant with chin tuck but recently significant coughing especially  with ice cream. Describes bad bout few weeks ago. Says things could have  gone into lung. Admits to weak cough and hoarse voice." Esophagram in 2014  documents aspiration to left bronchus, delayed swallow trigger. There is  no documentation by SLP in the pts chart.   No Data Recorded  Assessment / Plan / Recommendation CHL IP CLINICAL IMPRESSIONS 04/27/2015  Dysphagia Diagnosis Mild oral phase dysphagia;Moderate pharyngeal phase  dysphagia    Clinical impression Pt has a mild oral and moderate pharyngeal dysphagia  with decreased ROM and strength s/p XRT. Orally, pt has mild difficulties  with posterior transit, primarily with solid consistencies, which he  typically compensates for with liquid wash (not needed during this study).  Decreased pharyngeal strength and ROM results in incomplete airway  protection and moderate residuals after the swallow, which are reduced  with cues for dry swallows. Aspiration is observed with nectar thick  liquids only, although thin liquids do reach the level of the true vocal  cords with larger sips. SLP provided Mod cues throughout study for smaller  sips and deeper chin tuck in order for these strategies to effectively  prevent penetration/aspiration during the swallow. Pt remains at risk  after the swallow given residuals as well. Given the above and pt's  familiarity with his swallowing abilities, will continue with regular diet  and thin liquids to allow pt to choose food options that he is more  comfortable swallowing. He will continue to benefit from softer, moister  foods as he eats at home. Thorough education was provided to pt/wife  regarding recommendations including need for smaller sips, deeper chin  tuck, and intermittent throat clear.  Will continue to follow.      CHL IP TREATMENT RECOMMENDATION 04/27/2015  Treatment Plan Recommendations Therapy as outlined in treatment plan below      CHL IP DIET RECOMMENDATION 04/27/2015  Diet Recommendations Regular;Thin liquid  Liquid Administration via Cup  Medication Administration Whole meds with puree  Compensations Slow rate;Small sips/bites;Multiple dry swallows after each  bite/sip;Clear throat intermittently  Postural Changes and/or Swallow Maneuvers Seated upright 90  degrees;Upright 30-60 min after meal;Chin tuck     CHL IP OTHER RECOMMENDATIONS 04/27/2015  Recommended Consults (None)  Oral Care Recommendations Oral care BID  Other Recommendations (None)     CHL IP FOLLOW UP RECOMMENDATIONS 04/27/2015  Follow up Recommendations Home health SLP     CHL IP FREQUENCY AND DURATION 04/27/2015  Speech Therapy Frequency (ACUTE ONLY) min 2x/week  Treatment Duration 1 week     Pertinent Vitals/Pain: n/a     SLP Swallow Goals No flowsheet data found.  No flowsheet data found.    CHL IP REASON FOR REFERRAL 04/27/2015  Reason for Referral Objectively evaluate swallowing function     CHL IP ORAL PHASE 04/27/2015  Lips (None)  Tongue (None)  Mucous membranes (None)  Nutritional status (None)  Other (None)  Oxygen therapy (None)  Oral Phase Impaired  Oral - Pudding Teaspoon (None)  Oral - Pudding Cup (None)  Oral - Honey Teaspoon (None)  Oral - Honey Cup (None)  Oral - Honey Syringe (None)  Oral - Nectar Teaspoon (None)  Oral - Nectar Cup WFL  Oral - Nectar Straw (None)  Oral - Nectar Syringe (None)  Oral - Ice Chips (None)  Oral - Thin Teaspoon (None)  Oral - Thin Cup WFL  Oral - Thin Straw (None)  Oral - Thin Syringe (None)  Oral - Puree WFL  Oral - Mechanical Soft Reduced posterior propulsion  Oral - Regular (None)  Oral - Multi-consistency (None)  Oral - Pill Reduced posterior propulsion  Oral Phase - Comment (None)      CHL IP PHARYNGEAL PHASE 04/27/2015  Pharyngeal Phase Impaired  Pharyngeal - Pudding Teaspoon  (None)  Penetration/Aspiration details (pudding teaspoon) (None)  Pharyngeal - Pudding Cup (None)  Penetration/Aspiration details (pudding cup) (None)  Pharyngeal - Honey Teaspoon (None)  Penetration/Aspiration details (honey teaspoon) (None)  Pharyngeal - Honey Cup (None)  Penetration/Aspiration details (honey cup) (None)  Pharyngeal - Honey Syringe (None)  Penetration/Aspiration details (honey syringe) (None)  Pharyngeal - Nectar Teaspoon (None)  Penetration/Aspiration details (nectar teaspoon) (None)  Pharyngeal - Nectar Cup Premature spillage to valleculae;Reduced  pharyngeal peristalsis;Reduced epiglottic inversion;Reduced anterior  laryngeal mobility;Reduced laryngeal elevation;Reduced airway/laryngeal  closure;Reduced tongue base retraction;Penetration/Aspiration during  swallow;Pharyngeal residue - valleculae;Pharyngeal residue - pyriform  sinuses;Pharyngeal residue - posterior pharnyx;Compensatory strategies  attempted (Comment)  Penetration/Aspiration details (nectar cup) Material enters airway, passes  BELOW cords then ejected out  Pharyngeal - Nectar Straw (None)  Penetration/Aspiration details (nectar straw) (None)  Pharyngeal - Nectar Syringe (None)  Penetration/Aspiration details (nectar syringe) (None)  Pharyngeal - Ice Chips (None)  Penetration/Aspiration details (ice chips) (None)  Pharyngeal - Thin Teaspoon (None)  Penetration/Aspiration details (thin teaspoon) (None)  Pharyngeal - Thin Cup Premature spillage to valleculae;Reduced pharyngeal  peristalsis;Reduced epiglottic inversion;Reduced anterior laryngeal  mobility;Reduced laryngeal elevation;Reduced airway/laryngeal  closure;Reduced tongue base retraction;Penetration/Aspiration during  swallow;Pharyngeal residue - valleculae;Pharyngeal residue - pyriform  sinuses;Pharyngeal residue - posterior pharnyx;Compensatory strategies  attempted (Comment)  Penetration/Aspiration details (thin cup) Material enters airway, CONTACTS  cords then ejected  out  Pharyngeal - Thin Straw (None)  Penetration/Aspiration details (thin straw) (None)  Pharyngeal - Thin Syringe (None)  Penetration/Aspiration details (thin syringe') (None)  Pharyngeal - Puree Premature spillage to valleculae;Reduced pharyngeal  peristalsis;Reduced epiglottic inversion;Reduced anterior laryngeal  mobility;Reduced laryngeal  elevation;Reduced airway/laryngeal  closure;Reduced tongue base retraction;Pharyngeal residue -  valleculae;Pharyngeal residue - pyriform sinuses;Pharyngeal residue -  posterior pharnyx;Compensatory strategies attempted (Comment)  Penetration/Aspiration details (puree) Material does not enter airway  Pharyngeal - Mechanical Soft Premature spillage to valleculae;Reduced  pharyngeal peristalsis;Reduced epiglottic inversion;Reduced anterior  laryngeal mobility;Reduced laryngeal elevation;Reduced airway/laryngeal  closure;Reduced tongue base retraction;Penetration/Aspiration during  swallow;Pharyngeal residue - valleculae;Pharyngeal residue - pyriform  sinuses;Pharyngeal residue - posterior pharnyx;Compensatory strategies  attempted (Comment)  Penetration/Aspiration details (mechanical soft) Material does not enter  airway  Pharyngeal - Regular (None)  Penetration/Aspiration details (regular) (None)  Pharyngeal - Multi-consistency (None)  Penetration/Aspiration details (multi-consistency) (None)  Pharyngeal - Pill Premature spillage to valleculae;Reduced pharyngeal  peristalsis;Reduced epiglottic inversion;Reduced anterior laryngeal  mobility;Reduced laryngeal elevation;Reduced airway/laryngeal  closure;Reduced tongue base retraction;Pharyngeal residue -  valleculae;Pharyngeal residue - pyriform sinuses;Compensatory strategies  attempted (Comment)  Penetration/Aspiration details (pill) Material does not enter airway  Pharyngeal Comment (None)     CHL IP CERVICAL ESOPHAGEAL PHASE 04/27/2015  Cervical Esophageal Phase Impaired  Pudding Teaspoon (None)  Pudding Cup (None)  Honey  Teaspoon (None)  Honey Cup (None)  Honey Syringe (None)  Nectar Teaspoon (None)  Nectar Cup (None)  Nectar Straw (None)  Nectar Syringe (None)  Thin Teaspoon (None)  Thin Cup (None)  Thin Straw (None)  Thin Syringe (None)  Cervical Esophageal Comment reduced relaxation         Germain Osgood, M.A. CCC-SLP [phone]  Germain Osgood 04/27/2015, 1:43 PM     Microbiology: Recent Results (from the past 240 hour(s))  Culture, blood (routine x 2)     Status: None   Collection Time: 04/21/15  4:15 PM  Result Value Ref Range Status   Specimen Description BLOOD LEFT ARM  Final   Special Requests BOTTLES DRAWN AEROBIC ONLY [place]  Final   Culture   Final    NO GROWTH 5 DAYS Performed at [facility]    Report Status 04/28/2015 FINAL  Final  Culture, blood (routine x 2)     Status: None   Collection Time: 04/21/15  4:44 PM  Result Value Ref Range Status   Specimen Description BLOOD RIGHT ARM  Final   Special Requests BOTTLES DRAWN AEROBIC ONLY 10CC  Final   Culture   Final    NO GROWTH 5 DAYS Performed at [facility]    Report Status 04/28/2015 FINAL  Final  Culture, blood (routine x 2)     Status: None (Preliminary result)   Collection Time: 04/26/15  4:43 AM  Result Value Ref Range Status   Specimen Description BLOOD RIGHT ANTECUBITAL  Final   Special Requests BOTTLES DRAWN AEROBIC ONLY 3CC  Final   Culture   Final           BLOOD CULTURE RECEIVED NO GROWTH TO DATE CULTURE WILL BE HELD FOR 5 DAYS BEFORE ISSUING A FINAL NEGATIVE REPORT Performed at [facility]    Report Status PENDING  Incomplete  Culture, blood (routine x 2)     Status: None (Preliminary result)   Collection Time: 04/26/15  4:55 AM  Result Value Ref Range Status   Specimen Description BLOOD RIGHT ARM  Final   Special Requests BOTTLES DRAWN AEROBIC AND ANAEROBIC 5CC EA  Final   Culture   Final           BLOOD CULTURE RECEIVED NO GROWTH TO DATE CULTURE WILL BE HELD FOR 5 DAYS BEFORE ISSUING  A FINAL NEGATIVE REPORT Performed at [facility]  Report Status PENDING  Incomplete  Clostridium Difficile by PCR     Status: Abnormal   Collection Time: 04/26/15  6:52 AM  Result Value Ref Range Status   C difficile by pcr POSITIVE (A) NEGATIVE Final    Comment: CRITICAL RESULT CALLED TO, READ BACK BY AND VERIFIED WITH: A. BRAKE RN 13:15 04/26/15 ([email or web address])      Labs: Basic Metabolic Panel:  Recent Labs Lab 04/21/15 1644 04/24/15 1245 04/25/15 0642 04/26/15 0443 04/28/15 0521  NA 134* 133* 135 132* 135  K 4.4 4.6 4.7 4.6 4.4  CL 98 98 98 99 103  CO2 26 27 28 21 24   GLUCOSE 118* 166* 88 93 96  BUN 16 10 9 14 6   CREATININE 0.82 0.86 0.83 0.88 0.79  CALCIUM 8.8 8.8 8.8 8.5 8.8   Liver Function Tests:  Recent Labs Lab 04/21/15 1644 04/26/15 0443  AST 28 38*  ALT 24 24  ALKPHOS 92 75  BILITOT 0.4 0.7  PROT 7.1 6.2  ALBUMIN 3.0* 2.6*   No results for input(s): LIPASE, AMYLASE in the last 168 hours. No results for input(s): AMMONIA in the last 168 hours. CBC:  Recent Labs Lab 04/21/15 1644 04/25/15 0642 04/26/15 0443 04/27/15 0628 04/28/15 0521  WBC 12.0* 10.0 18.0* 15.7* 9.0  NEUTROABS  --   --  16.6*  --   --   HGB 12.4* 11.7* 12.4* 11.6* 13.1  HCT 37.5* 35.9* 38.0* 35.1* 40.0  MCV 90.6 90.7 92.2 89.1 89.7  PLT 347 397 327 319 383   Cardiac Enzymes: No results for input(s): CKTOTAL, CKMB, CKMBINDEX, TROPONINI in the last 168 hours. BNP: BNP (last 3 results) No results for input(s): BNP in the last 8760 hours.  ProBNP (last 3 results) No results for input(s): PROBNP in the last 8760 hours.  CBG:  Recent Labs Lab 04/26/15 1502  GLUCAP 92       Signed:  Umi Mainor A  [facility] 04/28/2015, 11:16 AM

## 2015-04-28 NOTE — Care Management (Signed)
CARE MANAGEMENT NOTE 04/28/2015  Patient:  Ryan Lynn,Ryan Lynn   Account Number:  [id number]  Date Initiated:  04/28/2015  Documentation initiated by:  Cinde Ebert  Subjective/Objective Assessment:   CM following for progression and d/c planning.     Action/Plan:   04/28/2015 Met with pt and wife, prescriptions called to [facility]. They will compound Vancomycin for this pt and pt will pick up upon discharge.   Anticipated DC Date:  04/28/2015   Anticipated DC Plan:  HOME/SELF CARE         Choice offered to / List presented to:             Status of service:  Completed, signed off Medicare Important Message given?  NO (If response is "NO", the following Medicare IM given date fields will be blank) Date Medicare IM given:   Medicare IM given by:   Date Additional Medicare IM given:   Additional Medicare IM given by:    Discharge Disposition:  HOME/SELF CARE  Per UR Regulation:    If discussed at Long Length of Stay Meetings, dates discussed:    Comments:

## 2015-05-02 LAB — CULTURE, BLOOD (ROUTINE X 2)
CULTURE: NO GROWTH
CULTURE: NO GROWTH

## 2015-05-10 ENCOUNTER — Ambulatory Visit (INDEPENDENT_AMBULATORY_CARE_PROVIDER_SITE_OTHER): Payer: Medicare Other | Admitting: Adult Health

## 2015-05-10 ENCOUNTER — Ambulatory Visit (INDEPENDENT_AMBULATORY_CARE_PROVIDER_SITE_OTHER)
Admission: RE | Admit: 2015-05-10 | Discharge: 2015-05-10 | Disposition: A | Discharge status: Home / Self Care | Payer: Medicare Other | Source: Ambulatory Visit | Attending: Adult Health | Admitting: Adult Health

## 2015-05-10 ENCOUNTER — Encounter: Payer: Self-pay | Admitting: Adult Health

## 2015-05-10 VITALS — BP 122/76 | HR 94 | Temp 98.0°F | Ht 73.0 in | Wt 157.0 lb

## 2015-05-10 DIAGNOSIS — J852 Abscess of lung without pneumonia: Secondary | ICD-10-CM

## 2015-05-10 DIAGNOSIS — J189 Pneumonia, unspecified organism: Secondary | ICD-10-CM

## 2015-05-10 NOTE — Progress Notes (Signed)
Subjective:    Patient ID: Ryan Lynn, male    DOB: Mar 29, 1934, 81 y.o.   MRN: [id number]  HPI  81 year old male seen for initial pulmonary consultation during hospitalization April 25 2015  For aspiration pneumonia  05/10/2015  Post hospital follow-up  Patient presented for post hospital follow-up  Patient was admitted April 21 28 for aspiration pneumonia, C. Difficile colitis, dysphagia, lung mass.  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  The cause of this. He's had chronic aspiration in the past. Patient was treated with aggressive IV antibiotics. Unfortunately, his hospital course was, but could've by C. Difficile.  He was treated with oral vancomycin.  CT chest showed a regressed posterior segment masslike E in the right upper lobe with a new dense consolidation in the right upper lobe. Patient was felt to have a postobstructive pneumonia with a possible right upper lobe abscess.  He is being treated with extended antibiotics with Augmentin for 4 weeks.  Patient does have a history of recurrent aspiration with dysphagia. He follows with ENT in the outpatient setting. He did have a swallow evaluation that showed mild oral and moderate pharyngeal dysphagia  Unfortunately, patient has a history of recurrent C. Difficile colitis. He was discharged on 4 weeks of oral vancomycin.  Since discharge.  feels good but does have fatigue  he remains on Augmentin and oral vancomycin Tolerating abx with no diarrhea.   chest x-ray today shows decreased right hilar opacification   ANCA  antibodies were negative  DOUBLE-STRANDED dna ANTIBODY WAS NEGATIVE   he denies any chest pain, orthopnea, PND, hemoptysis or fever.  Review of Systems Constitutional:   No  weight loss, night sweats,  Fevers, chills,  +fatigue, or  lassitude.  HEENT:   No headaches,  Difficulty swallowing,  Tooth/dental problems, or  Sore throat,                No  sneezing, itching, ear ache, nasal congestion, post nasal drip,   CV:  No chest pain,  Orthopnea, PND, swelling in lower extremities, anasarca, dizziness, palpitations, syncope.   GI  No heartburn, indigestion, abdominal pain, nausea, vomiting, diarrhea, change in bowel habits, loss of appetite, bloody stools.   Resp:  .  No chest wall deformity  Skin: no rash or lesions.  GU: no dysuria, change in color of urine, no urgency or frequency.  No flank pain, no hematuria   MS:  No joint pain or swelling.  No decreased range of motion.  No back pain.  Psych:  No change in mood or affect. No depression or anxiety.  No memory loss.         Objective:   Physical Exam GEN: A/Ox3; pleasant , NAD, thin  HEENT:  /AT,  EACs-clear, TMs-wnl, NOSE-clear, THROAT-clear, no lesions, no postnasal drip or exudate noted.   NECK:  Supple w/ fair ROM; no JVD; normal carotid impulses w/o bruits; no thyromegaly or nodules palpated; no lymphadenopathy.  RESP  Clear  P & A; w/o, wheezes/ rales/ or rhonchi.no accessory muscle use, no dullness to percussion  CARD:  RRR, no m/r/g  , no peripheral edema, pulses intact, no cyanosis or clubbing.  GI:   Soft & nt; nml bowel sounds; no organomegaly or masses detected.  Musco: Warm bil, no deformities or joint swelling noted.   Neuro: alert, no focal deficits noted.    Skin: Warm, no lesions or rashes  CXR today 05/10/2015  Shows improved aeration and decreased right hilar opacity   reviewed independently       Assessment & Plan:

## 2015-05-10 NOTE — Assessment & Plan Note (Signed)
Recurrent aspiration with obstructive PNA  On prolonged abx  cxr is improving   Plan  Finish Augmentin and Vancomycin as directed.  Follow up in 2-3 weeks with Dr. Chase Caller or Cattie Tineo with chest xray Follow up 6 week with Dr. Chase Caller with CT chest  Please contact office for sooner follow up if symptoms do not improve or worsen or seek emergency care

## 2015-05-10 NOTE — Patient Instructions (Signed)
Finish Augmentin and Vancomycin as directed.  Follow up in 2-3 weeks with Dr. Chase Caller or Caia Lofaro with chest xray Follow up 6 week with Dr. Chase Caller with CT chest  Please contact office for sooner follow up if symptoms do not improve or worsen or seek emergency care

## 2015-05-24 ENCOUNTER — Ambulatory Visit (INDEPENDENT_AMBULATORY_CARE_PROVIDER_SITE_OTHER): Payer: Medicare Other | Admitting: Adult Health

## 2015-05-24 ENCOUNTER — Ambulatory Visit (INDEPENDENT_AMBULATORY_CARE_PROVIDER_SITE_OTHER)
Admission: RE | Admit: 2015-05-24 | Discharge: 2015-05-24 | Disposition: A | Discharge status: Home / Self Care | Payer: Medicare Other | Source: Ambulatory Visit | Attending: Adult Health | Admitting: Adult Health

## 2015-05-24 VITALS — BP 124/72 | HR 96 | Temp 98.4°F | Ht 73.0 in | Wt 157.0 lb

## 2015-05-24 DIAGNOSIS — J690 Pneumonitis due to inhalation of food and vomit: Secondary | ICD-10-CM

## 2015-05-24 DIAGNOSIS — J189 Pneumonia, unspecified organism: Secondary | ICD-10-CM

## 2015-05-24 NOTE — Patient Instructions (Signed)
Finish Augmentin and Vancomycin as directed.  Follow up 4 week with Dr. Chase Caller with CT chest as planned  Please contact office for sooner follow up if symptoms do not improve or worsen or seek emergency care

## 2015-05-24 NOTE — Progress Notes (Signed)
Subjective:    Patient ID: Ryan Lynn, male    DOB: 03/04/1934, 81 y.o.   MRN: [id number]  HPI  81 year old male seen for initial pulmonary consultation during hospitalization April 25 2015  For aspiration pneumonia  05/10/2015  Post hospital follow-up  Patient presented for post hospital follow-up  Patient was admitted April 21 28 for aspiration pneumonia, C. Difficile colitis, dysphagia, lung mass.  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    He's had chronic aspiration in the past. Patient was treated with aggressive IV antibiotics. Unfortunately, his hospital course was c/by C. Difficile.  He was treated with oral vancomycin.  CT chest showed a regressed posterior segment masslike  in the right upper lobe with a new dense consolidation in the right upper lobe. Patient was felt to have a postobstructive pneumonia with a possible right upper lobe abscess.  He is being treated with extended antibiotics with Augmentin for 4 weeks.  Patient does have a history of recurrent aspiration with dysphagia. He follows with ENT in the outpatient setting. He did have a swallow evaluation that showed mild oral and moderate pharyngeal dysphagia  Unfortunately, patient has a history of recurrent C. Difficile colitis. He was discharged on 4 weeks of oral vancomycin.  Since discharge.  feels good but does have fatigue  he remains on Augmentin and oral vancomycin Tolerating abx with no diarrhea.   chest x-ray today shows decreased right hilar opacification   ANCA  antibodies were negative  DOUBLE-STRANDED dna ANTIBODY WAS NEGATIVE   he denies any chest pain, orthopnea, PND, hemoptysis or fever. >advised to finish abx   05/24/2015 Followup : Aspiration PNA with ? RUL abscess  Pt returns  for 2 week follow up for PNA .  He had admission 4/21 for post obstructive PNA with possible RUL abscess  . Has recurrent aspiration with previous parotid  cancer s/p surgery and radiation.  CXR last ov showed improvement .  Pt returns feeling improved.  No sign cough . Denies dyspnea.  Has few days left of vanc and augmentin.  Denies diarrhea. Appetite is fair with n/v.    Was dx with shingles few days ago >on Valtrex.           Review of Systems Constitutional:   No  weight loss, night sweats,  Fevers, chills,  +fatigue, or  lassitude.  HEENT:   No headaches,  Difficulty swallowing,  Tooth/dental problems, or  Sore throat,                No sneezing, itching, ear ache, nasal congestion, post nasal drip,   CV:  No chest pain,  Orthopnea, PND, swelling in lower extremities, anasarca, dizziness, palpitations, syncope.   GI  No heartburn, indigestion, abdominal pain, nausea, vomiting, diarrhea, change in bowel habits, loss of appetite, bloody stools.   Resp:  .  No chest wall deformity  Skin: +shingles .  GU: no dysuria, change in color of urine, no urgency or frequency.  No flank pain, no hematuria   MS:  No joint pain or swelling.  No decreased range of motion.  No back pain.  Psych:  No change in mood or affect. No depression or anxiety.  No memory loss.         Objective:   Physical Exam GEN: A/Ox3; pleasant , NAD, thin  HEENT:  [place]/AT,  EACs-clear, TMs-wnl, NOSE-clear, THROAT-clear, no lesions, no postnasal drip or exudate  noted.   NECK:  Supple w/ fair ROM; no JVD; normal carotid impulses w/o bruits; no thyromegaly or nodules palpated; no lymphadenopathy.  RESP  Clear  P & A; w/o, wheezes/ rales/ or rhonchi.no accessory muscle use, no dullness to percussion  CARD:  RRR, no m/r/g  , no peripheral edema, pulses intact, no cyanosis or clubbing.  GI:   Soft & nt; nml bowel sounds; no organomegaly or masses detected.  Musco: Warm bil, no deformities or joint swelling noted.   Neuro: alert, no focal deficits noted.    Skin: Warm, no lesions or rashes   CXR today 05/10/2015  Shows improved aeration and  decreased right hilar opacity   reviewed independently  CXR 05/24/2015  Persistent abn opacity overlying the right hilum , very slightly improved.        Assessment & Plan:

## 2015-05-25 NOTE — Assessment & Plan Note (Signed)
Slowly resolving PNA wth probable lung abscess  Will need to follow closely  Have him finish abx as he is clinically improved and cxr shows continued gradual improvement .  He will have CT chest in 4 weeks as planned.  follow up with Dr. Chase Caller in 4 weeks  Depending on CT results may need FOB still .   Case discussed with Dr. Chase Caller in detail with review.

## 2015-06-21 ENCOUNTER — Ambulatory Visit (INDEPENDENT_AMBULATORY_CARE_PROVIDER_SITE_OTHER)
Admission: RE | Admit: 2015-06-21 | Discharge: 2015-06-21 | Disposition: A | Discharge status: Home / Self Care | Payer: Medicare Other | Source: Ambulatory Visit | Attending: Adult Health | Admitting: Adult Health

## 2015-06-21 DIAGNOSIS — J852 Abscess of lung without pneumonia: Secondary | ICD-10-CM

## 2015-07-05 ENCOUNTER — Ambulatory Visit (INDEPENDENT_AMBULATORY_CARE_PROVIDER_SITE_OTHER): Payer: Medicare Other | Admitting: Internal Medicine

## 2015-07-05 VITALS — BP 142/90 | HR 91 | Ht 72.0 in | Wt 159.0 lb

## 2015-07-05 DIAGNOSIS — J189 Pneumonia, unspecified organism: Secondary | ICD-10-CM

## 2015-07-05 NOTE — Patient Instructions (Addendum)
ICD-9-CM ICD-10-CM   1. Pneumonia, organism unspecified 486 J18.9    CT June 2016 with signficant improvement /near resolution of April 2016 findings Follow std chin tuck and aspiration precautions as before No further specific followup at pulmonary CXR 4 months with PCP [name], MD  No active pulmonary followup Return sooner if needed

## 2015-07-05 NOTE — Progress Notes (Signed)
Subjective:    Patient ID: Ryan Lynn, male    DOB: 10/02/1934, 81 y.o.   MRN: [id number]  HPI 81 year old male seen for initial pulmonary consultation during hospitalization April 25 2015  For aspiration pneumonia  05/10/2015  Post hospital follow-up  Patient presented for post hospital follow-up  Patient was admitted April 21 28 for aspiration pneumonia, C. Difficile colitis, dysphagia, lung mass.  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    He's had chronic aspiration in the past. Patient was treated with aggressive IV antibiotics. Unfortunately, his hospital course was c/by C. Difficile.  He was treated with oral vancomycin.  CT chest showed a regressed posterior segment masslike  in the right upper lobe with a new dense consolidation in the right upper lobe. Patient was felt to have a postobstructive pneumonia with a possible right upper lobe abscess.  He is being treated with extended antibiotics with Augmentin for 4 weeks.  Patient does have a history of recurrent aspiration with dysphagia. He follows with ENT in the outpatient setting. He did have a swallow evaluation that showed mild oral and moderate pharyngeal dysphagia  Unfortunately, patient has a history of recurrent C. Difficile colitis. He was discharged on 4 weeks of oral vancomycin.  Since discharge.  feels good but does have fatigue  he remains on Augmentin and oral vancomycin Tolerating abx with no diarrhea.   chest x-ray today shows decreased right hilar opacification   ANCA  antibodies were negative  DOUBLE-STRANDED dna ANTIBODY WAS NEGATIVE   he denies any chest pain, orthopnea, PND, hemoptysis or fever. >advised to finish abx   05/24/2015 Followup : Aspiration PNA with ? RUL abscess  Pt returns  for 2 week follow up for PNA .  He had admission 4/21 for post obstructive PNA with possible RUL abscess  . Has recurrent aspiration with previous parotid  cancer s/p surgery and radiation.  CXR last ov showed improvement .  Pt returns feeling improved.  No sign cough . Denies dyspnea.  Has few days left of vanc and augmentin.  Denies diarrhea. Appetite is fair with n/v.   OV 07/05/2015  Chief Complaint  Patient presents with  . Follow-up    Pt here after seeing TP for pna. Pt states his breathing is doing well. Pt c/o cough intermittenly when eating. Pt denies CP/tightness.     Follow-up multifocal pneumonia aspiration versus lung abscess; most likely aspiration  Presents with wife. He is feeling great. He follows a standard chin tuck aspiration precautions. There are no new issues. He had a CT scan of the chest 06/21/2015 (personally visualized image) that showed near complete resolution of his airspace disease from April 2016. My nurse practitioner suggested he do a chest x-ray today but he declined. He has some fatigue following all the illnesses but he is exercising and this is improving. There are no acute issues. He does not feel the need to follow-up with pulmonary anymore.   IMPRESSION:  1. Since 04/23/2015, further improvement in multi focal airspace disease, consistent with infection. 2. No evidence of new pneumonia, pulmonary necrosis, or pulmonary abscess. 3. Resolved right pleural effusion. 4. Cholelithiasis. 5. Mild gynecomastia.   Electronically Signed  By: Abigail Miyamoto M.D.  On: 06/21/2015 12:09  Review of Systems  Constitutional: Negative for fever and unexpected weight change.  HENT: Positive for trouble swallowing. Negative for congestion, dental problem, ear pain, nosebleeds, postnasal drip, rhinorrhea, sinus pressure,  sneezing and sore throat.   Eyes: Negative for redness and itching.  Respiratory: Negative for cough, chest tightness, shortness of breath and wheezing.   Cardiovascular: Negative for palpitations and leg swelling.  Gastrointestinal: Negative for nausea and vomiting.  Genitourinary: Negative  for dysuria.  Musculoskeletal: Negative for joint swelling.  Skin: Negative for rash.  Neurological: Negative for headaches.  Hematological: Does not bruise/bleed easily.  Psychiatric/Behavioral: Negative for dysphoric mood. The patient is not nervous/anxious.        Objective:   Physical Exam  Constitutional: He is oriented to person, place, and time. He appears well-developed and well-nourished. No distress.  HENT:  Head: Normocephalic and atraumatic.  Right Ear: External ear normal.  Left Ear: External ear normal.  Mouth/Throat: Oropharynx is clear and moist. No oropharyngeal exudate.  Slight baseline right tilt of the neck due to prior radiation  Eyes: Conjunctivae and EOM are normal. Pupils are equal, round, and reactive to light. Right eye exhibits no discharge. Left eye exhibits no discharge. No scleral icterus.  Neck: Normal range of motion. Neck supple. No JVD present. No tracheal deviation present. No thyromegaly present.  Cardiovascular: Normal rate, regular rhythm and intact distal pulses.  Exam reveals no gallop and no friction rub.   No murmur heard. Pulmonary/Chest: Effort normal and breath sounds normal. No respiratory distress. He has no wheezes. He has no rales. He exhibits no tenderness.  Abdominal: Soft. Bowel sounds are normal. He exhibits no distension and no mass. There is no tenderness. There is no rebound and no guarding.  Musculoskeletal: Normal range of motion. He exhibits no edema or tenderness.  Lymphadenopathy:    He has no cervical adenopathy.  Neurological: He is alert and oriented to person, place, and time. He has normal reflexes. No cranial nerve deficit. Coordination normal.  Baseline slurred speech  Skin: Skin is warm and dry. No rash noted. He is not diaphoretic. No erythema. No pallor.  Psychiatric: He has a normal mood and affect. His behavior is normal. Judgment and thought content normal.  Nursing note and vitals reviewed.   Filed Vitals:    07/05/15 1511  BP: 142/90  Pulse: 91  Height: 6' (1.829 m)  Weight: 159 lb (72.122 kg)  SpO2: 98%         Assessment & Plan:     ICD-9-CM ICD-10-CM   1. Pneumonia, organism unspecified 486 J18.9     CT June 2016 with signficant improvement /near resolution of April 2016 findings Follow std chin tuck and aspiration precautions as before No further specific followup at pulmonary CXR 4 months with PCP [name], MD  No active pulmonary followup Return sooner if needed   Dr. Brand Males, M.D., [facility].C.P Pulmonary and Critical Care Medicine Staff Physician [place] [facility] Pager: [phone], If no answer or between  15:00h - 7:00h: call [phone]  07/05/2015 3:47 PM

## 2015-07-08 ENCOUNTER — Encounter: Payer: Self-pay | Admitting: Internal Medicine

## 2015-10-11 ENCOUNTER — Encounter (HOSPITAL_BASED_OUTPATIENT_CLINIC_OR_DEPARTMENT_OTHER): Payer: Self-pay | Admitting: *Deleted

## 2015-10-14 ENCOUNTER — Other Ambulatory Visit: Payer: Self-pay | Admitting: Otolaryngology

## 2015-10-14 NOTE — H&P (Signed)
PREOPERATIVE H&P  Chief Complaint: trouble swallowing and aspiration  HPI: Ryan Lynn is a 81 y.o. male who presents for evaluation of trouble swallowing and aspiration with a weak voice. 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 He's had no evidence of recurrence but has left vocal cord weakness which contributes to his weak voice and aspiration. He's taken to the OR for left vocal cord medialization and esophageal dilation.  Past Medical History  Diagnosis Date  . PVC's (premature ventricular contractions)   . Rectal urgency   . Chest pressure   . Heart palpitations   . Aortic sclerosis ([place])   . ED (erectile dysfunction)   . Fatigue   . GERD (gastroesophageal reflux disease)   . Cancer of parotid gland ([place]) 1988  . Glaucoma, bilateral     "severe right; moderate left" (04/21/2015)  . Dysphagia   . Pneumonia 1930's; 04/21/2015    hosp 04-25-15 asp pheumonia, c-diff   Past Surgical History  Procedure Laterality Date  . Transthoracic echocardiogram  01/20/2010    NORMAL. EF 55-60%  . Cardiovascular stress test      NO ISCHEMIA  . Parotid gland tumor excision  1988    radiation therapy  . Tonsillectomy  1943  . Inguinal hernia repair Right 1999  . Glaucoma surgery Bilateral "several"    "laser"   Social History   Social History  . Marital Status: Married    Spouse Name: N/A  . Number of Children: 2  . Years of Education: N/A   Occupational History  . Retired    Social History Main Topics  . Smoking status: Never Smoker   . Smokeless tobacco: Never Used  . Alcohol Use: No  . Drug Use: No  . Sexual Activity: No   Other Topics Concern  . None   Social History Narrative   Family History  Problem Relation Age of Onset  . Heart failure Mother   . Cancer Father    Allergies  Allergen Reactions  . Cefdinir Diarrhea  . Clindamycin/Lincomycin Diarrhea and Other (See Comments)    c  diff  . Doxazosin     Low BP and light-headedness  . Doxycycline Hyclate     Dizziness and upset stomach  . Esomeprazole Magnesium Diarrhea  . Simbrinza [Brinzolamide-Brimonidine]     Low BP and light-headedness, fatigue  . Timolol     Causes low  BP and light-headedness   Prior to Admission medications   Medication Sig Start Date End Date Taking? Authorizing Provider  Calcium Carbonate Antacid (TUMS ULTRA 1000 PO) Take 1,000 mg by mouth as needed (for indigestion).    Yes Historical Provider, MD  dorzolamide (TRUSOPT) 2 % ophthalmic solution Place 1 drop into both eyes 2 (two) times daily.  06/02/12  Yes Historical Provider, MD  latanoprost (XALATAN) 0.005 % ophthalmic solution Place 1 drop into both eyes daily.  09/29/13  Yes Historical Provider, MD  Multiple Vitamin (MULTIVITAMIN WITH MINERALS) TABS tablet Take 1 tablet by mouth daily.   Yes Historical Provider, MD  Probiotic Product (ALIGN) 4 MG CAPS Take 4 mg by mouth daily.    Yes Historical Provider, MD  simethicone (MYLICON) 815 MG chewable tablet Chew 125 mg by mouth as needed.    Yes Historical Provider, MD  ibuprofen (ADVIL,MOTRIN) 200 MG tablet Take 200 mg by mouth every 6 (six) hours as needed for fever.    Historical Provider,  MD     Positive ROS: per HPI  All other systems have been reviewed and were otherwise negative with the exception of those mentioned in the HPI and as above.  Physical Exam: There were no vitals filed for this visit.  General: Alert, no acute distress Oral: Normal oral mucosa and tonsils Nasal: Clear nasal passages.  FOL reveals left vocal cord weakness and bowing Neck: No palpable adenopathy or thyroid nodules. S/p left neck dissection. Ear: Ear canal is clear with normal appearing TMs Cardiovascular: Regular rate and rhythm, no murmur.  Respiratory: Clear to auscultation Neurologic: Alert and oriented x 3   Assessment/Plan: [place] for Procedure(s): VOCAL CORD  MEDIALIZATION THYROPLASTY ESOPHAGEAL DILATION   Ryan Overly, MD 10/14/2015 5:09 PM

## 2015-10-18 ENCOUNTER — Ambulatory Visit (HOSPITAL_BASED_OUTPATIENT_CLINIC_OR_DEPARTMENT_OTHER)
Admission: RE | Admit: 2015-10-18 | Discharge: 2015-10-18 | Disposition: A | Discharge status: Home / Self Care | Payer: Medicare Other | Source: Ambulatory Visit | Attending: Otolaryngology | Admitting: Otolaryngology

## 2015-10-18 ENCOUNTER — Encounter (HOSPITAL_BASED_OUTPATIENT_CLINIC_OR_DEPARTMENT_OTHER)
Admission: RE | Disposition: A | Discharge status: Home / Self Care | Payer: Self-pay | Source: Ambulatory Visit | Attending: Otolaryngology

## 2015-10-18 ENCOUNTER — Encounter (HOSPITAL_BASED_OUTPATIENT_CLINIC_OR_DEPARTMENT_OTHER): Payer: Self-pay | Admitting: *Deleted

## 2015-10-18 ENCOUNTER — Ambulatory Visit (HOSPITAL_BASED_OUTPATIENT_CLINIC_OR_DEPARTMENT_OTHER): Payer: Medicare Other | Admitting: Anesthesiology

## 2015-10-18 DIAGNOSIS — R490 Dysphonia: Secondary | ICD-10-CM | POA: Diagnosis present

## 2015-10-18 DIAGNOSIS — Z923 Personal history of irradiation: Secondary | ICD-10-CM | POA: Diagnosis not present

## 2015-10-18 DIAGNOSIS — K219 Gastro-esophageal reflux disease without esophagitis: Secondary | ICD-10-CM | POA: Insufficient documentation

## 2015-10-18 DIAGNOSIS — Z85819 Personal history of malignant neoplasm of unspecified site of lip, oral cavity, and pharynx: Secondary | ICD-10-CM | POA: Insufficient documentation

## 2015-10-18 DIAGNOSIS — R1310 Dysphagia, unspecified: Secondary | ICD-10-CM | POA: Insufficient documentation

## 2015-10-18 DIAGNOSIS — Z791 Long term (current) use of non-steroidal anti-inflammatories (NSAID): Secondary | ICD-10-CM | POA: Diagnosis not present

## 2015-10-18 DIAGNOSIS — Z79899 Other long term (current) drug therapy: Secondary | ICD-10-CM | POA: Insufficient documentation

## 2015-10-18 DIAGNOSIS — J3801 Paralysis of vocal cords and larynx, unilateral: Secondary | ICD-10-CM | POA: Insufficient documentation

## 2015-10-18 HISTORY — PX: THYROPLASTY: SHX2512

## 2015-10-18 HISTORY — DX: Dysphagia, unspecified: R13.10

## 2015-10-18 HISTORY — PX: LARYNGOPLASTY: SHX282

## 2015-10-18 HISTORY — PX: ESOPHAGEAL DILATION: SHX303

## 2015-10-18 SURGERY — LARYNGOPLASTY
Anesthesia: General | Site: Throat

## 2015-10-18 MED ORDER — ONDANSETRON HCL 4 MG/2ML IJ SOLN: 4.0000 mg | Freq: Four times a day (QID) | INTRAMUSCULAR | Status: DC | PRN

## 2015-10-18 MED ORDER — BACITRACIN ZINC 500 UNIT/GM EX OINT
TOPICAL_OINTMENT | CUTANEOUS | Status: AC
  Filled 2015-10-18: qty 0.9

## 2015-10-18 MED ORDER — OXYCODONE HCL 5 MG PO TABS: 5.0000 mg | ORAL_TABLET | Freq: Once | ORAL | Status: DC | PRN

## 2015-10-18 MED ORDER — MIDAZOLAM HCL 2 MG/2ML IJ SOLN
1.0000 mg | INTRAMUSCULAR | Status: DC | PRN
Start: 2015-10-18 — End: 2015-10-18

## 2015-10-18 MED ORDER — DEXAMETHASONE SODIUM PHOSPHATE 4 MG/ML IJ SOLN
INTRAMUSCULAR | Status: DC | PRN
  Administered 2015-10-18: 10 mg via INTRAVENOUS

## 2015-10-18 MED ORDER — COCAINE HCL 4 % EX SOLN
CUTANEOUS | Status: AC
  Filled 2015-10-18: qty 4

## 2015-10-18 MED ORDER — CEFAZOLIN SODIUM-DEXTROSE 2-3 GM-% IV SOLR
INTRAVENOUS | Status: AC
  Filled 2015-10-18: qty 50

## 2015-10-18 MED ORDER — PHENYLEPHRINE HCL 10 MG/ML IJ SOLN
INTRAMUSCULAR | Status: DC | PRN
  Administered 2015-10-18: 40 ug via INTRAVENOUS

## 2015-10-18 MED ORDER — ONDANSETRON HCL 4 MG/2ML IJ SOLN
INTRAMUSCULAR | Status: AC
  Filled 2015-10-18: qty 2

## 2015-10-18 MED ORDER — DEXAMETHASONE SODIUM PHOSPHATE 10 MG/ML IJ SOLN
INTRAMUSCULAR | Status: AC
  Filled 2015-10-18: qty 1

## 2015-10-18 MED ORDER — PROPOFOL 500 MG/50ML IV EMUL
INTRAVENOUS | Status: AC
Start: 2015-10-18 — End: 2015-10-18
  Filled 2015-10-18: qty 50

## 2015-10-18 MED ORDER — COCAINE HCL 4 % EX SOLN
CUTANEOUS | Status: DC | PRN
  Administered 2015-10-18: 2 mL via NASAL

## 2015-10-18 MED ORDER — LIDOCAINE-EPINEPHRINE 1 %-1:100000 IJ SOLN
INTRAMUSCULAR | Status: DC | PRN
  Administered 2015-10-18: 9 mL

## 2015-10-18 MED ORDER — LIDOCAINE HCL (CARDIAC) 20 MG/ML IV SOLN
INTRAVENOUS | Status: AC
  Filled 2015-10-18: qty 5

## 2015-10-18 MED ORDER — ONDANSETRON HCL 4 MG/2ML IJ SOLN
INTRAMUSCULAR | Status: DC | PRN
  Administered 2015-10-18: 4 mg via INTRAVENOUS

## 2015-10-18 MED ORDER — HYDROCODONE-ACETAMINOPHEN 5-325 MG PO TABS: 1.0000 | ORAL_TABLET | Freq: Four times a day (QID) | ORAL | Status: DC | PRN

## 2015-10-18 MED ORDER — OXYCODONE HCL 5 MG/5ML PO SOLN: 5.0000 mg | Freq: Once | ORAL | Status: DC | PRN

## 2015-10-18 MED ORDER — FENTANYL CITRATE (PF) 100 MCG/2ML IJ SOLN
INTRAMUSCULAR | Status: AC
  Filled 2015-10-18: qty 4

## 2015-10-18 MED ORDER — FENTANYL CITRATE (PF) 100 MCG/2ML IJ SOLN: 50.0000 ug | INTRAMUSCULAR | Status: DC | PRN

## 2015-10-18 MED ORDER — GLYCOPYRROLATE 0.2 MG/ML IJ SOLN
0.2000 mg | Freq: Once | INTRAMUSCULAR | Status: DC | PRN
Start: 2015-10-18 — End: 2015-10-18

## 2015-10-18 MED ORDER — ARTIFICIAL TEARS OP OINT
TOPICAL_OINTMENT | OPHTHALMIC | Status: AC
  Filled 2015-10-18: qty 3.5

## 2015-10-18 MED ORDER — LACTATED RINGERS IV SOLN
INTRAVENOUS | Status: DC
  Administered 2015-10-18 (×2): via INTRAVENOUS

## 2015-10-18 MED ORDER — PROPOFOL 10 MG/ML IV BOLUS
INTRAVENOUS | Status: DC | PRN
  Administered 2015-10-18: 10 mg via INTRAVENOUS
  Administered 2015-10-18: 40 mg via INTRAVENOUS
  Administered 2015-10-18 (×2): 10 mg via INTRAVENOUS
  Administered 2015-10-18: 250 mg via INTRAVENOUS
  Administered 2015-10-18 (×2): 20 mg via INTRAVENOUS

## 2015-10-18 MED ORDER — CEPHALEXIN 500 MG PO CAPS: 500.0000 mg | ORAL_CAPSULE | Freq: Two times a day (BID) | ORAL | Status: DC

## 2015-10-18 MED ORDER — PROPOFOL 10 MG/ML IV BOLUS
INTRAVENOUS | Status: AC
  Filled 2015-10-18: qty 20

## 2015-10-18 MED ORDER — EPINEPHRINE HCL 1 MG/ML IJ SOLN
INTRAMUSCULAR | Status: AC
  Filled 2015-10-18: qty 1

## 2015-10-18 MED ORDER — LIDOCAINE-EPINEPHRINE 1 %-1:100000 IJ SOLN
INTRAMUSCULAR | Status: AC
  Filled 2015-10-18: qty 1

## 2015-10-18 MED ORDER — SUCCINYLCHOLINE CHLORIDE 20 MG/ML IJ SOLN
INTRAMUSCULAR | Status: DC | PRN
  Administered 2015-10-18: 40 mg via INTRAVENOUS

## 2015-10-18 MED ORDER — SCOPOLAMINE 1 MG/3DAYS TD PT72: 1.0000 | MEDICATED_PATCH | Freq: Once | TRANSDERMAL | Status: DC | PRN

## 2015-10-18 MED ORDER — CEFAZOLIN SODIUM-DEXTROSE 2-3 GM-% IV SOLR
2.0000 g | INTRAVENOUS | Status: AC
  Administered 2015-10-18: 2 g via INTRAVENOUS

## 2015-10-18 MED ORDER — METHYLENE BLUE 1 % INJ SOLN
INTRAMUSCULAR | Status: AC
  Filled 2015-10-18: qty 10

## 2015-10-18 MED ORDER — FENTANYL CITRATE (PF) 100 MCG/2ML IJ SOLN: 25.0000 ug | INTRAMUSCULAR | Status: DC | PRN

## 2015-10-18 SURGICAL SUPPLY — 67 items
APL SKNCLS STERI-STRIP NONHPOA (GAUZE/BANDAGES/DRESSINGS)
ATTRACTOMAT 16X20 MAGNETIC DRP (DRAPES) IMPLANT
BENZOIN TINCTURE PRP APPL 2/3 (GAUZE/BANDAGES/DRESSINGS) IMPLANT
BLADE OSC/SAG .038X5.5 CUT EDG (BLADE) IMPLANT
BLADE SURG 10 STRL SS (BLADE) ×4 IMPLANT
BLADE SURG 15 STRL LF DISP TIS (BLADE) IMPLANT
BLADE SURG 15 STRL SS (BLADE)
BUR FAST CUTTING MED (BURR) ×2 IMPLANT
BUR FISSURE CARBIDE (BURR) IMPLANT
BUR ROUND CARBIDE (BURR) IMPLANT
CANISTER SUCT 1200ML W/VALVE (MISCELLANEOUS) ×4 IMPLANT
CLEANER CAUTERY TIP 5X5 PAD (MISCELLANEOUS) ×2 IMPLANT
CLOSURE WOUND 1/4X4 (GAUZE/BANDAGES/DRESSINGS)
CONT SPEC 4OZ CLIKSEAL STRL BL (MISCELLANEOUS) IMPLANT
CORDS BIPOLAR (ELECTRODE) IMPLANT
COVER MAYO STAND STRL (DRAPES) ×2 IMPLANT
ELECT COATED BLADE 2.86 ST (ELECTRODE) ×4 IMPLANT
ELECT NDL BLADE 2-5/6 (NEEDLE) IMPLANT
ELECT NEEDLE BLADE 2-5/6 (NEEDLE) IMPLANT
ELECT REM PT RETURN 9FT ADLT (ELECTROSURGICAL) ×4
ELECTRODE REM PT RTRN 9FT ADLT (ELECTROSURGICAL) ×2 IMPLANT
GAUZE PACKING 1/2X5YD (GAUZE/BANDAGES/DRESSINGS) IMPLANT
GLOVE EXAM NITRILE EXT CUFF MD (GLOVE) ×2 IMPLANT
GLOVE SS BIOGEL STRL SZ 7.5 (GLOVE) ×2 IMPLANT
GLOVE SUPERSENSE BIOGEL SZ 7.5 (GLOVE) ×2
GLOVE SURG SS PI 7.0 STRL IVOR (GLOVE) ×2 IMPLANT
GOWN STRL REUS W/ TWL LRG LVL3 (GOWN DISPOSABLE) ×2 IMPLANT
GOWN STRL REUS W/ TWL XL LVL3 (GOWN DISPOSABLE) IMPLANT
GOWN STRL REUS W/TWL LRG LVL3 (GOWN DISPOSABLE) ×4
GOWN STRL REUS W/TWL XL LVL3 (GOWN DISPOSABLE) ×4
GUARD TEETH (MISCELLANEOUS) ×2 IMPLANT
LIQUID BAND (GAUZE/BANDAGES/DRESSINGS) IMPLANT
NDL HYPO 18GX1.5 BLUNT FILL (NEEDLE) IMPLANT
NDL HYPO 25X1 1.5 SAFETY (NEEDLE) ×4 IMPLANT
NDL SPNL 22GX7 QUINCKE BK (NEEDLE) IMPLANT
NEEDLE HYPO 18GX1.5 BLUNT FILL (NEEDLE) IMPLANT
NEEDLE HYPO 25X1 1.5 SAFETY (NEEDLE) ×4 IMPLANT
NEEDLE SPNL 22GX7 QUINCKE BK (NEEDLE) IMPLANT
NS IRRIG 1000ML POUR BTL (IV SOLUTION) ×4 IMPLANT
PACK BASIN DAY SURGERY FS (CUSTOM PROCEDURE TRAY) ×4 IMPLANT
PACK ENT DAY SURGERY (CUSTOM PROCEDURE TRAY) ×4 IMPLANT
PAD CLEANER CAUTERY TIP 5X5 (MISCELLANEOUS)
PATTIES SURGICAL .5 X3 (DISPOSABLE) ×4 IMPLANT
PENCIL BUTTON HOLSTER BLD 10FT (ELECTRODE) ×4 IMPLANT
RUBBERBAND STERILE (MISCELLANEOUS) IMPLANT
SHEET MEDIUM DRAPE 40X70 STRL (DRAPES) ×2 IMPLANT
SLEEVE SCD COMPRESS KNEE MED (MISCELLANEOUS) ×4 IMPLANT
SLEEVE SURGEON STRL (DRAPES) ×2 IMPLANT
SOLUTION ANTI FOG 6CC (MISCELLANEOUS) ×4 IMPLANT
SPONGE GAUZE 4X4 12PLY STER LF (GAUZE/BANDAGES/DRESSINGS) ×4 IMPLANT
STRIP CLOSURE SKIN 1/4X4 (GAUZE/BANDAGES/DRESSINGS) IMPLANT
SURGILUBE 2OZ TUBE FLIPTOP (MISCELLANEOUS) ×6 IMPLANT
SUT CHROMIC 3 0 PS 2 (SUTURE) ×6 IMPLANT
SUT CHROMIC 4 0 P 3 18 (SUTURE) IMPLANT
SUT ETHILON 4 0 P 3 18 (SUTURE) IMPLANT
SUT ETHILON 5 0 P 3 18 (SUTURE) ×2
SUT NYLON ETHILON 5-0 P-3 1X18 (SUTURE) IMPLANT
SUT SILK 4 0 TIES 17X18 (SUTURE) IMPLANT
SUT VIC AB 5-0 P-3 18X BRD (SUTURE) IMPLANT
SUT VIC AB 5-0 P3 18 (SUTURE)
SYR 5ML LL (SYRINGE) IMPLANT
SYR BULB 3OZ (MISCELLANEOUS) IMPLANT
SYR CONTROL 10ML LL (SYRINGE) IMPLANT
TOWEL OR 17X24 6PK STRL BLUE (TOWEL DISPOSABLE) ×4 IMPLANT
TRAY DSU PREP LF (CUSTOM PROCEDURE TRAY) ×4 IMPLANT
TUBE CONNECTING 20'X1/4 (TUBING)
TUBE CONNECTING 20X1/4 (TUBING) ×2 IMPLANT

## 2015-10-18 NOTE — Interval H&P Note (Signed)
History and Physical Interval Note:  10/18/2015 7:43 AM  Ryan Lynn  has presented today for surgery, with the diagnosis of HOARSENESS  AND DYSPHAGIA  The various methods of treatment have been discussed with the patient and family. After consideration of risks, benefits and other options for treatment, the patient has consented to  Procedure(s): VOCAL CORD MEDIALIZATION (N/A) THYROPLASTY (N/A) ESOPHAGEAL DILATION (N/A) as a surgical intervention .  The patient's history has been reviewed, patient examined, no change in status, stable for surgery.  I have reviewed the patient's chart and labs.  Questions were answered to the patient's satisfaction.     Rivers Hamrick

## 2015-10-18 NOTE — Brief Op Note (Signed)
10/18/2015  10:31 AM  PATIENT:  Kathryne Hitch  81 y.o. male  PRE-OPERATIVE DIAGNOSIS:  HOARSENESS  AND DYSPHAGIA  POST-OPERATIVE DIAGNOSIS:  HOARSENESS  AND DYSPHAGIA  PROCEDURE:  Procedure(s): VOCAL CORD MEDIALIZATION (N/A) THYROPLASTY (N/A) ESOPHAGEAL DILATION (N/A)  SURGEON:  Surgeon(s) and Role:    * Rozetta Nunnery, MD - Primary  PHYSICIAN ASSISTANT:   ASSISTANTS: none   ANESTHESIA:   general  EBL:  Total I/O In: 1000 [I.V.:1000] Out: -   BLOOD ADMINISTERED:none  DRAINS: none   LOCAL MEDICATIONS USED:  XYLOCAINE with EPI  10 cc  SPECIMEN:  No Specimen  DISPOSITION OF SPECIMEN:  N/A  COUNTS:  YES  TOURNIQUET:  * No tourniquets in log *  DICTATION: .Other Dictation: Dictation Number [id number]  PLAN OF CARE: Discharge to home after PACU  PATIENT DISPOSITION:  PACU - hemodynamically stable.   Delay start of Pharmacological VTE agent (>24hrs) due to surgical blood loss or risk of bleeding: yes

## 2015-10-18 NOTE — Anesthesia Postprocedure Evaluation (Signed)
Anesthesia Post Note  Patient: Ryan Lynn  Procedure(s) Performed: Procedure(s) (LRB): VOCAL CORD MEDIALIZATION (N/A) THYROPLASTY (N/A) ESOPHAGEAL DILATION (N/A)  Anesthesia type: General  Patient location: PACU  Post pain: Pain level controlled  Post assessment: Post-op Vital signs reviewed  Last Vitals: BP 168/98 mmHg  Pulse 86  Temp(Src) 36.9 C (Oral)  Resp 21  Ht 6' (1.829 m)  Wt 154 lb 2 oz (69.911 kg)  BMI 20.90 kg/m2  SpO2 99%  Post vital signs: Reviewed  Level of consciousness: sedated  Complications: No apparent anesthesia complications

## 2015-10-18 NOTE — Anesthesia Preprocedure Evaluation (Signed)
Anesthesia Evaluation  Patient identified by MRN, date of birth, ID band Patient awake    Reviewed: Allergy & Precautions, NPO status , Patient's Chart, lab work & pertinent test results  Airway Mallampati: II   Neck ROM: full    Dental   Pulmonary neg pulmonary ROS,    breath sounds clear to auscultation       Cardiovascular  Rhythm:regular Rate:Normal  Sporadic PVCs   Neuro/Psych    GI/Hepatic GERD  ,  Endo/Other    Renal/GU      Musculoskeletal   Abdominal   Peds  Hematology   Anesthesia Other Findings   Reproductive/Obstetrics                             Anesthesia Physical Anesthesia Plan  ASA: II  Anesthesia Plan: General   Post-op Pain Management:    Induction: Intravenous  Airway Management Planned: Oral ETT  Additional Equipment:   Intra-op Plan:   Post-operative Plan: Extubation in OR  Informed Consent: I have reviewed the patients History and Physical, chart, labs and discussed the procedure including the risks, benefits and alternatives for the proposed anesthesia with the patient or authorized representative who has indicated his/her understanding and acceptance.     Plan Discussed with: CRNA, Anesthesiologist and Surgeon  Anesthesia Plan Comments:         Anesthesia Quick Evaluation

## 2015-10-18 NOTE — Transfer of Care (Signed)
Immediate Anesthesia Transfer of Care Note  Patient: Ryan Lynn  Procedure(s) Performed: Procedure(s): VOCAL CORD MEDIALIZATION (N/A) THYROPLASTY (N/A) ESOPHAGEAL DILATION (N/A)  Patient Location: PACU  Anesthesia Type:General  Level of Consciousness: awake and patient cooperative  Airway & Oxygen Therapy: Patient Spontanous Breathing and Patient connected to face mask oxygen  Post-op Assessment: Report given to RN and Post -op Vital signs reviewed and stable  Post vital signs: Reviewed and stable  Last Vitals:  Filed Vitals:   10/18/15 0622  BP: 160/100  Pulse: 81  Temp: 36.6 C  Resp: 16    Complications: No apparent anesthesia complications

## 2015-10-18 NOTE — Discharge Instructions (Addendum)
Take your regular meds Start Keflex (antibiotic) today Take Tylenol, motrin or similar for pain.  Take the hydrocodone for more severe pain or discomfort Keep dressing dry for 24 hrs.  May then remove dressing and leave incision site open. Can apply antibiotic ointment to the incision site. Diet as tolerated Call Dr Pollie Friar office for follow up appt in 1 week  Next Tues or Wed    [phone]    Post Anesthesia Home Care Instructions  Activity: Get plenty of rest for the remainder of the day. A responsible adult should stay with you for 24 hours following the procedure.  For the next 24 hours, DO NOT: -Drive a car -Paediatric nurse -Drink alcoholic beverages -Take any medication unless instructed by your physician -Make any legal decisions or sign important papers.  Meals: Start with liquid foods such as gelatin or soup. Progress to regular foods as tolerated. Avoid greasy, spicy, heavy foods. If nausea and/or vomiting occur, drink only clear liquids until the nausea and/or vomiting subsides. Call your physician if vomiting continues.  Special Instructions/Symptoms: Your throat may feel dry or sore from the anesthesia or the breathing tube placed in your throat during surgery. If this causes discomfort, gargle with warm salt water. The discomfort should disappear within 24 hours.  If you had a scopolamine patch placed behind your ear for the management of post- operative nausea and/or vomiting:  1. The medication in the patch is effective for 72 hours, after which it should be removed.  Wrap patch in a tissue and discard in the trash. Wash hands thoroughly with soap and water. 2. You may remove the patch earlier than 72 hours if you experience unpleasant side effects which may include dry mouth, dizziness or visual disturbances. 3. Avoid touching the patch. Wash your hands with soap and water after contact with the patch.

## 2015-10-18 NOTE — Anesthesia Procedure Notes (Signed)
Procedure Name: Intubation Date/Time: 10/18/2015 10:11 AM Performed by: Marrianne Mood Pre-anesthesia Checklist: Patient identified, Emergency Drugs available, Suction available, Patient being monitored and Timeout performed Patient Re-evaluated:Patient Re-evaluated prior to inductionOxygen Delivery Method: Circle System Utilized Preoxygenation: Pre-oxygenation with 100% oxygen Intubation Type: IV induction Ventilation: Mask ventilation without difficulty Laryngoscope Size: Glidescope and 3 Tube type: Oral Tube size: 6.0 mm Number of attempts: 1 Airway Equipment and Method: Stylet and Oral airway Placement Confirmation: ETT inserted through vocal cords under direct vision,  positive ETCO2 and breath sounds checked- equal and bilateral Secured at: 24 cm Tube secured with: Tape Dental Injury: Teeth and Oropharynx as per pre-operative assessment

## 2015-10-19 ENCOUNTER — Encounter (HOSPITAL_BASED_OUTPATIENT_CLINIC_OR_DEPARTMENT_OTHER): Payer: Self-pay | Admitting: Otolaryngology

## 2015-10-19 NOTE — Op Note (Signed)
NAMEPARRIS, Ryan Lynn NO.:  [id number]  MEDICAL RECORD NO.:  [id number]  LOCATION:                               FACILITY:  [facility]  PHYSICIAN:  Leonides Sake. Lucia Gaskins, M.D.DATE OF BIRTH:  [birth year]  DATE OF PROCEDURE:  10/18/2015 DATE OF DISCHARGE:  10/18/2015                              OPERATIVE REPORT   PREOPERATIVE DIAGNOSES: 1. Hoarseness with dysphagia and aspiration. 2. Left vocal cord paralysis.  POSTOPERATIVE DIAGNOSES: 1. Hoarseness with dysphagia and aspiration. 2. Left vocal cord paralysis.  OPERATIONS: 1. Left vocal cord medialization thyroplasty. 2. Esophagoscopy with dilation with Savary dilators to 18 mm dilation.  SURGEON:  Leonides Sake. Lucia Gaskins, M.D.  ANESTHESIA:  MAC and general.  COMPLICATIONS:  None.  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  This was performed in 1988.  More recently, he has had some weakness of his voice and problems with repeated aspiration and aspiration pneumonia.  On exam in the office, he has a very weak, almost paralyzed left vocal cord.  He has recently undergone a barium swallow 3 or 4 months ago that demonstrated aspiration.  In order to help improve his voice as well as significantly to try to help improve aspiration, he was taken to the operating room at this time for a medialization of the left vocal cord and dilation of the upper cervical esophagus, although the barium swallow did not show any significant stricture.  DESCRIPTION OF PROCEDURE:  The patient was brought to the operating room, received some light IV sedation.  The neck was then prepped with Betadine solution.  The laryngeal cartilage was marked out and this area was injected with 8 mL of Xylocaine with epinephrine for local hemostasis.  A 4 to 5 cm horizontal incision was made over the left  side of the laryngeal cartilage.  Dissection was carried down through the subcutaneous tissue to the strap muscles.  The strap muscles were divided in midline, retracted laterally to expose the left laryngeal cartilage.  The mucoperiosteum was elevated off the left side of the laryngeal cartilage.  Initial attempts to make a cut with knife through the laryngeal cartilage were unsuccessful as the cartilage was mostly calcified.  A 2 mm cutting burr was then used to make a window into the cartilage, measuring approximately 6 x 10 to 12 mm in size.  The window was created approximately 10 to 12 mm posterior to the middle of the laryngeal cartilage and the lower 1/3rd to 1/2 of the laryngeal cartilage on the left side.  After making the window, the mucoperichondrium was elevated off the inner surface of the laryngeal cartilage.  Then, using a Freer, positioning of the window was checked under direct visualization using the fiberoptic laryngoscope.  Two pieces of silastic were cut to appropriate sizes and placed through the window to medialize the left vocal cord and to improve his voice.  After cutting the correct size and positioning the 2 pieces of silastic within the window, the procedure was completed.  Again, fiberoptic laryngoscopy  was performed and the vocal cord seemed to be fairly well medialized. This completed the procedure.  The strap muscles and the mucoperichondrium were sutured back to the midline with 3-0 chromic sutures.  Of note, the patient had a small lipoma just right of midline that was removed with a cautery.  The remaining subcutaneous tissue was closed with 3-0 chromic suture and 5-0 nylon sutures to reapproximate skin edges.  This completed the procedure.  Bacitracin ointment was applied over the incision site followed by sterile dressing.  Following the medialization thyroplasty procedure, direct laryngoscopy and cervical esophagoscopy were attempted.  Because of  the patient's rigid neck, was unable to pass the cervical esophagoscope through the upper esophageal sphincter because of the patient's limited extension of his neck as well as prominent teeth.  Direct laryngoscopy was performed.  On direct laryngoscopy, the base of tongue, vallecula, epiglottis, vocal cords appeared clear.  Piriform sinus was clear.  The guidewire to the Savary dilators was then passed through the upper cervical esophagus without any difficulty and subsequent dilation of the upper cervical esophagus was performed, starting with the #10 Savary dilators.  It was subsequently dilated up to 18 mm using the Savary dilators over the guidewire.  This completed the procedure.  Ryan Lynn was subsequently awakened from anesthesia and transferred to the recovery room, postop doing well.  DISPOSITION:  Ryan Lynn was discharged home later this morning on Keflex 500 mg b.i.d. for 1 week.  We will have him follow up in my office in 1 week for recheck and have sutures removed.          ______________________________ Leonides Sake Lucia Gaskins, M.D.     CEN/MEDQ  D:  10/18/2015  T:  10/18/2015  Job:  [id number]  cc:   Leonides Sake. Lucia Gaskins, M.D.'s Office Ryan Reel, MD

## 2017-03-26 ENCOUNTER — Other Ambulatory Visit: Payer: Self-pay | Admitting: Dentistry

## 2017-03-26 DIAGNOSIS — T66XXXA Radiation sickness, unspecified, initial encounter: Secondary | ICD-10-CM

## 2017-04-01 ENCOUNTER — Ambulatory Visit
Admission: RE | Admit: 2017-04-01 | Discharge: 2017-04-01 | Disposition: A | Discharge status: Home / Self Care | Payer: Medicare Other | Source: Ambulatory Visit | Attending: Dentistry | Admitting: Dentistry

## 2017-04-01 ENCOUNTER — Other Ambulatory Visit: Payer: Self-pay | Admitting: Dentistry

## 2017-04-01 DIAGNOSIS — T66XXXA Radiation sickness, unspecified, initial encounter: Secondary | ICD-10-CM

## 2017-04-01 MED ORDER — IOPAMIDOL (ISOVUE-300) INJECTION 61%
75.0000 mL | Freq: Once | INTRAVENOUS | Status: AC | PRN
  Administered 2017-04-01: 75 mL via INTRAVENOUS

## 2017-04-15 ENCOUNTER — Encounter (HOSPITAL_BASED_OUTPATIENT_CLINIC_OR_DEPARTMENT_OTHER): Payer: Medicare Other | Attending: Internal Medicine

## 2017-04-15 DIAGNOSIS — Y842 Radiological procedure and radiotherapy as the cause of abnormal reaction of the patient, or of later complication, without mention of misadventure at the time of the procedure: Secondary | ICD-10-CM | POA: Diagnosis not present

## 2017-04-15 DIAGNOSIS — Z85818 Personal history of malignant neoplasm of other sites of lip, oral cavity, and pharynx: Secondary | ICD-10-CM | POA: Diagnosis not present

## 2017-04-15 DIAGNOSIS — Z923 Personal history of irradiation: Secondary | ICD-10-CM | POA: Diagnosis not present

## 2017-04-15 DIAGNOSIS — L598 Other specified disorders of the skin and subcutaneous tissue related to radiation: Secondary | ICD-10-CM | POA: Diagnosis not present

## 2017-04-15 DIAGNOSIS — M272 Inflammatory conditions of jaws: Secondary | ICD-10-CM | POA: Insufficient documentation

## 2017-04-24 DIAGNOSIS — M272 Inflammatory conditions of jaws: Secondary | ICD-10-CM | POA: Diagnosis not present

## 2017-04-30 ENCOUNTER — Encounter (HOSPITAL_BASED_OUTPATIENT_CLINIC_OR_DEPARTMENT_OTHER): Payer: Medicare Other | Attending: Surgery

## 2017-04-30 DIAGNOSIS — M8668 Other chronic osteomyelitis, other site: Secondary | ICD-10-CM | POA: Insufficient documentation

## 2017-04-30 DIAGNOSIS — M272 Inflammatory conditions of jaws: Secondary | ICD-10-CM | POA: Insufficient documentation

## 2017-04-30 DIAGNOSIS — L598 Other specified disorders of the skin and subcutaneous tissue related to radiation: Secondary | ICD-10-CM | POA: Insufficient documentation

## 2017-04-30 DIAGNOSIS — Y842 Radiological procedure and radiotherapy as the cause of abnormal reaction of the patient, or of later complication, without mention of misadventure at the time of the procedure: Secondary | ICD-10-CM | POA: Insufficient documentation

## 2017-05-01 DIAGNOSIS — M8668 Other chronic osteomyelitis, other site: Secondary | ICD-10-CM | POA: Diagnosis not present

## 2017-05-01 DIAGNOSIS — Y842 Radiological procedure and radiotherapy as the cause of abnormal reaction of the patient, or of later complication, without mention of misadventure at the time of the procedure: Secondary | ICD-10-CM | POA: Diagnosis not present

## 2017-05-01 DIAGNOSIS — M272 Inflammatory conditions of jaws: Secondary | ICD-10-CM | POA: Diagnosis present

## 2017-05-01 DIAGNOSIS — L598 Other specified disorders of the skin and subcutaneous tissue related to radiation: Secondary | ICD-10-CM | POA: Diagnosis not present

## 2017-05-02 DIAGNOSIS — M272 Inflammatory conditions of jaws: Secondary | ICD-10-CM | POA: Diagnosis not present

## 2017-05-03 DIAGNOSIS — M272 Inflammatory conditions of jaws: Secondary | ICD-10-CM | POA: Diagnosis not present

## 2017-05-06 DIAGNOSIS — M272 Inflammatory conditions of jaws: Secondary | ICD-10-CM | POA: Diagnosis not present

## 2017-05-07 DIAGNOSIS — M272 Inflammatory conditions of jaws: Secondary | ICD-10-CM | POA: Diagnosis not present

## 2017-05-08 DIAGNOSIS — M272 Inflammatory conditions of jaws: Secondary | ICD-10-CM | POA: Diagnosis not present

## 2017-05-09 DIAGNOSIS — M272 Inflammatory conditions of jaws: Secondary | ICD-10-CM | POA: Diagnosis not present

## 2017-05-10 DIAGNOSIS — M272 Inflammatory conditions of jaws: Secondary | ICD-10-CM | POA: Diagnosis not present

## 2017-05-13 DIAGNOSIS — M272 Inflammatory conditions of jaws: Secondary | ICD-10-CM | POA: Diagnosis not present

## 2017-05-14 DIAGNOSIS — M272 Inflammatory conditions of jaws: Secondary | ICD-10-CM | POA: Diagnosis not present

## 2017-05-15 DIAGNOSIS — M272 Inflammatory conditions of jaws: Secondary | ICD-10-CM | POA: Diagnosis not present

## 2017-05-16 DIAGNOSIS — M272 Inflammatory conditions of jaws: Secondary | ICD-10-CM | POA: Diagnosis not present

## 2017-05-17 DIAGNOSIS — M272 Inflammatory conditions of jaws: Secondary | ICD-10-CM | POA: Diagnosis not present

## 2017-05-20 DIAGNOSIS — M272 Inflammatory conditions of jaws: Secondary | ICD-10-CM | POA: Diagnosis not present

## 2017-05-21 DIAGNOSIS — M272 Inflammatory conditions of jaws: Secondary | ICD-10-CM | POA: Diagnosis not present

## 2017-05-22 DIAGNOSIS — M272 Inflammatory conditions of jaws: Secondary | ICD-10-CM | POA: Diagnosis not present

## 2017-05-23 DIAGNOSIS — M272 Inflammatory conditions of jaws: Secondary | ICD-10-CM | POA: Diagnosis not present

## 2017-05-24 DIAGNOSIS — M272 Inflammatory conditions of jaws: Secondary | ICD-10-CM | POA: Diagnosis not present

## 2017-05-28 DIAGNOSIS — M272 Inflammatory conditions of jaws: Secondary | ICD-10-CM | POA: Diagnosis not present

## 2017-05-29 DIAGNOSIS — M272 Inflammatory conditions of jaws: Secondary | ICD-10-CM | POA: Diagnosis not present

## 2017-05-30 DIAGNOSIS — M272 Inflammatory conditions of jaws: Secondary | ICD-10-CM | POA: Diagnosis not present

## 2017-05-31 ENCOUNTER — Encounter (HOSPITAL_BASED_OUTPATIENT_CLINIC_OR_DEPARTMENT_OTHER): Payer: Medicare Other | Attending: Internal Medicine

## 2017-05-31 DIAGNOSIS — L598 Other specified disorders of the skin and subcutaneous tissue related to radiation: Secondary | ICD-10-CM | POA: Insufficient documentation

## 2017-05-31 DIAGNOSIS — M272 Inflammatory conditions of jaws: Secondary | ICD-10-CM | POA: Diagnosis present

## 2017-05-31 DIAGNOSIS — M8668 Other chronic osteomyelitis, other site: Secondary | ICD-10-CM | POA: Insufficient documentation

## 2017-06-10 DIAGNOSIS — M272 Inflammatory conditions of jaws: Secondary | ICD-10-CM | POA: Diagnosis not present

## 2017-06-11 DIAGNOSIS — M272 Inflammatory conditions of jaws: Secondary | ICD-10-CM | POA: Diagnosis not present

## 2017-06-12 DIAGNOSIS — M272 Inflammatory conditions of jaws: Secondary | ICD-10-CM | POA: Diagnosis not present

## 2017-06-13 DIAGNOSIS — M272 Inflammatory conditions of jaws: Secondary | ICD-10-CM | POA: Diagnosis not present

## 2017-06-14 DIAGNOSIS — M272 Inflammatory conditions of jaws: Secondary | ICD-10-CM | POA: Diagnosis not present

## 2017-06-17 DIAGNOSIS — M272 Inflammatory conditions of jaws: Secondary | ICD-10-CM | POA: Diagnosis not present

## 2017-06-18 DIAGNOSIS — M272 Inflammatory conditions of jaws: Secondary | ICD-10-CM | POA: Diagnosis not present

## 2019-07-28 ENCOUNTER — Encounter: Payer: Self-pay | Admitting: Internal Medicine

## 2019-09-24 ENCOUNTER — Other Ambulatory Visit (HOSPITAL_COMMUNITY): Payer: Self-pay | Admitting: *Deleted

## 2019-09-24 DIAGNOSIS — R1310 Dysphagia, unspecified: Secondary | ICD-10-CM

## 2019-09-25 ENCOUNTER — Other Ambulatory Visit: Payer: Self-pay

## 2019-09-25 ENCOUNTER — Ambulatory Visit (HOSPITAL_COMMUNITY)
Admission: RE | Admit: 2019-09-25 | Discharge: 2019-09-25 | Disposition: A | Discharge status: Home / Self Care | Payer: Medicare Other | Source: Ambulatory Visit | Attending: Otolaryngology | Admitting: Otolaryngology

## 2019-09-25 DIAGNOSIS — R1310 Dysphagia, unspecified: Secondary | ICD-10-CM | POA: Insufficient documentation

## 2019-09-25 NOTE — Progress Notes (Signed)
Objective Swallowing Evaluation: Type of Study: MBS-Modified Barium Swallow Study   Patient Details  Name: Ryan Lynn MRN: [id number] Date of Birth: 1934-12-12  Today's Date: 09/25/2019 Time: SLP Start Time (ACUTE ONLY): [id number] -SLP Stop Time (ACUTE ONLY): 1350  SLP Time Calculation (min) (ACUTE ONLY): 45 min   Past Medical History:  Past Medical History:  Diagnosis Date  . Aortic sclerosis ([place])   . Cancer of parotid gland ([place]) 1988  . Chest pressure   . Dysphagia   . ED (erectile dysfunction)   . Fatigue   . GERD (gastroesophageal reflux disease)   . Glaucoma, bilateral    "severe right; moderate left" (04/21/2015)  . Heart palpitations   . Pneumonia 1930's; 04/21/2015   hosp 04-25-15 asp pheumonia, c-diff  . PVC's (premature ventricular contractions)   . Rectal urgency    Past Surgical History:  Past Surgical History:  Procedure Laterality Date  . CARDIOVASCULAR STRESS TEST     NO ISCHEMIA  . ESOPHAGEAL DILATION N/A 10/18/2015   Procedure: ESOPHAGEAL DILATION;  Surgeon: Rozetta Nunnery, MD;  Location: [facility];  Service: ENT;  Laterality: N/A;  . GLAUCOMA SURGERY Bilateral "several"   "laser"  . INGUINAL HERNIA REPAIR Right 1999  . LARYNGOPLASTY N/A 10/18/2015   Procedure: VOCAL CORD MEDIALIZATION;  Surgeon: Rozetta Nunnery, MD;  Location: [facility];  Service: ENT;  Laterality: N/A;  . PAROTID GLAND TUMOR EXCISION  1988   radiation therapy  . THYROPLASTY N/A 10/18/2015   Procedure: THYROPLASTY;  Surgeon: Rozetta Nunnery, MD;  Location: [facility];  Service: ENT;  Laterality: N/A;  . TONSILLECTOMY  1943  . TRANSTHORACIC ECHOCARDIOGRAM  01/20/2010   NORMAL. EF 55-60%   HPI: Ryan Lynn is a 85 year old male who has had a remote history of a left parotid cancer status post parotidectomy and left neck dissection with radiation therapy.  This was performed in 1988.  Dr Donnie Coffin fro 2016 reports he has had some  weakness of his voice and problems with repeated aspiration and aspiration pneumonia, endoscopy shows a very weak almost paralyzed left vocal fold, underwent medialization and dilation of the upper cervical esophagus. Last MBS in 2016 shoed aspiration of nectar but not thin, residue, chin tuck and regular diet with thin liquids recommended. Pt reports that his swallowing has become progressively worse, to the point wehre he has significant difficulty swallowing solids even after extensive chewing, he has at time nasal regurgitation of solids after taking a sip of liquids, his voice has gotten weaker and he also worries about his nturition. He feels liek getting enough nutiriton is a struggle as it takes him excessive time to eat a meal and he gets very little pleasure from it. 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    No data recorded   Assessment / Plan / Recommendation  CHL IP CLINICAL IMPRESSIONS 09/25/2019  Clinical Impression Pt demonstrates with an oropharyngeal dysphagia increased in severity since his last MBS in 2016. Pts primary problems are weakness/reduced ROM of the base of tongue and pharyngeal constrictors leading to severe pharyngeal residue. This combined with known left vocal fold paresis and nearly absent laryngeal sensation lead to significant silent aspiration events with thin liquids during and after the  swallow. Pt has already transitioned to nectar thick liquids, and these are tolerated much better, though mild silent aspiration events after the swallow continue to occur. Several strategies were trialed. Pt uses a chin tuck, but not very deeply, it is minimally effective. A head turn left seems to reduce aspiration of liquids and may slightly address residue. Pt suggested a  turn/tilt to the left post study that he plans to try at home. The best strategy was an effortful, prolonged swallow, which significantly increased upper pharyngeal constriction and persitalsis, followed by a throat clear and multiple further swallows. Following bites of moist creamy solids with sips of nectar thick liquids is the recommend texture combination. However, pt is at increasingly high risk of aspiration at he pushes himself to consume adequate calories. He reports no pleasure from eating. Pt may need to discuss the best ways to supplement his oral intake and reduce aspiration risk. He would definitely benefit from visiting OP SLP therapy to address post radiation muscle wasting as well as compensatory strategies for voice (would a voice amplification device be helpful?)    SLP Visit Diagnosis Dysphagia, oropharyngeal phase (R13.12)  Attention and concentration deficit following --  Frontal lobe and executive function deficit following --  Impact on safety and function Severe aspiration risk;Risk for inadequate nutrition/hydration      CHL IP TREATMENT RECOMMENDATION 04/27/2015  Treatment Recommendations Therapy as outlined in treatment plan below     Prognosis 04/27/2015  Prognosis for Safe Diet Advancement Fair  Barriers to Reach Goals Severity of dysphagia;Time post onset  Barriers/Prognosis Comment --    CHL IP DIET RECOMMENDATION 09/25/2019  SLP Diet Recommendations Dysphagia 1 (Puree) solids;Nectar thick liquid  Liquid Administration via Straw  Medication Administration Crushed with puree  Compensations Slow rate;Small sips/bites;Clear throat after each swallow;Multiple dry swallows after each bite/sip;Follow solids with liquid;Effortful swallow  Postural Changes Seated upright at 90 degrees;Remain semi-upright after after feeds/meals (Comment)      CHL IP OTHER RECOMMENDATIONS 09/25/2019  Recommended Consults --  Oral Care Recommendations Oral care QID  Other Recommendations  --      CHL IP FOLLOW UP RECOMMENDATIONS 04/27/2015  Follow up Recommendations Home health SLP      CHL IP FREQUENCY AND DURATION 04/27/2015  Speech Therapy Frequency (ACUTE ONLY) min 2x/week  Treatment Duration 1 week           CHL IP ORAL PHASE 09/25/2019  Oral Phase Impaired  Oral - Pudding Teaspoon --  Oral - Pudding Cup --  Oral - Honey Teaspoon --  Oral - Honey Cup --  Oral - Nectar Teaspoon --  Oral - Nectar Cup --  Oral - Nectar Straw (No Data)  Oral - Thin Teaspoon --  Oral - Thin Cup (No Data)  Oral - Thin Straw (No Data)  Oral - Puree WFL  Oral - Mech Soft --  Oral - Regular --  Oral - Multi-Consistency --  Oral - Pill --  Oral Phase - Comment --    CHL IP PHARYNGEAL PHASE 09/25/2019  Pharyngeal Phase Impaired  Pharyngeal- Pudding Teaspoon --  Pharyngeal --  Pharyngeal- Pudding Cup --  Pharyngeal --  Pharyngeal- Honey Teaspoon --  Pharyngeal --  Pharyngeal- Honey Cup --  Pharyngeal --  Pharyngeal- Nectar Teaspoon --  Pharyngeal --  Pharyngeal- Nectar Cup --  Pharyngeal --  Pharyngeal- Nectar Straw Reduced epiglottic inversion;Reduced tongue base retraction;Reduced pharyngeal peristalsis;Trace aspiration;Penetration/Apiration after swallow;Pharyngeal residue - valleculae;Pharyngeal residue - pyriform  Pharyngeal Material enters airway, passes  BELOW cords without attempt by patient to eject out (silent aspiration)  Pharyngeal- Thin Teaspoon --  Pharyngeal --  Pharyngeal- Thin Cup Reduced epiglottic inversion;Reduced tongue base retraction;Reduced pharyngeal peristalsis;Trace aspiration;Pharyngeal residue - valleculae;Pharyngeal residue - pyriform;Penetration/Aspiration during swallow;Penetration/Apiration after swallow  Pharyngeal Material enters airway, passes BELOW cords without attempt by patient to eject out (silent aspiration)  Pharyngeal- Thin Straw Reduced epiglottic inversion;Reduced tongue base retraction;Reduced pharyngeal peristalsis;Trace  aspiration;Penetration/Apiration after swallow;Pharyngeal residue - valleculae;Pharyngeal residue - pyriform;Penetration/Aspiration during swallow  Pharyngeal Material enters airway, passes BELOW cords without attempt by patient to eject out (silent aspiration)  Pharyngeal- Puree Reduced epiglottic inversion;Reduced tongue base retraction;Reduced pharyngeal peristalsis;Pharyngeal residue - valleculae;Pharyngeal residue - pyriform  Pharyngeal --  Pharyngeal- Mechanical Soft --  Pharyngeal --  Pharyngeal- Regular --  Pharyngeal --  Pharyngeal- Multi-consistency --  Pharyngeal --  Pharyngeal- Pill --  Pharyngeal --  Pharyngeal Comment --     No flowsheet data found.  [name], MA CCC-SLP  Acute Rehabilitation Services Pager [phone] Office [phone]  Lynann Beaver 09/25/2019, 2:37 PM

## 2019-10-07 ENCOUNTER — Ambulatory Visit: Payer: Medicare Other | Attending: Otolaryngology

## 2019-10-07 ENCOUNTER — Other Ambulatory Visit: Payer: Self-pay

## 2019-10-07 DIAGNOSIS — R1312 Dysphagia, oropharyngeal phase: Secondary | ICD-10-CM | POA: Insufficient documentation

## 2019-10-07 DIAGNOSIS — R490 Dysphonia: Secondary | ICD-10-CM

## 2019-10-07 NOTE — Patient Instructions (Addendum)
  Refer to the handout Ryan Lynn gave to you about what you are supposed to do when you eat.  Muscle fibrosis, atrophy, and deconditioning of the swallowing muscles all likely have contributed to your decline in swallowing function.    Do 10 of the tongue out swallows x3/day   We will give you more exercises next time, and will give you the voice exercises too.

## 2019-10-08 NOTE — Therapy (Signed)
[facility] [address] [place], [place], [address] Phone: [phone]   Fax:  [phone]  Speech Language Pathology Evaluation  Ryan Lynn Details  Name: Ryan Lynn MRN: [id number] Date of Birth: 11-25-1934 Referring Provider (SLP): [name], MD   Encounter Date: 10/07/2019  End of Session - 10/08/19 0915    Visit Number  1    Number of Visits  17    Date for SLP Re-Evaluation  01/05/20    SLP Start Time  [id number]    SLP Stop Time   1616    SLP Time Calculation (min)  43 min    Activity Tolerance  Ryan Lynn tolerated treatment well       Past Medical History:  Diagnosis Date  . Aortic sclerosis ([place])   . 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 Chest pressure   . Dysphagia   . ED (erectile dysfunction)   . Fatigue   . GERD (gastroesophageal reflux disease)   . Glaucoma, bilateral    "severe right; moderate left" (04/21/2015)  . Heart palpitations   . Pneumonia 1930's; 04/21/2015   hosp 04-25-15 asp pheumonia, c-diff  . PVC's (premature ventricular contractions)   . Rectal urgency     Past Surgical History:  Procedure Laterality Date  . CARDIOVASCULAR STRESS TEST     NO ISCHEMIA  . ESOPHAGEAL DILATION N/A 10/18/2015   Procedure: ESOPHAGEAL DILATION;  Surgeon: Rozetta Nunnery, MD;  Location: [facility];  Service: ENT;  Laterality: N/A;  . GLAUCOMA SURGERY Bilateral "several"   "laser"  . INGUINAL HERNIA REPAIR Right 1999  . LARYNGOPLASTY N/A 10/18/2015   Procedure: VOCAL CORD MEDIALIZATION;  Surgeon: Rozetta Nunnery, MD;  Location: [facility];  Service: ENT;  Laterality: N/A;  . PAROTID GLAND TUMOR EXCISION  1988   radiation therapy  . THYROPLASTY N/A 10/18/2015   Procedure: THYROPLASTY;  Surgeon: Rozetta Nunnery, MD;  Location: [facility];  Service: ENT;  Laterality: N/A;  . TONSILLECTOMY  1943  . TRANSTHORACIC ECHOCARDIOGRAM  01/20/2010    NORMAL. EF 55-60%    There were no vitals filed for this visit.  Subjective Assessment - 10/07/19 1543    Subjective  "I love eggs and sausage and the eggs go down fair, but I had to give the sausage up." Ryan Lynn has recently gone from one Boost to two Boost.    Currently in Pain?  No/denies         SLP Evaluation [facility] - 10/08/19 0001      SLP Visit Information   SLP Received On  10/07/19    Referring Provider (SLP)  [name], MD    Onset Date  10 years hx    Medical Diagnosis  Pharyngeal dysphagia, lt vocal fold paresis      Subjective   Ryan Lynn/Family Stated Goal  Improved swallow function      Pain Assessment   Currently in Pain?  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  This was performed in 1988.  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 Last MBS in 2016  shoed aspiration of nectar but not thin, residue, chin tuck and regular diet with thin liquids recommended. Ryan Lynn reports that his swallowing has become progressively worse, to the point wehre he has significant difficulty swallowing solids even after extensive chewing, he has at time nasal regurgitation of solids after taking a sip of liquids, his voice has gotten weaker and he also worries about his nturition. He feels liek getting enough nutiriton is a struggle as it takes him excessive time to eat a meal and he gets very little pleasure from it. 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       Prior Functional Status   Cognitive/Linguistic Baseline  Within functional limits      Oral Motor/Sensory Function   Overall Oral Motor/Sensory Function  --   deferred due to clinic masking policy/COVID-19 exposure     Motor Speech   Phonation  Hoarse;Low vocal intensity   severely hoarse/high-pitched;Ryan Lynn may benefit from [facility](?)     SLP observed Ryan Lynn with applesauce and nectar thick water. He did not follow swallow precautions until SLP reminded Ryan Lynn of them, afterwhich he req'd fading cues from occasional min-mod to independent with their use. No overt s/sx aspiration noted however SLP knows Ryan Lynn is silent aspirator. SLP to provide Ryan Lynn a full set of swallow and voice exercises (PHortE) next 1-2 sessions.                SLP Education - 10/08/19 0914    Education Details  Swallow precautions, Masako, therapy goals, late effects head/neck radiation on swallowing ability    Person(s) Educated  Ryan Lynn    Methods  Explanation;Demonstration;Verbal cues;Handout    Comprehension  Verbalized understanding;Returned demonstration;Verbal cues required;Need further instruction       SLP Short Term Goals - 10/08/19 [id number]      SLP SHORT TERM GOAL #1   Title  Ryan Lynn will perform swallow HEP with rare min A over three sessions    Time  4    Period  Weeks    Status  New      SLP SHORT TERM GOAL #2   Title  Ryan Lynn will perform PHortE exercises with occasional min A over three sessions    Time  4    Period  Weeks    Status  New      SLP SHORT TERM GOAL #3   Title  Ryan Lynn will demo swallow precautions from MBSS 09-25-19 with rare min A over two sessions    Time  4    Period  Weeks    Status  New       SLP Long Term Goals - 10/08/19 [id number]      SLP LONG TERM GOAL #1   Title  Ryan Lynn will demo swallow HEP with modified independence over three sessions    Time  8    Period  Weeks   or 17 sessions, for all LTGS   Status  New      SLP LONG TERM  GOAL #2   Title  Ryan Lynn will demo PHortE exercises with modified independence over three sessions    Time  8    Period  Weeks    Status  New      SLP LONG TERM GOAL #3  Title  Ryan Lynn will show SLP mastery of swallow precautions with POs over 3 sessions    Time  8    Period  Weeks    Status  New       Plan - 10/08/19 0916    Clinical Impression Statement  Per MBSS dated 09-24-19, Ryan Lynn presents with a oropharyngeal dysphagia increased in severity since last MBSS in 2016. Weakness and reduced ROM in base of tongue and pharyngeal constrictors lead to severe pharyngeal residues. Ryan Lynn SILENTLY aspirated with thin and mild aspiration with nectar. Head turn left and a prolonged effortful swallow with throat clear and multiple swallows were most effective. SLP reiterated these for Ryan Lynn today as he did NOT use them spontaneously with POs until SLP re-educated Ryan Lynn about them. He has increased liquie oral supplements from one Boost to two/day. SLP demonstrated Masako for Ryan Lynn today and was told to complete 10 reps x3/day. SLP to provide more exercises next visit. Ryan Lynn's voice quality likely with some muscle atrophy and/or fibrosis - SLP to prescribe PHortE exercises to give Ryan Lynn the chance to bulk up vocal fold muculature in order to hopefully improve vocal quality. (Ryan Lynn has undergone vocal fold medialization sx in the past). He would benefit from skilled ST focusing on improving both swallow and voice function to improve QOL - Ryan Lynn states eating is not pleasurable for him.    Speech Therapy Frequency  2x / week    Duration  --   8 weeks or 17 total visits   Treatment/Interventions  Aspiration precaution training;Pharyngeal strengthening exercises;Diet toleration management by SLP;Trials of upgraded texture/liquids;Cueing hierarchy;Ryan Lynn/family education;SLP instruction and feedback   PHortE exercises   Potential to Achieve Goals  Fair    Potential Considerations  Previous level of function;Severity of impairments    SLP  Home Exercise Plan  provided today    Consulted and Agree with Plan of Care  Ryan Lynn       Ryan Lynn will benefit from skilled therapeutic intervention in order to improve the following deficits and impairments:   Oropharyngeal dysphagia  Voice hoarseness    Problem List Ryan Lynn Active Problem List   Diagnosis Date Noted  . Aspiration pneumonia ([place]) 04/25/2015  . Malnutrition of moderate degree ([place]) 04/22/2015  . Lung mass   . Pulmonary abscess ([place])   . HCAP (healthcare-associated pneumonia)   . Pneumonia 04/21/2015  . Obstructive pneumonia 04/21/2015  . Lung abscess ([place]) 04/21/2015  . History of Clostridium difficile infection 04/21/2015  . Hoarseness of voice 10/15/2013  . BPH (benign prostatic hyperplasia) 10/15/2013  . Xerostomia 07/23/2012  . Fatigue 12/26/2011  . PVC's (premature ventricular contractions) 06/25/2011  . Benign hypertensive heart disease without heart failure 06/25/2011  . HEMORRHOIDS-EXTERNAL 09/26/2009  . FECAL INCONTINENCE 09/26/2009  . PERSONAL HX COLONIC POLYPS 09/26/2009  . GERD 07/14/2008  . Dysphagia, pharyngoesophageal phase 07/14/2008  . DIARRHEA 07/14/2008  . COLONIC POLYPS 07/13/2008  . HEMORRHOIDS 07/13/2008  . DIVERTICULOSIS, COLON 07/13/2008    [facility] ,MS, CCC-SLP  10/08/2019, 9:30 AM  [facility] [address] [place], [place], [address] Phone: [phone]   Fax:  [phone]  Name: Ryan Lynn MRN: [id number] Date of Birth: 10-18-1934

## 2019-10-13 ENCOUNTER — Other Ambulatory Visit: Payer: Self-pay

## 2019-10-13 ENCOUNTER — Ambulatory Visit: Payer: Medicare Other

## 2019-10-13 DIAGNOSIS — R490 Dysphonia: Secondary | ICD-10-CM

## 2019-10-13 DIAGNOSIS — R1312 Dysphagia, oropharyngeal phase: Secondary | ICD-10-CM | POA: Diagnosis not present

## 2019-10-13 NOTE — Therapy (Signed)
[facility] [address] [place], [place], [address] Phone: [phone]   Fax:  [phone]  Speech Language Pathology Treatment  Patient Details  Name: Ryan Lynn MRN: [id number] Date of Birth: Sep 02, 1934 Referring Provider (SLP): [name], MD   Encounter Date: 10/13/2019  End of Session - 10/13/19 1652    Visit Number  2    Number of Visits  17    Date for SLP Re-Evaluation  01/05/20    SLP Start Time  1450    SLP Stop Time   [id number]    SLP Time Calculation (min)  40 min    Activity Tolerance  Patient tolerated treatment well       Past Medical History:  Diagnosis Date  . Aortic sclerosis   . 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 Chest pressure   . Dysphagia   . ED (erectile dysfunction)   . Fatigue   . GERD (gastroesophageal reflux disease)   . Glaucoma, bilateral    "severe right; moderate left" (04/21/2015)  . Heart palpitations   . Pneumonia 1930's; 04/21/2015   hosp 04-25-15 asp pheumonia, c-diff  . PVC's (premature ventricular contractions)   . Rectal urgency     Past Surgical History:  Procedure Laterality Date  . CARDIOVASCULAR STRESS TEST     NO ISCHEMIA  . ESOPHAGEAL DILATION N/A 10/18/2015   Procedure: ESOPHAGEAL DILATION;  Surgeon: Rozetta Nunnery, MD;  Location: [facility];  Service: ENT;  Laterality: N/A;  . GLAUCOMA SURGERY Bilateral "several"   "laser"  . INGUINAL HERNIA REPAIR Right 1999  . LARYNGOPLASTY N/A 10/18/2015   Procedure: VOCAL CORD MEDIALIZATION;  Surgeon: Rozetta Nunnery, MD;  Location: [facility];  Service: ENT;  Laterality: N/A;  . PAROTID GLAND TUMOR EXCISION  1988   radiation therapy  . THYROPLASTY N/A 10/18/2015   Procedure: THYROPLASTY;  Surgeon: Rozetta Nunnery, MD;  Location: [facility];  Service: ENT;  Laterality: N/A;  . TONSILLECTOMY  1943  . TRANSTHORACIC ECHOCARDIOGRAM  01/20/2010   NORMAL. EF 55-60%    There were no vitals filed for this visit.  Subjective Assessment - 10/13/19 1452    Subjective  "(the Masako) is going pretty good."    Currently in Pain?  No/denies            ADULT SLP TREATMENT - 10/13/19 1452      General Information   Behavior/Cognition  Alert;Cooperative;Pleasant mood      Treatment Provided   Treatment provided  Dysphagia      Dysphagia Treatment   Other treatment/comments  Pt reports bolus, when using head tilt/turn to lt does not "stay under control" after 4-5 swallows. He reports that deep tuck straight on does help. SLP encouraged pt to cont to utillize what assists him best. SLP taght pt the remainder of the HEP fro swallowing, with total A faded to rare min A. SLP to cont to review with pt, and will teach pt PHorTE next session.      Assessment / Recommendations / Plan   Plan  Continue with current plan of care      Progression Toward Goals   Progression toward goals  Progressing toward goals       SLP Education - 10/13/19 1651    Education Details  HEP for swallowing, deep chin down posture OK    Person(s) Educated  Patient    Methods  Explanation;Demonstration;Handout;Verbal cues  Comprehension  Returned demonstration;Verbalized understanding;Verbal cues required;Need further instruction       SLP Short Term Goals - 10/13/19 1654      SLP SHORT TERM GOAL #1   Title  pt will perform swallow HEP with rare min A over three sessions    Time  4    Period  Weeks    Status  On-going      SLP SHORT TERM GOAL #2   Title  pt will perform PHortE exercises with occasional min A over three sessions    Time  4    Period  Weeks    Status  On-going      SLP SHORT TERM GOAL #3   Title  pt will demo swallow precautions from MBSS 09-25-19 with rare min A over two sessions    Time  4    Period  Weeks    Status  On-going       SLP Long Term Goals - 10/13/19 1654      SLP LONG TERM GOAL #1   Title  pt will demo  swallow HEP with modified independence over three sessions    Time  8    Period  Weeks   or 17 sessions, for all LTGS   Status  On-going      SLP LONG TERM GOAL #2   Title  pt will demo PHortE exercises with modified independence over three sessions    Time  8    Period  Weeks    Status  On-going      SLP LONG TERM GOAL #3   Title  pt will show SLP mastery of swallow precautions with POs over 3 sessions    Time  8    Period  Weeks    Status  On-going      SLP LONG TERM GOAL #4   Title  pt will report (subjetively) a higher degree of pleasure when eating than prior to ST    Time  [place] - 10/13/19 1652    Clinical Impression Statement  Per MBSS dated 09-24-19, Ryan Lynn presents with an oropharyngeal dysphagia. Weakness and reduced ROM in base of tongue and pharyngeal constrictors lead to severe pharyngeal residues. Pt SILENTLY aspirated with thin and mild aspiration with nectar. Head turn left and a prolonged effortful swallow with throat clear and multiple swallows were most effective. SLP demonstrated HEP for swallowing for pt today and was told to complete 10 reps of each exercise x3/day. Pt's voice quality likely with some muscle atrophy and/or fibrosis - SLP to prescribe PHortE exercises to give pt the chance to bulk up vocal fold muculature in order to hopefully improve vocal quality. (Pt has undergone vocal fold medialization sx in the past). He would benefit from skilled ST focusing on improving both swallow and voice function to improve QOL - pt states eating is not pleasurable for him.    Speech Therapy Frequency  2x / week    Duration  --   8 weeks or 17 total visits   Treatment/Interventions  Aspiration precaution training;Pharyngeal strengthening exercises;Diet toleration management by SLP;Trials of upgraded texture/liquids;Cueing hierarchy;Patient/family education;SLP instruction and feedback   PHortE exercises   Potential to  Achieve Goals  Fair    Potential Considerations  Previous level of function;Severity of impairments    SLP Home Exercise Plan  provided today    Consulted  and Agree with Plan of Care  Patient       Patient will benefit from skilled therapeutic intervention in order to improve the following deficits and impairments:   Oropharyngeal dysphagia  Voice hoarseness    Problem List Patient Active Problem List   Diagnosis Date Noted  . Aspiration pneumonia ([place]) 04/25/2015  . Malnutrition of moderate degree ([place]) 04/22/2015  . Lung mass   . Pulmonary abscess ([place])   . HCAP (healthcare-associated pneumonia)   . Pneumonia 04/21/2015  . Obstructive pneumonia 04/21/2015  . Lung abscess ([place]) 04/21/2015  . History of Clostridium difficile infection 04/21/2015  . Hoarseness of voice 10/15/2013  . BPH (benign prostatic hyperplasia) 10/15/2013  . Xerostomia 07/23/2012  . Fatigue 12/26/2011  . PVC's (premature ventricular contractions) 06/25/2011  . Benign hypertensive heart disease without heart failure 06/25/2011  . HEMORRHOIDS-EXTERNAL 09/26/2009  . FECAL INCONTINENCE 09/26/2009  . PERSONAL HX COLONIC POLYPS 09/26/2009  . GERD 07/14/2008  . Dysphagia, pharyngoesophageal phase 07/14/2008  . DIARRHEA 07/14/2008  . COLONIC POLYPS 07/13/2008  . HEMORRHOIDS 07/13/2008  . DIVERTICULOSIS, COLON 07/13/2008    [facility] ,MS, CCC-SLP  10/13/2019, 4:56 PM  [facility] [address] [place], [place], [address] Phone: [phone]   Fax:  [phone]   Name: Ryan Lynn MRN: [id number] Date of Birth: Apr 27, 1934

## 2019-10-13 NOTE — Patient Instructions (Signed)
  Masako swallow Stick out your tongue past your lips and hold it with your teeth Swallow!   10 times, 3 times a day  Effortful swallow Swallow like you are swallowing a golf ball  10 times, 3 times a day  Gargle Pretend like you are gargling  10 times, 3 times a day  Super Swallow Take a breath and hold it Bear down Swallow and cough immediately  10 times, 3 times a day  Tongue pullback Take the gauze and pull your tongue gently forward Pull your tongue gently forward for 1 second  10 times 3 times a day  "AH" slide Say "ah" and slide up to as HIGH of a pitch as you can 10 times, 3 times a day  [employer] hard and hold the swallow for 5 seconds!  10 times, 3 times a day  [employer] your mouth as WIDE open as you can for 5 seconds 10 times 3 times a day

## 2019-10-16 ENCOUNTER — Ambulatory Visit: Payer: Medicare Other

## 2019-10-16 ENCOUNTER — Other Ambulatory Visit: Payer: Self-pay

## 2019-10-16 DIAGNOSIS — R1312 Dysphagia, oropharyngeal phase: Secondary | ICD-10-CM

## 2019-10-16 DIAGNOSIS — R490 Dysphonia: Secondary | ICD-10-CM

## 2019-10-16 NOTE — Patient Instructions (Addendum)
    Practice 10 abdominal breaths before you practice PHoRTE exercises  Sing using a straw for 2 minutes before before you practice PHoRTE exercises   Success  1) Hold "[place]" 10 times   As long as you can as strong as you can - push from your abdominal muscles!   http://mullen.[email or web address]  2) Count 1-10 (skip 7)   Start at as low pitch as you can, glide up to a high pitch, then back down to a low pitch   https://waller.org/  3) Say 10 sentences -two different ways   (a) like you are calling over the fence to your neighbor in a higher-pitch voice  (b) like you are scolding your dog in a LOW authoritative voice   [email or web address]

## 2019-10-16 NOTE — Therapy (Signed)
[facility] [address] [place], [place], [address] Phone: [phone]   Fax:  [phone]  Speech Language Pathology Treatment  Patient Details  Name: Ryan Lynn MRN: [id number] Date of Birth: Mar 08, 1934 Referring Provider (SLP): Ryan Overly, MD   Encounter Date: 10/16/2019  End of Session - 10/16/19 1353    Visit Number  3    Number of Visits  17    Date for SLP Re-Evaluation  01/05/20    SLP Start Time  5    SLP Stop Time   1400    SLP Time Calculation (min)  42 min    Activity Tolerance  Patient tolerated treatment well       Past Medical History:  Diagnosis Date  . Aortic sclerosis   . 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 Chest pressure   . Dysphagia   . ED (erectile dysfunction)   . Fatigue   . GERD (gastroesophageal reflux disease)   . Glaucoma, bilateral    "severe right; moderate left" (04/21/2015)  . Heart palpitations   . Pneumonia 1930's; 04/21/2015   hosp 04-25-15 asp pheumonia, c-diff  . PVC's (premature ventricular contractions)   . Rectal urgency     Past Surgical History:  Procedure Laterality Date  . CARDIOVASCULAR STRESS TEST     NO ISCHEMIA  . ESOPHAGEAL DILATION N/A 10/18/2015   Procedure: ESOPHAGEAL DILATION;  Surgeon: Rozetta Nunnery, MD;  Location: [facility];  Service: ENT;  Laterality: N/A;  . GLAUCOMA SURGERY Bilateral "several"   "laser"  . INGUINAL HERNIA REPAIR Right 1999  . LARYNGOPLASTY N/A 10/18/2015   Procedure: VOCAL CORD MEDIALIZATION;  Surgeon: Rozetta Nunnery, MD;  Location: [facility];  Service: ENT;  Laterality: N/A;  . PAROTID GLAND TUMOR EXCISION  1988   radiation therapy  . THYROPLASTY N/A 10/18/2015   Procedure: THYROPLASTY;  Surgeon: Rozetta Nunnery, MD;  Location: [facility];  Service: ENT;  Laterality: N/A;  . TONSILLECTOMY  1943  . TRANSTHORACIC ECHOCARDIOGRAM  01/20/2010   NORMAL. EF 55-60%    There were no vitals filed for this visit.  Subjective Assessment - 10/16/19 1322    Subjective  "I'm singing soprano more than I'm singing bass."    Currently in Pain?  No/denies            ADULT SLP TREATMENT - 10/16/19 1323      General Information   Behavior/Cognition  Alert;Cooperative;Pleasant mood      Treatment Provided   Treatment provided  Cognitive-Linquistic      Cognitive-Linquistic Treatment   Treatment focused on  Voice    Skilled Treatment  SLP taught pt PHorTE exercises, abdominal breathing. Consistent cues for using high and low pitches as pt's pitch range is severely limited.  SLP also reviewed Mendelsohn with fading cues.       Assessment / Recommendations / Plan   Plan  Continue with current plan of care      Progression Toward Goals   Progression toward goals  Progressing toward goals       SLP Education - 10/16/19 1348    Education Details  MEndelsohn, PHorTE, abdominal breathing    Person(s) Educated  Patient    Methods  Explanation;Demonstration;Verbal cues   video YouTube   Comprehension  Verbalized understanding       SLP Short Term Goals - 10/16/19 1432      SLP SHORT TERM GOAL #1  Title  pt will perform swallow HEP with rare min A over three sessions    Time  4    Period  Weeks    Status  On-going      SLP SHORT TERM GOAL #2   Title  pt will perform PHortE exercises with occasional min A over three sessions    Time  4    Period  Weeks    Status  On-going      SLP SHORT TERM GOAL #3   Title  pt will demo swallow precautions from [facility] 09-25-19 with rare min A over two sessions    Time  4    Period  Weeks    Status  On-going       SLP Long Term Goals - 10/16/19 1432      SLP LONG TERM GOAL #1   Title  pt will demo swallow HEP with modified independence over three sessions    Time  8    Period  Weeks   or 17 sessions, for all LTGS   Status  On-going      SLP LONG TERM GOAL #2   Title  pt will  demo PHortE exercises with modified independence over three sessions    Time  8    Period  Weeks    Status  On-going      SLP LONG TERM GOAL #3   Title  pt will show SLP mastery of swallow precautions with POs over 3 sessions    Time  8    Period  Weeks    Status  On-going      SLP LONG TERM GOAL #4   Title  pt will report (subjetively) a higher degree of pleasure when eating than prior to ST    Time  8    Period  Weeks    Status  On-going       Plan - 10/16/19 1431    Clinical Impression Statement  Per MBSS dated 09-24-19, [name] presents with an oropharyngeal dysphagia. Weakness and reduced ROM in base of tongue and pharyngeal constrictors lead to severe pharyngeal residues. Pt's voice quality likely with some muscle atrophy and/or fibrosis - SLP taught pt PHortE exercises to give pt the chance to bulk up vocal fold muculature in order to hopefully improve vocal quality. (Pt has undergone vocal fold medialization sx in the past). He would benefit from skilled ST focusing on improving both swallow and voice function to improve QOL - pt states eating is not pleasurable for him.    Speech Therapy Frequency  2x / week    Duration  --   8 weeks or 17 total visits   Treatment/Interventions  Aspiration precaution training;Pharyngeal strengthening exercises;Diet toleration management by SLP;Trials of upgraded texture/liquids;Cueing hierarchy;Patient/family education;SLP instruction and feedback   PHortE exercises   Potential to Achieve Goals  Fair    Potential Considerations  Previous level of function;Severity of impairments    SLP Home Exercise Plan  provided today    Consulted and Agree with Plan of Care  Patient       Patient will benefit from skilled therapeutic intervention in order to improve the following deficits and impairments:   Oropharyngeal dysphagia  Voice hoarseness    Problem List Patient Active Problem List   Diagnosis Date Noted  . Aspiration pneumonia ([place])  04/25/2015  . Malnutrition of moderate degree ([place]) 04/22/2015  . Lung mass   . Pulmonary abscess ([place])   . HCAP (healthcare-associated  pneumonia)   . Pneumonia 04/21/2015  . Obstructive pneumonia 04/21/2015  . Lung abscess ([facility]) 04/21/2015  . History of Clostridium difficile infection 04/21/2015  . Hoarseness of voice 10/15/2013  . BPH (benign prostatic hyperplasia) 10/15/2013  . Xerostomia 07/23/2012  . Fatigue 12/26/2011  . PVC's (premature ventricular contractions) 06/25/2011  . Benign hypertensive heart disease without heart failure 06/25/2011  . HEMORRHOIDS-EXTERNAL 09/26/2009  . FECAL INCONTINENCE 09/26/2009  . PERSONAL HX COLONIC POLYPS 09/26/2009  . GERD 07/14/2008  . Dysphagia, pharyngoesophageal phase 07/14/2008  . DIARRHEA 07/14/2008  . COLONIC POLYPS 07/13/2008  . HEMORRHOIDS 07/13/2008  . DIVERTICULOSIS, COLON 07/13/2008    [facility] ,MS, CCC-SLP  10/16/2019, 2:32 PM  [facility] [address] [place], [place], [address] Phone: [phone]   Fax:  [phone]   Name: Ryan Lynn MRN: [id number] Date of Birth: 01-08-1934

## 2019-10-19 ENCOUNTER — Other Ambulatory Visit: Payer: Self-pay

## 2019-10-19 ENCOUNTER — Ambulatory Visit: Payer: Medicare Other

## 2019-10-19 DIAGNOSIS — R1312 Dysphagia, oropharyngeal phase: Secondary | ICD-10-CM

## 2019-10-19 DIAGNOSIS — R490 Dysphonia: Secondary | ICD-10-CM

## 2019-10-19 NOTE — Therapy (Signed)
[facility] [address] [place], [place], [address] Phone: [phone]   Fax:  [phone]  Speech Language Pathology Treatment  Patient Details  Name: Ryan Lynn MRN: [id number] Date of Birth: Aug 22, 1934 Referring Provider (SLP): [name], MD   Encounter Date: 10/19/2019  End of Session - 10/19/19 1650    Visit Number  4    Number of Visits  17    Date for SLP Re-Evaluation  01/05/20    SLP Start Time  1532    SLP Stop Time   1612    SLP Time Calculation (min)  40 min       Past Medical History:  Diagnosis Date  . Aortic sclerosis   . Cancer of parotid gland ([facility]) 1988  . Chest pressure   . Dysphagia   . ED (erectile dysfunction)   . Fatigue   . GERD (gastroesophageal reflux disease)   . Glaucoma, bilateral    "severe right; moderate left" (04/21/2015)  . Heart palpitations   . Pneumonia 1930's; 04/21/2015   hosp 04-25-15 asp pheumonia, c-diff  . PVC's (premature ventricular contractions)   . Rectal urgency     Past Surgical History:  Procedure Laterality Date  . CARDIOVASCULAR STRESS TEST     NO ISCHEMIA  . ESOPHAGEAL DILATION N/A 10/18/2015   Procedure: ESOPHAGEAL DILATION;  Surgeon: Rozetta Nunnery, MD;  Location: [facility];  Service: ENT;  Laterality: N/A;  . GLAUCOMA SURGERY Bilateral "several"   "laser"  . INGUINAL HERNIA REPAIR Right 1999  . LARYNGOPLASTY N/A 10/18/2015   Procedure: VOCAL CORD MEDIALIZATION;  Surgeon: Rozetta Nunnery, MD;  Location: [facility];  Service: ENT;  Laterality: N/A;  . PAROTID GLAND TUMOR EXCISION  1988   radiation therapy  . THYROPLASTY N/A 10/18/2015   Procedure: THYROPLASTY;  Surgeon: Rozetta Nunnery, MD;  Location: [facility];  Service: ENT;  Laterality: N/A;  . TONSILLECTOMY  1943  . TRANSTHORACIC ECHOCARDIOGRAM  01/20/2010   NORMAL. EF 55-60%    There were no vitals filed for this  visit.  Subjective Assessment - 10/19/19 1647    Subjective  "ive been doing these about 35 minutes a day, doing them the 3 times."    Currently in Pain?  No/denies            ADULT SLP TREATMENT - 10/19/19 0001      General Information   Behavior/Cognition  Alert;Cooperative;Pleasant mood      Treatment Provided   Treatment provided  Dysphagia;Cognitive-Linquistic      Dysphagia Treatment   Other treatment/comments  Pt worked through dysphagia exercises with modified independence. SLP min cues on tongue pull/base of tongue strengthening and then independent 10 minutes later.       Cognitive-Linquistic Treatment   Treatment focused on  Voice    Skilled Treatment  Pt completed PHorTE exercises with modified independence.       Assessment / Recommendations / Plan   Plan  --   reduce to x1/week due to pt completing HEPs mod Independent     Progression Toward Goals   Progression toward goals  Progressing toward goals       SLP Education - 10/19/19 1650    Education Details  base of tongue pull (gauze exercise)    Person(s) Educated  Patient    Methods  Explanation;Demonstration;Verbal cues    Comprehension  Verbal cues required;Returned demonstration;Verbalized understanding       SLP  Short Term Goals - 10/19/19 1651      SLP SHORT TERM GOAL #1   Title  pt will perform swallow HEP with rare min A over three sessions    Baseline  10-19-19    Time  3    Period  Weeks    Status  On-going      SLP SHORT TERM GOAL #2   Title  pt will perform PHortE exercises with occasional min A over three sessions    Baseline  10-19-19    Time  3    Period  Weeks    Status  On-going      SLP SHORT TERM GOAL #3   Title  pt will demo swallow precautions from [facility] 09-25-19 with rare min A over two sessions    Time  3    Period  Weeks    Status  On-going       SLP Long Term Goals - 10/19/19 1652      SLP LONG TERM GOAL #1   Title  pt will demo swallow HEP with modified  independence over three sessions    Baseline  10-19-19    Time  7    Period  Weeks   or 17 sessions, for all LTGS   Status  On-going      SLP LONG TERM GOAL #2   Title  pt will demo PHortE exercises with modified independence over three sessions    Baseline  10-19-19    Time  7    Period  Weeks    Status  On-going      SLP LONG TERM GOAL #3   Title  pt will show SLP mastery of swallow precautions with POs over 3 sessions    Time  7    Period  Weeks    Status  On-going      SLP LONG TERM GOAL #4   Title  pt will report (subjetively) a higher degree of pleasure when eating than prior to ST    Time  [place] - 10/19/19 1651    Clinical Impression Statement  Per MBSS dated 09-24-19, Mr. Eady presents with an oropharyngeal dysphagia.  Pt was modified indepdent today wiht all HEPs by end of session, He agrees x1/week is sufficient to cont.  He would benefit from skilled ST focusing on improving both swallow and voice function to improve QOL - pt states eating is not pleasurable for him.    Speech Therapy Frequency  1x /week    Duration  --   8 weeks or 17 total visits   Treatment/Interventions  Aspiration precaution training;Pharyngeal strengthening exercises;Diet toleration management by SLP;Trials of upgraded texture/liquids;Cueing hierarchy;Patient/family education;SLP instruction and feedback   PHortE exercises   Potential to Achieve Goals  Fair    Potential Considerations  Previous level of function;Severity of impairments    SLP Home Exercise Plan  provided today    Consulted and Agree with Plan of Care  Patient       Patient will benefit from skilled therapeutic intervention in order to improve the following deficits and impairments:   Oropharyngeal dysphagia  Voice hoarseness    Problem List Patient Active Problem List   Diagnosis Date Noted  . Aspiration pneumonia ([facility]) 04/25/2015  . Malnutrition of moderate degree ([facility])  04/22/2015  . Lung mass   . Pulmonary abscess ([place])   .  HCAP (healthcare-associated pneumonia)   . Pneumonia 04/21/2015  . Obstructive pneumonia 04/21/2015  . Lung abscess ([place]) 04/21/2015  . History of Clostridium difficile infection 04/21/2015  . Hoarseness of voice 10/15/2013  . BPH (benign prostatic hyperplasia) 10/15/2013  . Xerostomia 07/23/2012  . Fatigue 12/26/2011  . PVC's (premature ventricular contractions) 06/25/2011  . Benign hypertensive heart disease without heart failure 06/25/2011  . HEMORRHOIDS-EXTERNAL 09/26/2009  . FECAL INCONTINENCE 09/26/2009  . PERSONAL HX COLONIC POLYPS 09/26/2009  . GERD 07/14/2008  . Dysphagia, pharyngoesophageal phase 07/14/2008  . DIARRHEA 07/14/2008  . COLONIC POLYPS 07/13/2008  . HEMORRHOIDS 07/13/2008  . DIVERTICULOSIS, COLON 07/13/2008    [facility] ,MS, CCC-SLP  10/19/2019, 4:53 PM  [facility] [address] [place], [place], [address] Phone: [phone]   Fax:  [phone]   Name: BISHOP HASBROOK MRN: [id number] Date of Birth: 04-29-1934

## 2019-10-23 ENCOUNTER — Ambulatory Visit: Payer: Medicare Other

## 2019-10-27 ENCOUNTER — Other Ambulatory Visit: Payer: Self-pay

## 2019-10-27 ENCOUNTER — Ambulatory Visit: Payer: Medicare Other

## 2019-10-27 DIAGNOSIS — R1312 Dysphagia, oropharyngeal phase: Secondary | ICD-10-CM | POA: Diagnosis not present

## 2019-10-27 DIAGNOSIS — R490 Dysphonia: Secondary | ICD-10-CM

## 2019-10-27 NOTE — Therapy (Signed)
[facility] [address] [place], [place], [address] Phone: [phone]   Fax:  [phone]  Speech Language Pathology Treatment  Patient Details  Name: Ryan Lynn MRN: [id number] Date of Birth: 11-04-1934 Referring Provider (SLP): Ryan Overly, MD   Encounter Date: 10/27/2019  End of Session - 10/27/19 1339    Visit Number  5    Number of Visits  17    Date for SLP Re-Evaluation  01/05/20    SLP Start Time  1104    SLP Stop Time   1147    SLP Time Calculation (min)  43 min    Activity Tolerance  Patient tolerated treatment well       Past Medical History:  Diagnosis Date  . Aortic sclerosis   . Cancer of parotid gland ([place] [facility]) 1988  . Chest pressure   . Dysphagia   . ED (erectile dysfunction)   . Fatigue   . GERD (gastroesophageal reflux disease)   . Glaucoma, bilateral    "severe right; moderate left" (04/21/2015)  . Heart palpitations   . Pneumonia 1930's; 04/21/2015   hosp 04-25-15 asp pheumonia, c-diff  . PVC's (premature ventricular contractions)   . Rectal urgency     Past Surgical History:  Procedure Laterality Date  . CARDIOVASCULAR STRESS TEST     NO ISCHEMIA  . ESOPHAGEAL DILATION N/A 10/18/2015   Procedure: ESOPHAGEAL DILATION;  Surgeon: Ryan Nunnery, MD;  Location: [facility];  Service: ENT;  Laterality: N/A;  . GLAUCOMA SURGERY Bilateral "several"   "laser"  . INGUINAL HERNIA REPAIR Right 1999  . LARYNGOPLASTY N/A 10/18/2015   Procedure: VOCAL CORD MEDIALIZATION;  Surgeon: Ryan Nunnery, MD;  Location: [facility];  Service: ENT;  Laterality: N/A;  . PAROTID GLAND TUMOR EXCISION  1988   radiation therapy  . THYROPLASTY N/A 10/18/2015   Procedure: THYROPLASTY;  Surgeon: Ryan Nunnery, MD;  Location: [facility];  Service: ENT;  Laterality: N/A;  . TONSILLECTOMY  1943  . TRANSTHORACIC ECHOCARDIOGRAM  01/20/2010    NORMAL. EF 55-60%    There were no vitals filed for this visit.  Subjective Assessment - 10/27/19 1113    Subjective  "I had a meal that I liked on Friday night and after an hour it just wouldn't go down anymore. I had to stop."    Currently in Pain?  No/denies            ADULT SLP TREATMENT - 10/27/19 1115      General Information   Behavior/Cognition  Alert;Cooperative;Pleasant mood      Treatment Provided   Treatment provided  Dysphagia      Dysphagia Treatment   Patient observed directly with PO's  Yes    Type of PO's observed  Dysphagia 1 (puree);Nectar-thick liquids    Liquids provided via  Straw    Pharyngeal Phase Signs & Symptoms  Complaints of residue;Multiple swallows;Immediate throat clear;Other (comment)   throat clear 1/5 swallows   Type of cueing  Verbal   1/3 t bite did not change clearance from 3/4 t bite   Amount of cueing  Minimal    Other treatment/comments  No overt s/s aspiration PNA today. "I give myself half a serving than what I usually had (before this incr'd level of dysphagia), and I found it takes me longer to eat it." Pt complains that with the gargle exercise does not sound like a gargle - SLP reiterated that  likely is due to muscle fibrosis of the musculate of base of tongue and region around base of epglottis. Pt performed the remainder of the HEP with modified independence. POs req'd min A x1 to see if a 1/3 t bite would change pharyngeal stasis from amount after 3/4-1 t bite - and it did not, per pt report. Pt stated to SLP that even with H2O wash x2, pt could still feel and taste applesauce when pt "hocked". Pt c/o reduced oral opening. SLP provided information re: Therabite for pt      Cognitive-Linquistic Treatment   Treatment focused on  --      Assessment / Recommendations / Plan   Plan  --   pt like to decr to [facility]; SLP agreed with pt due to progres     Progression Toward Goals   Progression toward goals  Progressing toward goals        SLP Education - 10/27/19 1338    Education Details  Therabite    Person(s) Educated  Patient    Methods  Explanation;Handout    Comprehension  Verbalized understanding       SLP Short Term Goals - 10/27/19 1341      SLP SHORT TERM GOAL #1   Title  pt will perform swallow HEP with rare min A over three sessions    Baseline  10-19-19, 10-27-19    Time  2    Period  Weeks    Status  On-going      SLP SHORT TERM GOAL #2   Title  pt will perform PHortE exercises with occasional min A over three sessions    Baseline  10-19-19    Time  2    Period  Weeks    Status  On-going      SLP SHORT TERM GOAL #3   Title  pt will demo swallow precautions from [facility] 09-25-19 with rare min A over two sessions    Baseline  10-27-19    Time  2    Period  Weeks    Status  On-going       SLP Long Term Goals - 10/27/19 1341      SLP LONG TERM GOAL #1   Title  pt will demo swallow HEP with modified independence over three sessions    Baseline  10-19-19, 10-27-19    Time  6    Period  Weeks   or 17 sessions, for all LTGS   Status  On-going      SLP LONG TERM GOAL #2   Title  pt will demo PHortE exercises with modified independence over three sessions    Baseline  10-19-19    Time  6    Period  Weeks    Status  On-going      SLP LONG TERM GOAL #3   Title  pt will show SLP mastery of swallow precautions with POs over 3 sessions    Baseline  10-27-19    Time  6    Period  Weeks    Status  On-going      SLP LONG TERM GOAL #4   Title  pt will report (subjetively) a higher degree of pleasure when eating than prior to ST    Time  6    Period  Weeks    Status  On-going       Plan - 10/27/19 1339    Clinical Impression Statement  Per MBSS dated 09-24-19, [name] presents with an  oropharyngeal dysphagia.  Pt was modified indepdent today with dysphagia HEP by end of session,and with swallow precautions. Pt still had x2/week on the schedule and agrees x1/week is sufficient to cont.  ST so he stated he will go to appt desk and change remaining weeks to x1/week.  He would benefit from skilled ST focusing on improving both swallow and voice function to improve QOL - pt states eating is not pleasurable for him.    Speech Therapy Frequency  1x /week    Duration  --   8 weeks or 17 total visits   Treatment/Interventions  Aspiration precaution training;Pharyngeal strengthening exercises;Diet toleration management by SLP;Trials of upgraded texture/liquids;Cueing hierarchy;Patient/family education;SLP instruction and feedback   PHortE exercises   Potential to Achieve Goals  Fair    Potential Considerations  Previous level of function;Severity of impairments    SLP Home Exercise Plan  provided today    Consulted and Agree with Plan of Care  Patient       Patient will benefit from skilled therapeutic intervention in order to improve the following deficits and impairments:   Oropharyngeal dysphagia  Voice hoarseness    Problem List Patient Active Problem List   Diagnosis Date Noted  . Aspiration pneumonia ([place]) 04/25/2015  . Malnutrition of moderate degree ([facility]) 04/22/2015  . Lung mass   . Pulmonary abscess ([facility])   . HCAP (healthcare-associated pneumonia)   . Pneumonia 04/21/2015  . Obstructive pneumonia 04/21/2015  . Lung abscess ([facility]) 04/21/2015  . History of Clostridium difficile infection 04/21/2015  . Hoarseness of voice 10/15/2013  . BPH (benign prostatic hyperplasia) 10/15/2013  . Xerostomia 07/23/2012  . Fatigue 12/26/2011  . PVC's (premature ventricular contractions) 06/25/2011  . Benign hypertensive heart disease without heart failure 06/25/2011  . HEMORRHOIDS-EXTERNAL 09/26/2009  . FECAL INCONTINENCE 09/26/2009  . PERSONAL HX COLONIC POLYPS 09/26/2009  . GERD 07/14/2008  . Dysphagia, pharyngoesophageal phase 07/14/2008  . DIARRHEA 07/14/2008  . COLONIC POLYPS 07/13/2008  . HEMORRHOIDS 07/13/2008  . DIVERTICULOSIS, COLON 07/13/2008    [facility]  ,MS, CCC-SLP  10/27/2019, 1:42 PM  [facility] [address] [place], [place], [address] Phone: [phone]   Fax:  [phone]   Name: Ryan Lynn MRN: [id number] Date of Birth: 04/27/1934

## 2019-10-27 NOTE — Patient Instructions (Addendum)
   PLEASE search "Therabite" on google, and click on "How [employer]..." to watch an introductory video on this  Click on the [facility] site to get more information - let me know what you think about this device for increasing your mouth opening

## 2019-11-03 ENCOUNTER — Ambulatory Visit: Payer: Medicare Other | Attending: Otolaryngology

## 2019-11-03 ENCOUNTER — Other Ambulatory Visit: Payer: Self-pay

## 2019-11-03 DIAGNOSIS — R1312 Dysphagia, oropharyngeal phase: Secondary | ICD-10-CM | POA: Diagnosis not present

## 2019-11-03 DIAGNOSIS — R490 Dysphonia: Secondary | ICD-10-CM | POA: Insufficient documentation

## 2019-11-03 NOTE — Therapy (Signed)
[facility] [address] [place], [place], [address] Phone: [phone]   Fax:  [phone]  Speech Language Pathology Treatment  Patient Details  Name: Ryan Lynn MRN: [id number] Date of Birth: May 23, 1934 Referring Provider (SLP): [name], MD   Encounter Date: 11/03/2019  End of Session - 11/03/19 1251    Visit Number  6    Number of Visits  17    Date for SLP Re-Evaluation  01/05/20    SLP Start Time  1104    SLP Stop Time   1145    SLP Time Calculation (min)  41 min    Activity Tolerance  Patient tolerated treatment well       Past Medical History:  Diagnosis Date  . Aortic sclerosis   . 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 Chest pressure   . Dysphagia   . ED (erectile dysfunction)   . Fatigue   . GERD (gastroesophageal reflux disease)   . Glaucoma, bilateral    "severe right; moderate left" (04/21/2015)  . Heart palpitations   . Pneumonia 1930's; 04/21/2015   hosp 04-25-15 asp pheumonia, c-diff  . PVC's (premature ventricular contractions)   . Rectal urgency     Past Surgical History:  Procedure Laterality Date  . CARDIOVASCULAR STRESS TEST     NO ISCHEMIA  . ESOPHAGEAL DILATION N/A 10/18/2015   Procedure: ESOPHAGEAL DILATION;  Surgeon: Rozetta Nunnery, MD;  Location: [facility];  Service: ENT;  Laterality: N/A;  . GLAUCOMA SURGERY Bilateral "several"   "laser"  . INGUINAL HERNIA REPAIR Right 1999  . LARYNGOPLASTY N/A 10/18/2015   Procedure: VOCAL CORD MEDIALIZATION;  Surgeon: Rozetta Nunnery, MD;  Location: [facility];  Service: ENT;  Laterality: N/A;  . PAROTID GLAND TUMOR EXCISION  1988   radiation therapy  . THYROPLASTY N/A 10/18/2015   Procedure: THYROPLASTY;  Surgeon: Rozetta Nunnery, MD;  Location: [facility];  Service: ENT;  Laterality: N/A;  . TONSILLECTOMY  1943  . TRANSTHORACIC ECHOCARDIOGRAM  01/20/2010   NORMAL.  EF 55-60%    There were no vitals filed for this visit.         ADULT SLP TREATMENT - 11/03/19 1248      General Information   Behavior/Cognition  Alert;Cooperative;Pleasant mood      Treatment Provided   Treatment provided  Dysphagia;Cognitive-Linquistic      Dysphagia Treatment   Other treatment/comments  Pt completed HEP for dysphagia with appropriate procedure. He is comfortable decr frequency to once every other week. Pt remains undecided re: [employer]. We will revisit tihs next session. Pt to check to see re: insurance coverage.       Cognitive-Linquistic Treatment   Treatment focused on  Voice    Skilled Treatment  Pt completed PHorTE exercises with modified independence. SLP redirected pt with one exercise (10 sentences - make them with forceful voice) and pt did so at end of session. SLP and pt agree pt is appropriate to decr to once every other week.       Assessment / Recommendations / Plan   Plan  --   decr to once every other week - possible d/c in 1-2 sessions     Progression Toward Goals   Progression toward goals  Progressing toward goals         SLP Short Term Goals - 11/03/19 1254      SLP SHORT TERM GOAL #1   Title  pt will perform swallow HEP with rare min A over three sessions    Baseline  10-19-19, 10-27-19    Period  Weeks    Status  Achieved      SLP SHORT TERM GOAL #2   Title  pt will perform PHortE exercises with occasional min A over three sessions    Baseline  10-19-19, 11-03-19    Time  1    Period  Weeks    Status  On-going      SLP SHORT TERM GOAL #3   Title  pt will demo swallow precautions from [place] 09-25-19 with rare min A over two sessions    Baseline  10-27-19    Time  1    Period  Weeks    Status  On-going       SLP Long Term Goals - 11/03/19 1254      SLP LONG TERM GOAL #1   Title  pt will demo swallow HEP with modified independence over three sessions    Baseline  10-19-19, 10-27-19    Period  --   or 17  sessions, for all LTGS   Status  Achieved      SLP LONG TERM GOAL #2   Title  pt will demo PHortE exercises with modified independence over three sessions    Baseline  10-19-19, 11-03-19    Time  5    Period  Weeks    Status  On-going      SLP LONG TERM GOAL #3   Title  pt will show SLP mastery of swallow precautions with POs over 3 sessions    Baseline  10-27-19    Time  5    Period  Weeks    Status  On-going      SLP LONG TERM GOAL #4   Title  pt will report (subjetively) a higher degree of pleasure when eating than prior to ST    Time  6    Period  Weeks    Status  On-going       Plan - 11/03/19 1252    Clinical Impression Statement  Per MBSS dated 09-24-19, Mr. Garvey presents with an oropharyngeal dysphagia.  Pt was modified indepdent today with dysphagia HEP by end of session, and also with voice HEP (PHorTE). Pt and SLP agree x1/ every other week is sufficient to cont. ST so he stated he will go to appt desk and change remaining weeks of tx.  He would cont to benefit from skilled ST focusing on improving both swallow and voice function to improve QOL - pt states eating is not pleasurable for him.    Speech Therapy Frequency  --   every other week   Duration  --   8 weeks or 17 total visits   Treatment/Interventions  Aspiration precaution training;Pharyngeal strengthening exercises;Diet toleration management by SLP;Trials of upgraded texture/liquids;Cueing hierarchy;Patient/family education;SLP instruction and feedback   PHortE exercises   Potential to Achieve Goals  Fair    Potential Considerations  Previous level of function;Severity of impairments    SLP Home Exercise Plan  provided today    Consulted and Agree with Plan of Care  Patient       Patient will benefit from skilled therapeutic intervention in order to improve the following deficits and impairments:   Oropharyngeal dysphagia  Voice hoarseness    Problem List Patient Active Problem List   Diagnosis Date  Noted  . Aspiration pneumonia ([facility]) 04/25/2015  . Malnutrition of moderate  degree ([facility]) 04/22/2015  . Lung mass   . Pulmonary abscess ([facility])   . HCAP (healthcare-associated pneumonia)   . Pneumonia 04/21/2015  . Obstructive pneumonia 04/21/2015  . Lung abscess ([facility]) 04/21/2015  . History of Clostridium difficile infection 04/21/2015  . Hoarseness of voice 10/15/2013  . BPH (benign prostatic hyperplasia) 10/15/2013  . Xerostomia 07/23/2012  . Fatigue 12/26/2011  . PVC's (premature ventricular contractions) 06/25/2011  . Benign hypertensive heart disease without heart failure 06/25/2011  . HEMORRHOIDS-EXTERNAL 09/26/2009  . FECAL INCONTINENCE 09/26/2009  . PERSONAL HX COLONIC POLYPS 09/26/2009  . GERD 07/14/2008  . Dysphagia, pharyngoesophageal phase 07/14/2008  . DIARRHEA 07/14/2008  . COLONIC POLYPS 07/13/2008  . HEMORRHOIDS 07/13/2008  . DIVERTICULOSIS, COLON 07/13/2008    [facility] ,MS, CCC-SLP  11/03/2019, 12:55 PM  [facility] [address] [place], [place], [address] Phone: [phone]   Fax:  [phone]   Name: GADIEL DELIMAN MRN: [id number] Date of Birth: 1934-06-05

## 2019-11-17 ENCOUNTER — Ambulatory Visit: Payer: Medicare Other

## 2019-11-20 ENCOUNTER — Ambulatory Visit: Payer: Medicare Other

## 2019-11-20 ENCOUNTER — Other Ambulatory Visit: Payer: Self-pay

## 2019-11-20 DIAGNOSIS — R1312 Dysphagia, oropharyngeal phase: Secondary | ICD-10-CM

## 2019-11-20 DIAGNOSIS — R490 Dysphonia: Secondary | ICD-10-CM

## 2019-11-20 NOTE — Therapy (Signed)
[facility] [address] [place], [place], [address] Phone: [phone]   Fax:  [phone]  Speech Language Pathology Treatment  Patient Details  Name: Ryan Lynn MRN: [id number] Date of Birth: 1934/02/07 Referring Provider (SLP): [name], MD   Encounter Date: 11/20/2019  End of Session - 11/20/19 1632    Visit Number  7    Number of Visits  17    Date for SLP Re-Evaluation  01/05/20    SLP Start Time  [id number]    SLP Stop Time   [id number]    SLP Time Calculation (min)  40 min    Activity Tolerance  Patient tolerated treatment well       Past Medical History:  Diagnosis Date  . Aortic sclerosis   . 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 Chest pressure   . Dysphagia   . ED (erectile dysfunction)   . Fatigue   . GERD (gastroesophageal reflux disease)   . Glaucoma, bilateral    "severe right; moderate left" (04/21/2015)  . Heart palpitations   . Pneumonia 1930's; 04/21/2015   hosp 04-25-15 asp pheumonia, c-diff  . PVC's (premature ventricular contractions)   . Rectal urgency     Past Surgical History:  Procedure Laterality Date  . CARDIOVASCULAR STRESS TEST     NO ISCHEMIA  . ESOPHAGEAL DILATION N/A 10/18/2015   Procedure: ESOPHAGEAL DILATION;  Surgeon: Rozetta Nunnery, MD;  Location: [facility];  Service: ENT;  Laterality: N/A;  . GLAUCOMA SURGERY Bilateral "several"   "laser"  . INGUINAL HERNIA REPAIR Right 1999  . LARYNGOPLASTY N/A 10/18/2015   Procedure: VOCAL CORD MEDIALIZATION;  Surgeon: Rozetta Nunnery, MD;  Location: [facility];  Service: ENT;  Laterality: N/A;  . PAROTID GLAND TUMOR EXCISION  1988   radiation therapy  . THYROPLASTY N/A 10/18/2015   Procedure: THYROPLASTY;  Surgeon: Rozetta Nunnery, MD;  Location: [facility];  Service: ENT;  Laterality: N/A;  . TONSILLECTOMY  1943  . TRANSTHORACIC ECHOCARDIOGRAM  01/20/2010   NORMAL. EF 55-60%    There were no vitals filed for this visit.         ADULT SLP TREATMENT - 11/20/19 1628      General Information   Behavior/Cognition  Alert;Cooperative;Pleasant mood      Treatment Provided   Treatment provided  Dysphagia;Cognitive-Linquistic      Dysphagia Treatment   Other treatment/comments  Pt completed HEP for dysphagia with indpendence. States it took him longer this morning to eat than it has, and he is having to thicken Boost which he did not have to do before. SLP acknowledged pt's infomration, and told pt to cont to perform HEP until 01-01-20. Pt wihtout overt s/sx aspiration PNA today, no overt s/sx aspiration with tsp water PRN for dysphagia HEP.      Cognitive-Linquistic Treatment   Treatment focused on  Voice    Skilled Treatment  Pt completed PHorTE exercises with independence. Pt's pitch range sounded best it has since pt begain PHortE. Told pt to complet PHorTE until 12-15-19.       Assessment / Recommendations / Plan   Plan  --   decr to once every other week - possible d/c in 1-2 sessions     Progression Toward Goals   Progression toward goals  Progressing toward goals         SLP Short Term Goals - 11/03/19 1254  SLP SHORT TERM GOAL #1   Title  pt will perform swallow HEP with rare min A over three sessions    Baseline  10-19-19, 10-27-19    Period  Weeks    Status  Achieved      SLP SHORT TERM GOAL #2   Title  pt will perform PHortE exercises with occasional min A over three sessions    Baseline  10-19-19, 11-03-19    Time  1    Period  Weeks    Status  On-going      SLP SHORT TERM GOAL #3   Title  pt will demo swallow precautions from [place] 09-25-19 with rare min A over two sessions    Baseline  10-27-19    Time  1    Period  Weeks    Status  On-going       SLP Long Term Goals - 11/20/19 1633      SLP LONG TERM GOAL #1   Title  pt will demo swallow HEP with modified independence over three sessions    Baseline   10-19-19, 10-27-19    Period  --   or 17 sessions, for all LTGS   Status  Achieved      SLP LONG TERM GOAL #2   Title  pt will demo PHortE exercises with modified independence over three sessions    Baseline  10-19-19, 11-03-19    Status  Achieved      SLP LONG TERM GOAL #3   Title  pt will show SLP mastery of swallow precautions with POs over 3 sessions    Baseline  10-27-19    Time  4    Period  Weeks    Status  On-going      SLP LONG TERM GOAL #4   Title  pt will report (subjetively) a higher degree of pleasure when eating than prior to ST    Time  5    Period  Weeks    Status  On-going       Plan - 11/20/19 1632    Clinical Impression Statement  Per MBSS dated 09-24-19, Mr. Bradney presents with an oropharyngeal dysphagia.  Pt was indepdent today with dysphagia HEP and also with voice HEP (PHorTE). SLP may decr frequency again next session if pt still indpenendent with HEPs and no overt s/sx aspiraito PNA. He would cont to benefit from skilled ST focusing on improving both swallow and voice function to improve QOL - pt states eating is not pleasurable for him.    Speech Therapy Frequency  --   every other week   Duration  --   8 weeks or 17 total visits   Treatment/Interventions  Aspiration precaution training;Pharyngeal strengthening exercises;Diet toleration management by SLP;Trials of upgraded texture/liquids;Cueing hierarchy;Patient/family education;SLP instruction and feedback   PHortE exercises   Potential to Achieve Goals  Fair    Potential Considerations  Previous level of function;Severity of impairments    SLP Home Exercise Plan  provided today    Consulted and Agree with Plan of Care  Patient       Patient will benefit from skilled therapeutic intervention in order to improve the following deficits and impairments:   Oropharyngeal dysphagia  Voice hoarseness    Problem List Patient Active Problem List   Diagnosis Date Noted  . Aspiration pneumonia ([facility])  04/25/2015  . Malnutrition of moderate degree ([facility]) 04/22/2015  . Lung mass   . Pulmonary abscess ([place])   . HCAP (healthcare-associated  pneumonia)   . Pneumonia 04/21/2015  . Obstructive pneumonia 04/21/2015  . Lung abscess ([facility]) 04/21/2015  . History of Clostridium difficile infection 04/21/2015  . Hoarseness of voice 10/15/2013  . BPH (benign prostatic hyperplasia) 10/15/2013  . Xerostomia 07/23/2012  . Fatigue 12/26/2011  . PVC's (premature ventricular contractions) 06/25/2011  . Benign hypertensive heart disease without heart failure 06/25/2011  . HEMORRHOIDS-EXTERNAL 09/26/2009  . FECAL INCONTINENCE 09/26/2009  . PERSONAL HX COLONIC POLYPS 09/26/2009  . GERD 07/14/2008  . Dysphagia, pharyngoesophageal phase 07/14/2008  . DIARRHEA 07/14/2008  . COLONIC POLYPS 07/13/2008  . HEMORRHOIDS 07/13/2008  . DIVERTICULOSIS, COLON 07/13/2008    [facility] ,[place], CCC-SLP  11/20/2019, 4:34 PM  [facility] [address] [place], [place], [address] Phone: [phone]   Fax:  [phone]   Name: THOR BRENN MRN: [id number] Date of Birth: 12-12-1934

## 2019-12-01 ENCOUNTER — Other Ambulatory Visit: Payer: Self-pay

## 2019-12-01 ENCOUNTER — Ambulatory Visit: Payer: Medicare Other | Attending: Otolaryngology

## 2019-12-01 DIAGNOSIS — R1312 Dysphagia, oropharyngeal phase: Secondary | ICD-10-CM | POA: Diagnosis present

## 2019-12-01 DIAGNOSIS — R490 Dysphonia: Secondary | ICD-10-CM | POA: Insufficient documentation

## 2019-12-01 NOTE — Therapy (Signed)
[facility] [address] [place], [place], [address] Phone: [phone]   Fax:  [phone]  Speech Language Pathology Treatment  Patient Details  Name: Ryan Lynn MRN: [id number] Date of Birth: 09-Mar-1934 Referring Provider (SLP): [name], MD   Encounter Date: 12/01/2019  End of Session - 12/01/19 1301    Visit Number  8    Number of Visits  17    Date for SLP Re-Evaluation  01/05/20    SLP Start Time  1104    SLP Stop Time   1143    SLP Time Calculation (min)  39 min    Activity Tolerance  Patient tolerated treatment well       Past Medical History:  Diagnosis Date  . Aortic sclerosis   . 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 Chest pressure   . Dysphagia   . ED (erectile dysfunction)   . Fatigue   . GERD (gastroesophageal reflux disease)   . Glaucoma, bilateral    "severe right; moderate left" (04/21/2015)  . Heart palpitations   . Pneumonia 1930's; 04/21/2015   hosp 04-25-15 asp pheumonia, c-diff  . PVC's (premature ventricular contractions)   . Rectal urgency     Past Surgical History:  Procedure Laterality Date  . CARDIOVASCULAR STRESS TEST     NO ISCHEMIA  . ESOPHAGEAL DILATION N/A 10/18/2015   Procedure: ESOPHAGEAL DILATION;  Surgeon: Rozetta Nunnery, MD;  Location: [facility];  Service: ENT;  Laterality: N/A;  . GLAUCOMA SURGERY Bilateral "several"   "laser"  . INGUINAL HERNIA REPAIR Right 1999  . LARYNGOPLASTY N/A 10/18/2015   Procedure: VOCAL CORD MEDIALIZATION;  Surgeon: Rozetta Nunnery, MD;  Location: [facility];  Service: ENT;  Laterality: N/A;  . PAROTID GLAND TUMOR EXCISION  1988   radiation therapy  . THYROPLASTY N/A 10/18/2015   Procedure: THYROPLASTY;  Surgeon: Rozetta Nunnery, MD;  Location: [facility];  Service: ENT;  Laterality: N/A;  . TONSILLECTOMY  1943  . TRANSTHORACIC ECHOCARDIOGRAM  01/20/2010   NORMAL.  EF 55-60%    There were no vitals filed for this visit.  Subjective Assessment - 12/01/19 1109    Subjective  "Took me an hour." (to eat thanksgiving meal)    Currently in Pain?  No/denies            ADULT SLP TREATMENT - 12/01/19 1110      General Information   Behavior/Cognition  Alert;Cooperative;Pleasant mood      Treatment Provided   Treatment provided  Cognitive-Linquistic;Dysphagia      Dysphagia Treatment   Other treatment/comments  Pt reports no commonality with types of foods that take shorter or longer amount of time to eat. Pt reports sometimes food with a shorter amount of time to eat take longer an vice versa. Same thing with his HEP - sometimes exercises go slower and sometimes longer, pt cannot draw any commonalities (e.g., effortful swallow always go quicker than [place]) - pt reports always getting exercises done in 20-25 minutes. Pt has "suspended" yawn exercise due to pain related to his obturator.       Cognitive-Linquistic Treatment   Treatment focused on  Voice    Skilled Treatment  Voice quality relatively unchanged since last session. Pt req'd initial cues for SOVT exercises to just "hum with the straw in your mouth" instead of moving tongue to form words. PhoRTE exercises req'd min A occasionally for incr'd volume on exercise #  3/4 (caling over fence and scolding the dog). SLP inquired pt and he was comfortable with next appointment in 2-3 weeks.       Assessment / Recommendations / Plan   Plan  Other (Comment)   next visit end of December to follow up     Progression Toward Goals   Progression toward goals  Progressing toward goals       SLP Education - 12/01/19 1301    Education Details  SBA with procedure for PHortE    Person(s) Educated  Patient    Methods  Explanation;Demonstration    Comprehension  Verbalized understanding;Returned demonstration       SLP Short Term Goals - 12/01/19 1303      SLP SHORT TERM GOAL #1   Title  pt will  perform swallow HEP with rare min A over three sessions    Baseline  10-19-19, 10-27-19    Period  Weeks    Status  Achieved      SLP SHORT TERM GOAL #2   Title  pt will perform PHortE exercises with occasional min A over three sessions    Status  Achieved      SLP [place] #3   Title  pt will demo swallow precautions from [place] 09-25-19 with rare min A over two sessions    Baseline  10-27-19    Status  Partially Met       SLP Long Term Goals - 12/01/19 1304      SLP LONG TERM GOAL #1   Title  pt will demo swallow HEP with modified independence over three sessions    Baseline  10-19-19, 10-27-19    Period  --   or 17 sessions, for all LTGS   Status  Achieved      SLP LONG TERM GOAL #2   Title  pt will demo PHortE exercises with modified independence over three sessions    Baseline  10-19-19, 11-03-19    Status  Achieved      SLP LONG TERM GOAL #3   Title  pt will show SLP mastery of swallow precautions with POs over 3 sessions    Baseline  10-27-19    Time  3    Period  Weeks    Status  On-going      SLP LONG TERM GOAL #4   Title  pt will report (subjetively) a higher degree of pleasure when eating than prior to ST    Time  4    Period  Weeks    Status  On-going       Plan - 12/01/19 1302    Clinical Impression Statement  Per MBSS dated 09-24-19, [name] presents with an oropharyngeal dysphagia.  Pt was indepdent today with dysphagia HEP, and SBA req'd with voice HEP (PHorTE). SLP and pt agreed next appointment in 2-3 weeks. Possible d/c at that time. No overt s/sx aspiraiton with POs today, and no overt s/sx aspiration PNA. He would cont to benefit from skilled ST focusing on improving both swallow and voice function to improve QOL - pt states eating is not pleasurable for him.    Speech Therapy Frequency  --   every other week   Duration  --   8 weeks or 17 total visits   Treatment/Interventions  Aspiration precaution training;Pharyngeal strengthening  exercises;Diet toleration management by SLP;Trials of upgraded texture/liquids;Cueing hierarchy;Patient/family education;SLP instruction and feedback   PHortE exercises   Potential to [place]  Potential Considerations  Previous level of function;Severity of impairments    SLP Home Exercise Plan  provided today    Consulted and Agree with Plan of Care  Patient       Patient will benefit from skilled therapeutic intervention in order to improve the following deficits and impairments:   Oropharyngeal dysphagia  Voice hoarseness    Problem List Patient Active Problem List   Diagnosis Date Noted  . Aspiration pneumonia ([facility]) 04/25/2015  . Malnutrition of moderate degree ([facility]) 04/22/2015  . Lung mass   . Pulmonary abscess ([facility])   . HCAP (healthcare-associated pneumonia)   . Pneumonia 04/21/2015  . Obstructive pneumonia 04/21/2015  . Lung abscess ([facility]) 04/21/2015  . History of Clostridium difficile infection 04/21/2015  . Hoarseness of voice 10/15/2013  . BPH (benign prostatic hyperplasia) 10/15/2013  . Xerostomia 07/23/2012  . Fatigue 12/26/2011  . PVC's (premature ventricular contractions) 06/25/2011  . Benign hypertensive heart disease without heart failure 06/25/2011  . HEMORRHOIDS-EXTERNAL 09/26/2009  . FECAL INCONTINENCE 09/26/2009  . PERSONAL HX COLONIC POLYPS 09/26/2009  . GERD 07/14/2008  . Dysphagia, pharyngoesophageal phase 07/14/2008  . DIARRHEA 07/14/2008  . COLONIC POLYPS 07/13/2008  . HEMORRHOIDS 07/13/2008  . DIVERTICULOSIS, COLON 07/13/2008    [facility] ,[place], CCC-SLP  12/01/2019, 1:05 PM  [facility] [address] [place], [place], [address] Phone: [phone]   Fax:  [phone]   Name: Ryan Lynn MRN: [id number] Date of Birth: Apr 14, 1934

## 2019-12-29 ENCOUNTER — Other Ambulatory Visit: Payer: Self-pay

## 2019-12-29 ENCOUNTER — Ambulatory Visit: Payer: Medicare Other

## 2019-12-29 DIAGNOSIS — R1312 Dysphagia, oropharyngeal phase: Secondary | ICD-10-CM | POA: Diagnosis not present

## 2019-12-29 DIAGNOSIS — R490 Dysphonia: Secondary | ICD-10-CM

## 2019-12-29 NOTE — Patient Instructions (Signed)
   [facility]  [phone])  ==============================================  Signs of Aspiration Pneumonia   . Chest pain/tightness . Fever (can be low grade) . Cough  o With foul-smelling phlegm (sputum) o With sputum containing pus or blood o With greenish sputum . Fatigue  . Shortness of breath  . Wheezing   **IF YOU HAVE THESE SIGNS, CONTACT YOUR DOCTOR OR GO TO THE EMERGENCY DEPARTMENT OR URGENT CARE AS SOON AS POSSIBLE**

## 2019-12-29 NOTE — Therapy (Signed)
[facility] [address] [place], [place], [address] Phone: [phone]   Fax:  [phone]  Speech Language Pathology Treatment/ discharge summary  Patient Details  Name: Ryan Lynn MRN: [id number] Date of Birth: 05-05-1934 Referring Provider (SLP): [name], MD   Encounter Date: 12/29/2019  End of Session - 12/29/19 1310    Visit Number  9    Number of Visits  17    Date for SLP Re-Evaluation  01/05/20    SLP Start Time  9242    SLP Stop Time   1231    SLP Time Calculation (min)  43 min    Activity Tolerance  Patient tolerated treatment well       Past Medical History:  Diagnosis Date  . Aortic sclerosis   . 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 Chest pressure   . Dysphagia   . ED (erectile dysfunction)   . Fatigue   . GERD (gastroesophageal reflux disease)   . Glaucoma, bilateral    "severe right; moderate left" (04/21/2015)  . Heart palpitations   . Pneumonia 1930's; 04/21/2015   hosp 04-25-15 asp pheumonia, c-diff  . PVC's (premature ventricular contractions)   . Rectal urgency     Past Surgical History:  Procedure Laterality Date  . CARDIOVASCULAR STRESS TEST     NO ISCHEMIA  . ESOPHAGEAL DILATION N/A 10/18/2015   Procedure: ESOPHAGEAL DILATION;  Surgeon: Rozetta Nunnery, MD;  Location: [facility];  Service: ENT;  Laterality: N/A;  . GLAUCOMA SURGERY Bilateral "several"   "laser"  . INGUINAL HERNIA REPAIR Right 1999  . LARYNGOPLASTY N/A 10/18/2015   Procedure: VOCAL CORD MEDIALIZATION;  Surgeon: Rozetta Nunnery, MD;  Location: [facility];  Service: ENT;  Laterality: N/A;  . PAROTID GLAND TUMOR EXCISION  1988   radiation therapy  . THYROPLASTY N/A 10/18/2015   Procedure: THYROPLASTY;  Surgeon: Rozetta Nunnery, MD;  Location: [facility];  Service: ENT;  Laterality: N/A;  . TONSILLECTOMY  1943  . TRANSTHORACIC ECHOCARDIOGRAM   01/20/2010   NORMAL. EF 55-60%    There were no vitals filed for this visit.         ADULT SLP TREATMENT - 12/29/19 1154      General Information   Behavior/Cognition  Alert;Cooperative;Pleasant mood      Treatment Provided   Treatment provided  Dysphagia;Cognitive-Linquistic      Dysphagia Treatment   Oral Cavity - Dentition  Missing dentition   on lt side   Type of PO's observed  Dysphagia 1 (puree);Nectar-thick liquids    Feeding  Able to feed self    Liquids provided via  Straw    Pharyngeal Phase Signs & Symptoms  Complaints of residue;Multiple swallows;Immediate throat clear   throat clear 3/12 sips   Other treatment/comments  Pt has had waffles, scrambled eggs, country style steak (very small bites), mac and cheese, pasta. Pt reports eating is sitll not a pleasurable event. LP reiterated soft and moist foods, nectar liquids. "Everything I drink excpet the Boost includes the thickener." Pt reports his "nectar" is  not as thick as SLP "nectar". Pt denies overt s/s aspiration PNA currently. Despite pt frequency of throat clears, he has not given any indication of overt s/sx aspiraiton PNA. Pt followed swallow precautions without cues from SLP; no difference in throat clear frequency with pt's "nectar" vs SLP "nectar". Swallow HEP: pt would like ot leave off the yawn exercise due  to pain-SLP agreed. Pt requires SBA with swallow HEP procedure. Still has air leakage at glottis with super supraglottic.  SLP told pt again [facility] ST and pt expressed possible interest for the future. SLP told pt if he desired to talk with his ENT.      Cognitive-Linquistic Treatment   Treatment focused on  Voice    Skilled Treatment  Voice quality unchanged since last session 12-02-19. Pt agrees.       Assessment / Recommendations / Plan   Plan  Discharge SLP treatment due to (comment)   max potential reached     Progression Toward Goals   Progression toward goals  --    d/c day - see goals      SLP Education - 12/29/19 1310    Education Details  [facility] voice[facility]swallow [facility], pt "nectar" slightly less thick than recommended consistency    Person(s) Educated  Patient    Methods  Explanation;Handout    Comprehension  Verbalized understanding       SLP Short Term Goals - 12/29/19 1313      SLP SHORT TERM GOAL #1   Title  pt will perform swallow HEP with rare min A over three sessions    Baseline  10-19-19, 10-27-19    Period  Weeks    Status  Achieved      SLP SHORT TERM GOAL #2   Title  pt will perform PHortE exercises with occasional min A over three sessions    Status  Achieved      SLP [place] #3   Title  pt will demo swallow precautions from [place] 09-25-19 with rare min A over two sessions    Baseline  10-27-19    Status  Partially Met       SLP Long Term Goals - 12/29/19 1314      SLP LONG TERM GOAL #1   Title  pt will demo swallow HEP with modified independence over three sessions    Baseline  10-19-19, 10-27-19    Period  --   or 17 sessions, for all LTGS   Status  Achieved      SLP LONG TERM GOAL #2   Title  pt will demo PHortE exercises with modified independence over three sessions    Baseline  10-19-19, 11-03-19    Status  Achieved      SLP LONG TERM GOAL #3   Title  pt will show SLP mastery of swallow precautions with POs over 3 sessions    Baseline  10-27-19, 12-29-19    Status  Partially Met      SLP LONG TERM GOAL #4   Title  pt will report (subjetively) a higher degree of pleasure when eating than prior to ST    Status  Not Met       Plan - 12/29/19 1311    Clinical Impression Statement  Per MBSS dated 09-24-19, Mr. Kloth presents with an oropharyngeal dysphagia.  Pt req'd SBA with dysphagia HEP. Pt tells SLP no change in his voice quality or swallowing ability since last visit (4 weeks ago). SLP told pt that from this SLP perspective pt had reached his max potential and d/c was appropriate at that time. Pt  agreed with d/c No overt s/sx aspiration PNA.    Speech Therapy Frequency  --   every other week   Duration  --   8 weeks or 17 total visits   Treatment/Interventions  Aspiration precaution training;Pharyngeal  strengthening exercises;Diet toleration management by SLP;Trials of upgraded texture/liquids;Cueing hierarchy;Patient/family education;SLP instruction and feedback   PHortE exercises   Potential to Achieve Goals  Fair    Potential Considerations  Previous level of function;Severity of impairments    SLP Home Exercise Plan  provided today    Consulted and Agree with Plan of Care  Patient       Patient will benefit from skilled therapeutic intervention in order to improve the following deficits and impairments:   Oropharyngeal dysphagia  Voice hoarseness   SPEECH THERAPY DISCHARGE SUMMARY  Visits from Start of Care: 9  Current functional level related to goals / functional outcomes: Pt had little notable improvement in both swallowing ability or voice quality since evaluation. He was faithful in completing his voice and swallowing exercises for strengthening that musculature.  He reports eating is still not a pleasurable activity for him.   Remaining deficits: Oropharyngeal dysphagia, voice hoarseness   Education / Equipment: HEPs, pt may want to pursue voice/swallow therapy at [facility]: Patient agrees to discharge.  Patient goals were partially met. Patient is being discharged due to                                                     ?????meeting his current rehab potential.       Problem List Patient Active Problem List   Diagnosis Date Noted  . Aspiration pneumonia ([place]) 04/25/2015  . Malnutrition of moderate degree ([place]) 04/22/2015  . Lung mass   . Pulmonary abscess ([place])   . HCAP (healthcare-associated pneumonia)   . Pneumonia 04/21/2015  . Obstructive pneumonia 04/21/2015  . Lung abscess ([place]) 04/21/2015  . History of Clostridium difficile infection  04/21/2015  . Hoarseness of voice 10/15/2013  . BPH (benign prostatic hyperplasia) 10/15/2013  . Xerostomia 07/23/2012  . Fatigue 12/26/2011  . PVC's (premature ventricular contractions) 06/25/2011  . Benign hypertensive heart disease without heart failure 06/25/2011  . HEMORRHOIDS-EXTERNAL 09/26/2009  . FECAL INCONTINENCE 09/26/2009  . PERSONAL HX COLONIC POLYPS 09/26/2009  . GERD 07/14/2008  . Dysphagia, pharyngoesophageal phase 07/14/2008  . DIARRHEA 07/14/2008  . COLONIC POLYPS 07/13/2008  . HEMORRHOIDS 07/13/2008  . DIVERTICULOSIS, COLON 07/13/2008    [facility] ,MS, CCC-SLP  12/29/2019, 1:17 PM  [facility] [address] [place], [place], [address] Phone: [phone]   Fax:  [phone]   Name: Ryan Lynn MRN: [id number] Date of Birth: May 09, 1934

## 2020-01-30 ENCOUNTER — Ambulatory Visit: Payer: Medicare Other

## 2020-02-01 ENCOUNTER — Ambulatory Visit: Payer: Medicare Other

## 2020-02-05 ENCOUNTER — Ambulatory Visit: Payer: Medicare Other | Attending: Internal Medicine

## 2020-02-05 DIAGNOSIS — Z23 Encounter for immunization: Secondary | ICD-10-CM | POA: Insufficient documentation

## 2020-02-05 NOTE — Progress Notes (Signed)
   Covid-19 Vaccination Clinic  Name:  Ryan Lynn    MRN: [id number] DOB: 1934/03/31  02/05/2020  [name] was observed post Covid-19 immunization for 15 minutes without incidence. He was provided with Vaccine Information Sheet and instruction to access the V-Safe system.   [name] was instructed to call 911 with any severe reactions post vaccine: [place] Kitchen Difficulty breathing  . Swelling of your face and throat  . A fast heartbeat  . A bad rash all over your body  . Dizziness and weakness    Immunizations Administered    Name Date Dose VIS Date Route   Pfizer COVID-19 Vaccine 02/05/2020  2:07 PM 0.3 mL 12/11/2019 Intramuscular   Manufacturer: [place]   Lot: [id number]   [place]: [id number]

## 2020-03-01 ENCOUNTER — Ambulatory Visit: Payer: Medicare Other | Attending: Internal Medicine

## 2020-03-01 DIAGNOSIS — Z23 Encounter for immunization: Secondary | ICD-10-CM | POA: Insufficient documentation

## 2020-03-01 NOTE — Progress Notes (Signed)
   Covid-19 Vaccination Clinic  Name:  Ryan Lynn    MRN: [id number] DOB: 01/19/1934  03/01/2020  [name] was observed post Covid-19 immunization for 15 minutes without incident. He was provided with Vaccine Information Sheet and instruction to access the V-Safe system.   [name] was instructed to call 911 with any severe reactions post vaccine: Marland Kitchen Difficulty breathing  . Swelling of face and throat  . A fast heartbeat  . A bad rash all over body  . Dizziness and weakness   Immunizations Administered    Name Date Dose VIS Date Route   Pfizer COVID-19 Vaccine 03/01/2020  2:03 PM 0.3 mL 12/11/2019 Intramuscular   Manufacturer: [place]   Lot: [id number]   [place]: [id number]

## 2020-05-17 ENCOUNTER — Encounter: Payer: Medicare Other | Attending: Internal Medicine | Admitting: Dietician

## 2020-05-17 ENCOUNTER — Other Ambulatory Visit: Payer: Self-pay

## 2020-05-17 DIAGNOSIS — E440 Moderate protein-calorie malnutrition: Secondary | ICD-10-CM

## 2020-05-17 DIAGNOSIS — Z713 Dietary counseling and surveillance: Secondary | ICD-10-CM | POA: Insufficient documentation

## 2020-05-17 DIAGNOSIS — R1314 Dysphagia, pharyngoesophageal phase: Secondary | ICD-10-CM

## 2020-05-17 DIAGNOSIS — R634 Abnormal weight loss: Secondary | ICD-10-CM | POA: Diagnosis not present

## 2020-05-17 DIAGNOSIS — Z681 Body mass index (BMI) 19 or less, adult: Secondary | ICD-10-CM | POA: Diagnosis not present

## 2020-05-17 NOTE — Progress Notes (Signed)
Medical Nutrition Therapy:  Appt start time: 1610 end time:  9604.   Assessment:  Primary concerns today: Patient is here today with his wife.  They would like ideas to improve his nutritional status and help him gain weight.  He states that his appetite is not bad but the swallowing is not an issue.  He states that textures will not go down and the texture that does not work varies.  For example mashed potatoes, grits, dried beans, (even when thinned).  It takes increased work at times to eat at times. He takes liquids via a straw and is unable to eat soup due to anatomy post surgery. He uses [employer] Thicken up [employer] and is not happy with this as it leave granules at the bottom of the glass.  History includes surgery and radiation for parotid tumor 1988, moderate protein calorie malnutrition,  xerostomia and HTN.  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 Most recent MBS 09/25/2019 showed worsening oropharyngeal dysphagia with recommendations for a Dysphagia 1 (pureed) solids; Nectar thick liquid.  Liquids with a straw.  Noted silent aspiration with thin and nectar thick liquids.  Patient eats some foods that are not dysphagia 1 when very most.  He states that there is a fish restaurant that is his favorite and goes once per week.  He followed up with SLP for 3 months last fall for swallowing (2/3) and voice (1/3) and found this very helpful. Patient continues to meet criteria for malnutrition based on weight loss and loss and muscle mass as well as inadequate oral intake  Medications include:  Vitamin D, MVI, and Probiotic  Weight hx:   147 lbs today (BMI 19.94) 154 lbs 02/05/20 159 lbs 02/24/2016  Preferred Learning Style:   No preference indicated   Learning Readiness:   Ready  Change in progress   DIETARY INTAKE: Very soft foods, nectar thick  liquids  Everyday foods include milk.  Avoided foods include ice cream.   Too many Boost causes increased gas. 24-hr recall:  B (8:30 AM): 2/3 banana, 1/2 cup applesauce, 3 extra thick frozen waffles with syrup (dips in coffee), link sausage  Snk ( AM):  none L (12-1 PM): meat loaf, creamed potatoes, carrots,OR fettuccini pasta or mac and cheese and broccoli, green beans, or carrots, collards cooked soft Snk ( PM): Boost Plus D ( PM): similar to lunch Snk ( PM): none Beverages: 1-2 Boost Plus Daily, water, coffee, sugar, sweet tea, all thickened  Usual physical activity: ADL's  (has been having knee pain)  Estimated energy needs: 2200 calories 85 g protein  Progress Towards Goal(s):  In progress.   Nutritional Diagnosis:  [name]-3.2 Unintentional weight loss As related to dysphagia.  As evidenced by weight loss of 7 lbs in the past 3 months and inadequate oral intake.    Intervention:  Nutrition counseling/education related to nutrition to improve weight and nutrition status working with barriers of dysphagia. Suggested options for pre thickened juice.  They are comfortable with the computer but for the most part prefer to purchase products locally.  Showed them Magic Cup option (which can be eaten chilled rather than frozen but the preferred other options.  Discussed his supplement choice and alternatives that do not contain inulin if this is causing problems with his bowels. Recommend follow up with his SLP, [name]  Plan: Breakfast, Lunch, Dinner daily  with 2-3 snacks daily.    Aim for each snack to be about 500 calories.  Choose whole milk yogurt  Add jam or pureed fruit (baby food) to flavor as desired Pudding made with whole milk Use whole milk Increase calories with extra butter or olive oil Spread butter on waffles and biscuits and bread and dunk in your thickened coffee (as you prefer) Consider alternative thickeners- Ask [name] (your speech therapist) for recommendations. Read  labels to avoid nutritional supplements with Inulin if you are having problems with diarrhea.  Teaching Method Utilized:  Visual Auditory  Handouts mailed after this visit include:  Suggestions for Increasing Calories and Protein from AND  High-Calorie, High-Protein Recipes from AND  Underweight Nutrition Therapy from AND  Barriers to learning/adherence to lifestyle change: dysphagia  Demonstrated degree of understanding via:  Teach Back   Monitoring/Evaluation:  Dietary intake, exercise, label reading, and body weight prn.  Patient or his wife to call or e-mail for any questions.

## 2020-05-17 NOTE — Patient Instructions (Addendum)
Breakfast, Lunch, Dinner daily with 2-3 snacks daily.    Aim for each snack to be about 500 calories.  Choose whole milk yogurt  Add jam or pureed fruit (baby food) to flavor as desired Pudding made with whole milk Use whole milk Increase calories with extra butter or olive oil Spread butter on waffles and biscuits and bread and dunk in your thickened coffee (as you prefer)    Consider alternative thickeners- Ask [name] (your speech therapist) for recommendations.  Read labels to avoid nutritional supplements with Inulin if you are having problems with diarrhea.

## 2020-09-15 ENCOUNTER — Encounter (HOSPITAL_COMMUNITY): Payer: Self-pay

## 2020-09-15 ENCOUNTER — Ambulatory Visit
Admission: EM | Admit: 2020-09-15 | Discharge: 2020-09-15 | Discharge status: Absent without leave | Payer: Medicare Other

## 2020-09-15 ENCOUNTER — Other Ambulatory Visit: Payer: Self-pay

## 2020-09-15 ENCOUNTER — Emergency Department (HOSPITAL_COMMUNITY)
Admission: EM | Admit: 2020-09-15 | Discharge: 2020-09-15 | Disposition: A | Discharge status: Home / Self Care | Payer: Medicare Other | Attending: Emergency Medicine | Admitting: Emergency Medicine

## 2020-09-15 DIAGNOSIS — W01198A Fall on same level from slipping, tripping and stumbling with subsequent striking against other object, initial encounter: Secondary | ICD-10-CM | POA: Diagnosis not present

## 2020-09-15 DIAGNOSIS — S0990XA Unspecified injury of head, initial encounter: Secondary | ICD-10-CM | POA: Insufficient documentation

## 2020-09-15 DIAGNOSIS — Z5321 Procedure and treatment not carried out due to patient leaving prior to being seen by health care provider: Secondary | ICD-10-CM | POA: Insufficient documentation

## 2020-09-15 DIAGNOSIS — Y92002 Bathroom of unspecified non-institutional (private) residence single-family (private) house as the place of occurrence of the external cause: Secondary | ICD-10-CM | POA: Diagnosis not present

## 2020-09-15 LAB — URINALYSIS, ROUTINE W REFLEX MICROSCOPIC
Bilirubin Urine: NEGATIVE
Glucose, UA: NEGATIVE mg/dL
Hgb urine dipstick: NEGATIVE
Ketones, ur: NEGATIVE mg/dL
Leukocytes,Ua: NEGATIVE
Nitrite: NEGATIVE
Protein, ur: NEGATIVE mg/dL
Specific Gravity, Urine: 1.018 (ref 1.005–1.030)
pH: 6 (ref 5.0–8.0)

## 2020-09-15 LAB — CBC
HCT: 42.4 % (ref 39.0–52.0)
Hemoglobin: 13.9 g/dL (ref 13.0–17.0)
MCH: 31.2 pg (ref 26.0–34.0)
MCHC: 32.8 g/dL (ref 30.0–36.0)
MCV: 95.3 fL (ref 80.0–100.0)
Platelets: 229 10*3/uL (ref 150–400)
RBC: 4.45 MIL/uL (ref 4.22–5.81)
RDW: 13.3 % (ref 11.5–15.5)
WBC: 9.4 10*3/uL (ref 4.0–10.5)
nRBC: 0 % (ref 0.0–0.2)

## 2020-09-15 LAB — BASIC METABOLIC PANEL
Anion gap: 11 (ref 5–15)
BUN: 21 mg/dL (ref 8–23)
CO2: 30 mmol/L (ref 22–32)
Calcium: 10.2 mg/dL (ref 8.9–10.3)
Chloride: 97 mmol/L — ABNORMAL LOW (ref 98–111)
Creatinine, Ser: 0.85 mg/dL (ref 0.61–1.24)
GFR calc Af Amer: >60 mL/min (ref >60)
GFR calc non Af Amer: >60 mL/min (ref >60)
Glucose, Bld: 113 mg/dL — ABNORMAL HIGH (ref 70–99)
Potassium: 4.6 mmol/L (ref 3.5–5.1)
Sodium: 138 mmol/L (ref 135–145)

## 2020-09-15 LAB — CBG MONITORING, ED: Glucose-Capillary: 117 mg/dL — ABNORMAL HIGH (ref 70–99)

## 2020-09-15 NOTE — ED Triage Notes (Signed)
Patient states he had LOC last night while in the bathroom and hit his head on a linoleum floor. Patient does not remember the fall, but his wife found him on the floor.

## 2020-09-17 ENCOUNTER — Emergency Department (HOSPITAL_BASED_OUTPATIENT_CLINIC_OR_DEPARTMENT_OTHER): Payer: Medicare Other

## 2020-09-17 ENCOUNTER — Encounter (HOSPITAL_BASED_OUTPATIENT_CLINIC_OR_DEPARTMENT_OTHER): Payer: Self-pay | Admitting: Emergency Medicine

## 2020-09-17 ENCOUNTER — Emergency Department (HOSPITAL_BASED_OUTPATIENT_CLINIC_OR_DEPARTMENT_OTHER)
Admission: EM | Admit: 2020-09-17 | Discharge: 2020-09-17 | Disposition: A | Discharge status: Home / Self Care | Payer: Medicare Other | Attending: Emergency Medicine | Admitting: Emergency Medicine

## 2020-09-17 ENCOUNTER — Other Ambulatory Visit: Payer: Self-pay

## 2020-09-17 DIAGNOSIS — I609 Nontraumatic subarachnoid hemorrhage, unspecified: Secondary | ICD-10-CM

## 2020-09-17 DIAGNOSIS — Y92002 Bathroom of unspecified non-institutional (private) residence single-family (private) house as the place of occurrence of the external cause: Secondary | ICD-10-CM | POA: Diagnosis not present

## 2020-09-17 DIAGNOSIS — I119 Hypertensive heart disease without heart failure: Secondary | ICD-10-CM | POA: Diagnosis not present

## 2020-09-17 DIAGNOSIS — R55 Syncope and collapse: Secondary | ICD-10-CM | POA: Diagnosis not present

## 2020-09-17 DIAGNOSIS — Y9389 Activity, other specified: Secondary | ICD-10-CM | POA: Insufficient documentation

## 2020-09-17 DIAGNOSIS — W228XXA Striking against or struck by other objects, initial encounter: Secondary | ICD-10-CM | POA: Diagnosis not present

## 2020-09-17 DIAGNOSIS — S0990XA Unspecified injury of head, initial encounter: Secondary | ICD-10-CM | POA: Diagnosis present

## 2020-09-17 DIAGNOSIS — S066X0A Traumatic subarachnoid hemorrhage without loss of consciousness, initial encounter: Secondary | ICD-10-CM | POA: Insufficient documentation

## 2020-09-17 DIAGNOSIS — C07 Malignant neoplasm of parotid gland: Secondary | ICD-10-CM | POA: Insufficient documentation

## 2020-09-17 LAB — BASIC METABOLIC PANEL
Anion gap: 10 (ref 5–15)
BUN: 18 mg/dL (ref 8–23)
CO2: 27 mmol/L (ref 22–32)
Calcium: 9 mg/dL (ref 8.9–10.3)
Chloride: 97 mmol/L — ABNORMAL LOW (ref 98–111)
Creatinine, Ser: 0.8 mg/dL (ref 0.61–1.24)
GFR calc Af Amer: >60 mL/min (ref >60)
GFR calc non Af Amer: >60 mL/min (ref >60)
Glucose, Bld: 95 mg/dL (ref 70–99)
Potassium: 4.2 mmol/L (ref 3.5–5.1)
Sodium: 134 mmol/L — ABNORMAL LOW (ref 135–145)

## 2020-09-17 LAB — URINALYSIS, ROUTINE W REFLEX MICROSCOPIC
Bilirubin Urine: NEGATIVE
Glucose, UA: NEGATIVE mg/dL
Hgb urine dipstick: NEGATIVE
Ketones, ur: NEGATIVE mg/dL
Leukocytes,Ua: NEGATIVE
Nitrite: NEGATIVE
Protein, ur: NEGATIVE mg/dL
Specific Gravity, Urine: 1.015 (ref 1.005–1.030)
pH: 7 (ref 5.0–8.0)

## 2020-09-17 LAB — CBC
HCT: 39.4 % (ref 39.0–52.0)
Hemoglobin: 12.9 g/dL — ABNORMAL LOW (ref 13.0–17.0)
MCH: 31 pg (ref 26.0–34.0)
MCHC: 32.7 g/dL (ref 30.0–36.0)
MCV: 94.7 fL (ref 80.0–100.0)
Platelets: 189 10*3/uL (ref 150–400)
RBC: 4.16 MIL/uL — ABNORMAL LOW (ref 4.22–5.81)
RDW: 13.1 % (ref 11.5–15.5)
WBC: 8 10*3/uL (ref 4.0–10.5)
nRBC: 0 % (ref 0.0–0.2)

## 2020-09-17 LAB — TROPONIN I (HIGH SENSITIVITY): Troponin I (High Sensitivity): 3 ng/L (ref <18)

## 2020-09-17 MED ORDER — GADOBUTROL 1 MMOL/ML IV SOLN
6.6000 mL | Freq: Once | INTRAVENOUS | Status: AC | PRN
  Administered 2020-09-17: 6.6 mL via INTRAVENOUS

## 2020-09-17 NOTE — Discharge Instructions (Addendum)
1.  You have a very small amount of bleeding from your fall 4 days ago.  This appears stable since it is already 55 days old.  You may follow-up with the neurologist on outpatient basis.  A referral has been made to [facility].  Call on Monday to schedule your follow-up appointment. 2.  Make an appointment with your doctor for recheck.  Return to the emergency department if you develop a headache, confusion, problems with your vision or other concerning symptoms.

## 2020-09-17 NOTE — ED Triage Notes (Signed)
Pt reports LOC while in the bathroom on 9/15. He hit his head. No blood thinners. He does not remember the incident. He went to [facility] ED the next day but left due to wait time. He had labs drawn. Denies pain and states he feels totally normal at this time.

## 2020-09-17 NOTE — ED Provider Notes (Signed)
[facility] EMERGENCY DEPARTMENT Provider Note   CSN: [id number] Arrival date & time: 09/17/20  3299     History Chief Complaint  Patient presents with  . Loss of Consciousness    Ryan Lynn is a 86 y.o. male.  HPI Patient had an episode of passing out 4 days ago.  Called his family physician to get a follow-up appointment but was advised to come to the emergency department for evaluation first for possible head injury.  Patient reports that he had felt well on the day he passed out.  He had done all of his usual activities getting ready for bed.  He reports he was in the bathroom and this very suddenly lost consciousness and fell first he thinks onto his buttocks and then struck his head.  He reports that he immediately awakened to be aware of his surroundings.  His wife came in and checked on him.  He did not develop any severe headache, visual changes, focal weakness numbness or tingling.  After the event he felt back to normal.  He did go to [facility] long 2 days ago to try to get evaluation but he reports that the wait was so long and there were so many people in the emergency department with Covid that he decided to go home and try to follow-up with his physician.  Patient has been fully immunized for Covid.    Past Medical History:  Diagnosis Date  . Aortic sclerosis   . 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 Chest pressure   . Dysphagia   . ED (erectile dysfunction)   . Fatigue   . GERD (gastroesophageal reflux disease)   . Glaucoma, bilateral    "severe right; moderate left" (04/21/2015)  . Heart palpitations   . Pneumonia 1930's; 04/21/2015   hosp 04-25-15 asp pheumonia, c-diff  . PVC's (premature ventricular contractions)   . Rectal urgency     Patient Active Problem List   Diagnosis Date Noted  . Aspiration pneumonia ([place]) 04/25/2015  . Malnutrition of moderate degree ([place]) 04/22/2015  . Lung mass   . Pulmonary abscess ([place])   . HCAP (healthcare-associated  pneumonia)   . Pneumonia 04/21/2015  . Obstructive pneumonia 04/21/2015  . Lung abscess ([place]) 04/21/2015  . History of Clostridium difficile infection 04/21/2015  . Hoarseness of voice 10/15/2013  . BPH (benign prostatic hyperplasia) 10/15/2013  . Xerostomia 07/23/2012  . Fatigue 12/26/2011  . PVC's (premature ventricular contractions) 06/25/2011  . Benign hypertensive heart disease without heart failure 06/25/2011  . HEMORRHOIDS-EXTERNAL 09/26/2009  . FECAL INCONTINENCE 09/26/2009  . PERSONAL HX COLONIC POLYPS 09/26/2009  . GERD 07/14/2008  . Dysphagia, pharyngoesophageal phase 07/14/2008  . DIARRHEA 07/14/2008  . COLONIC POLYPS 07/13/2008  . HEMORRHOIDS 07/13/2008  . DIVERTICULOSIS, COLON 07/13/2008    Past Surgical History:  Procedure Laterality Date  . CARDIOVASCULAR STRESS TEST     NO ISCHEMIA  . ESOPHAGEAL DILATION N/A 10/18/2015   Procedure: ESOPHAGEAL DILATION;  Surgeon: Rozetta Nunnery, MD;  Location: [facility];  Service: ENT;  Laterality: N/A;  . GLAUCOMA SURGERY Bilateral "several"   "laser"  . INGUINAL HERNIA REPAIR Right 1999  . LARYNGOPLASTY N/A 10/18/2015   Procedure: VOCAL CORD MEDIALIZATION;  Surgeon: Rozetta Nunnery, MD;  Location: [facility];  Service: ENT;  Laterality: N/A;  . PAROTID GLAND TUMOR EXCISION  1988   radiation therapy  . THYROPLASTY N/A 10/18/2015   Procedure: THYROPLASTY;  Surgeon: Rozetta Nunnery, MD;  Location: [facility];  Service: ENT;  Laterality: N/A;  . TONSILLECTOMY  1943  . TRANSTHORACIC ECHOCARDIOGRAM  01/20/2010   NORMAL. EF 55-60%       Family History  Problem Relation Age of Onset  . Heart failure Mother   . Cancer Father     Social History   Tobacco Use  . Smoking status: Never Smoker  . Smokeless tobacco: Never Used  Vaping Use  . Vaping Use: Never used  Substance Use Topics  . Alcohol use: No  . Drug use: No    Home Medications Prior to Admission  medications   Medication Sig Start Date End Date Taking? Authorizing Provider  Calcium Carbonate Antacid (TUMS ULTRA 1000 PO) Take 1,000 mg by mouth as needed (for indigestion).     [provider]  cephALEXin (KEFLEX) 500 MG capsule Take 1 capsule (500 mg total) by mouth 2 (two) times daily. 10/18/15   Rozetta Nunnery, MD  dorzolamide (TRUSOPT) 2 % ophthalmic solution Place 1 drop into both eyes 2 (two) times daily.  06/02/12   [provider]  HYDROcodone-acetaminophen (NORCO/VICODIN) 5-325 MG tablet Take 1-2 tablets by mouth every 6 (six) hours as needed for moderate pain. 10/18/15   Rozetta Nunnery, MD  ibuprofen (ADVIL,MOTRIN) 200 MG tablet Take 200 mg by mouth every 6 (six) hours as needed for fever.    [provider]  latanoprost (XALATAN) 0.005 % ophthalmic solution Place 1 drop into both eyes daily.  09/29/13   [provider]  loratadine (CLARITIN) 10 MG tablet Take 10 mg by mouth daily.    [provider]  Multiple Vitamin (MULTIVITAMIN WITH MINERALS) TABS tablet Take 1 tablet by mouth daily.    [provider]  Probiotic Product (ALIGN) 4 MG CAPS Take 4 mg by mouth daily.     [provider]  simethicone (MYLICON) 951 MG chewable tablet Chew 125 mg by mouth as needed.     [provider]    Allergies    Cefdinir, Clindamycin/lincomycin, Doxazosin, Doxycycline hyclate, Esomeprazole magnesium, Levofloxacin, Simbrinza [brinzolamide-brimonidine], and Timolol  Review of Systems   Review of Systems 10 systems reviewed and negative except as per HPI Physical Exam Updated Vital Signs BP (!) 179/99   Pulse 80   Temp 97.6 F (36.4 C) (Oral)   Resp 18   Ht 5\' 11"  (1.803 m)   Wt 66.2 kg   SpO2 100%   BMI 20.36 kg/m   Physical Exam Constitutional:      Comments: Alert and nontoxic.  No respiratory distress.  HENT:     Head:     Comments: Patient has very old surgical changes of the face and neck on  the left.  He has a distant history of head and neck cancer.  He had extensive radiation therapy.  No acute findings.    Mouth/Throat:     Mouth: Mucous membranes are moist.     Pharynx: Oropharynx is clear.  Eyes:     Extraocular Movements: Extraocular movements intact.  Neck:     Comments: Except for surgical changes, no acute step-off or bony point tenderness. Cardiovascular:     Rate and Rhythm: Normal rate and regular rhythm.  Pulmonary:     Effort: Pulmonary effort is normal.     Breath sounds: Normal breath sounds.  Abdominal:     General: There is no distension.     Palpations: Abdomen is soft.     Tenderness: There is no  abdominal tenderness. There is no guarding.  Musculoskeletal:        General: No swelling or tenderness. Normal range of motion.     Cervical back: Neck supple.     Right lower leg: No edema.     Left lower leg: No edema.  Skin:    General: Skin is warm and dry.  Neurological:     General: No focal deficit present.     Mental Status: He is oriented to person, place, and time.     Motor: No weakness.     Coordination: Coordination normal.  Psychiatric:        Mood and Affect: Mood normal.     ED Results / Procedures / Treatments   Labs (all labs ordered are listed, but only abnormal results are displayed) Labs Reviewed  BASIC METABOLIC PANEL - Abnormal; Notable for the following components:      Result Value   Sodium 134 (*)    Chloride 97 (*)    All other components within normal limits  CBC - Abnormal; Notable for the following components:   RBC 4.16 (*)    Hemoglobin 12.9 (*)    All other components within normal limits  URINALYSIS, ROUTINE W REFLEX MICROSCOPIC - Abnormal; Notable for the following components:   APPearance CLOUDY (*)    All other components within normal limits  TROPONIN I (HIGH SENSITIVITY)    EKG EKG Interpretation  Date/Time:  Saturday September 17 2020 09:35:11 EDT Ventricular Rate:  96 PR Interval:  160 QRS  Duration: 84 QT Interval:  336 QTC Calculation: 424 R Axis:   89 Text Interpretation: Normal sinus rhythm Normal ECG no change from previous Confirmed by Charlesetta Shanks [phone]) on 09/17/2020 9:54:01 AM   Radiology CT Head Wo Contrast  Result Date: 09/17/2020 CLINICAL DATA:  86 year old male with a history of head trauma EXAM: CT HEAD WITHOUT CONTRAST TECHNIQUE: Contiguous axial images were obtained from the base of the skull through the vertex without intravenous contrast. COMPARISON:  PET-CT 02/09/2015, 11/11/2013 FINDINGS: Brain: There is a small hyperdense focus measuring approximately 6 mm within the ventral pons, to the left. This does not appear to have been present on the comparison PET CTs. There is also a vague hyperdense crescent within the inferior left temporal lobe, conforming to the sulcus and concerning for possible subarachnoid hemorrhage. No evidence of local mass effect. No midline shift. Gray-white differentiation is maintained. Mild volume loss. Vascular: Calcifications of the intracranial vasculature. Skull: No acute displaced fracture. Sinuses/Orbits: Left mastoid effusion. Trace mucosal disease of the left-sided ethmoid air cells. Other: None IMPRESSION: Vague hyperdensity conforming to left inferior temporal sulcus, concerning for subarachnoid hemorrhage. There is a second hyperdense focus within the ventral left pons. While a focal hemorrhage or cavernoma could have this appearance, the fact that this was not present on the comparison PET-CT, as well as the patient's given cancer history is more concerning, and referral for neurologic evaluation and contrast-enhanced MRI is recommended. Mild volume loss with intracranial atherosclerosis. Left mastoid effusion. These results were discussed by telephone telephone at the time of interpretation on 09/17/2020 at 11:58 am to provider [facility] , who verbally acknowledged these results. Electronically Signed   By: Corrie Mckusick D.O.    On: 09/17/2020 12:02   MR Brain W and Wo Contrast  Addendum Date: 09/17/2020   ADDENDUM REPORT: 09/17/2020 15:13 ADDENDUM: Study discussed by telephone with Dr. Roland Rack on 09/17/2020 at 1507 hours. We discussed that there are multifocal  chronic microhemorrhages but no evidence of acute bleeding in the brainstem. I think the second portion of the Impression should conclude with: " Follow-up HEAD-CT suggested to evaluate for evolution." Electronically Signed   By: Genevie Ann M.D.   On: 09/17/2020 15:13   Result Date: 09/17/2020 CLINICAL DATA:  Head trauma.  Abnormal CT. EXAM: MRI HEAD WITHOUT AND WITH CONTRAST TECHNIQUE: Multiplanar, multiecho pulse sequences of the brain and surrounding structures were obtained without and with intravenous contrast. CONTRAST:  6.82mL GADAVIST GADOBUTROL 1 MMOL/ML IV SOLN COMPARISON:  CT head 09/17/2020 FINDINGS: Brain: Hyperdensity left anterior pons corresponds an area of chronic microhemorrhage. Numerous additional areas of chronic microhemorrhage are present including the left thalamus, right basal ganglia, pons bilaterally and cerebellum bilaterally. Hyperdensity left posterior temporal lobe on CT is suspicious for acute hemorrhage. This shows increased signal on diffusion and FLAIR and may be parenchymal hemorrhage as opposed to subarachnoid hemorrhage. This is not show susceptibility on gradient echo imaging. Susceptibility weighted imaging not available. Generalized atrophy without hydrocephalus. Mild chronic microvascular ischemic change in the white matter and pons. No acute infarct or mass. No enhancing lesions postcontrast administration Vascular: Normal arterial flow voids. Skull and upper cervical spine: No focal skeletal lesion. Sinuses/Orbits: Mild mucosal edema left maxillary sinus. Mucosal edema mastoid sinus bilaterally. Left mastoid effusion. Negative orbit Other: None IMPRESSION: Hyperdensity left anterior pons on CT corresponds to an area of chronic  microhemorrhage. Multiple additional areas of chronic microhemorrhage are present. Correlate with history of hypertension. CT hyperdensity left posterior temporal lobe may represent acute hemorrhage from trauma. Follow-up PET-CT suggested to evaluate for evolution. Negative for acute infarct. Electronically Signed: By: Franchot Gallo M.D. On: 09/17/2020 14:45   DG Chest Port 1 View  Result Date: 09/17/2020 CLINICAL DATA:  Syncope EXAM: PORTABLE CHEST 1 VIEW COMPARISON:  05/24/2015 FINDINGS: The heart size and mediastinal contours are within normal limits. Both lungs are clear. The visualized skeletal structures are unremarkable. IMPRESSION: No acute abnormality of the lungs in AP portable projection Electronically Signed   By: Eddie Candle M.D.   On: 09/17/2020 11:45    Procedures Procedures (including critical care time)  Medications Ordered in ED Medications  gadobutrol (GADAVIST) 1 MMOL/ML injection 6.6 mL (6.6 mLs Intravenous Contrast Given 09/17/20 1415)    ED Course  I have reviewed the triage vital signs and the nursing notes.  Pertinent labs & imaging results that were available during my care of the patient were reviewed by me and considered in my medical decision making (see chart for details).    MDM Rules/Calculators/A&P                         Consult: Reviewed with Dr. Leonel Ramsay neurology.  He advises for MRI with and without contrast based on CT findings.  After reviewing CT, no acute findings requiring hospitalization.  Injury is 29 days old with very minor amount of bleeding and no signs of edema.  Recommendations for follow-up with neurology on outpatient basis.  Patient is alert and nontoxic.  He had a syncopal episode 4 days ago.  He has been otherwise asymptomatic.  No confusion, no focal neurologic deficits, no vomiting or headache.  Patient did not experience palpitation or chest pain.  At this time he is otherwise been stable.  CT shows small area concerning for  subarachnoid hemorrhage and other small pontine finding.  Commendations for follow-up MRI.  MRI results reviewed with Dr. Leonel Ramsay.  At this  time, patient is 4 days out from his fall and head injury.  Amount of subarachnoid is very small and stable at this time.  Patient would be appropriate for follow-up neurology on outpatient basis.  Other microhemorrhages chronic.  Patient is adamant that he does not want to be admitted to the hospital.  He reports he would rather die suddenly at home and risk getting Covid through contacts at the hospital.  Patient is alert with clear mental status.  No focal neurologic deficits.  Discussed the findings on CT and MR.  Plan is for close follow-up with neurology on outpatient basis.  Ambulatory referral made.  Return precautions reviewed. Final Clinical Impression(s) / ED Diagnoses Final diagnoses:  Syncope and collapse  Subarachnoid hemorrhage ([place])    Rx / DC Orders ED Discharge Orders         Ordered    Ambulatory referral to Neurology       Comments: An appointment is requested in approximately:5 days   09/17/20 1513           Charlesetta Shanks, MD 09/18/20 1050

## 2020-09-17 NOTE — ED Notes (Signed)
Patient transported to MRI 

## 2020-09-17 NOTE — ED Notes (Signed)
ED Provider at bedside. 

## 2020-09-20 ENCOUNTER — Encounter: Payer: Self-pay | Admitting: Neurology

## 2020-09-20 ENCOUNTER — Ambulatory Visit: Payer: Medicare Other | Admitting: Neurology

## 2020-09-20 ENCOUNTER — Other Ambulatory Visit: Payer: Self-pay

## 2020-09-20 ENCOUNTER — Telehealth: Payer: Self-pay | Admitting: Neurology

## 2020-09-20 VITALS — BP 128/75 | HR 95 | Ht 72.0 in | Wt 145.0 lb

## 2020-09-20 DIAGNOSIS — R55 Syncope and collapse: Secondary | ICD-10-CM

## 2020-09-20 DIAGNOSIS — R4020 Unspecified coma: Secondary | ICD-10-CM

## 2020-09-20 DIAGNOSIS — S060X9A Concussion with loss of consciousness of unspecified duration, initial encounter: Secondary | ICD-10-CM | POA: Diagnosis not present

## 2020-09-20 NOTE — Progress Notes (Signed)
[facility] [address] [place]. [place] [address] [phone]       OFFICE CONSULT NOTE  [name] Date of Birth:  November 09, 1934 Medical Record Number:  [id number]   Referring MD: Roma Kayser  Reason for Referral: Syncope  HPI: Ryan Lynn is a pleasant 86 year old Caucasian male seen today for initial office consultation visit.  Is accompanied by his wife and history is obtained from them as well as review of electronic medical records and I personally reviewed available pertinent imaging films in PACS.  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  He had an episode of passing out on 09/13/2020.  Patient states that he got up bed was fine and then all of a sudden he fell down he landed on the right side of his temple and back.  His wife was in the next room heard him and came.  He was moving around better.  To be slightly disoriented for a few minutes.  He appeared slightly confused as well but could speak all right.  There is no incontinence, tongue bite or tonic-clonic activity noted.  Patient did not seek help right away but presented to the ER 4 days later.  A CT scan of that was obtained which showed a hypodensity in the left ventral pons but no fracture was noted.  An MRI scan has been subsequently obtained which showed multiple microhemorrhages the left pons as well as thalamus and basal ganglia.  There was a area of diffusion positive within the left medial frontal lobe which was also seen bright on the FLAIR images raising possibility of resolving contusion versus subarachnoid hemorrhage which was felt to be more acute in that location.  Patient states is done well since then his not having any headaches, seizures or further episodes of loss of consciousness.  He denies prior history of syncope.  He denies any preceding palpitations, chest  pressure, sweating prior to his episode of loss of consciousness.  He has not had any prior strokes, TIAs, seizures, significant head injury with loss of consciousness and syncopal events.  He does have a history of radiation to his neck for thyroid cancer 20 years ago but has not been evaluated for any significant occlusive vascular disease.  ROS:   14 system review of systems is positive for dysphagia, weight loss, change in voice, loss of consciousness, headache, confusion all other systems negative  PMH:  Past Medical History:  Diagnosis Date  . Aortic sclerosis   . 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 Chest pressure   . Dysphagia   . ED (erectile dysfunction)   . Fatigue   . GERD (gastroesophageal reflux disease)   . Glaucoma, bilateral    "severe right; moderate left" (04/21/2015)  . Heart palpitations   . Pneumonia 1930's; 04/21/2015   hosp 04-25-15 asp pheumonia, c-diff  . PVC's (premature ventricular contractions)   . Rectal urgency     Social History:  Social History   Socioeconomic History  . Marital status: Married    Spouse name: Not on file  . Number of children: 2  . Years of education: Not on file  . Highest education level: Not on file  Occupational History  . Occupation: Retired  Tobacco Use  . Smoking status: Never Smoker  . Smokeless tobacco: Never Used  Vaping Use  . Vaping Use: Never used  Substance and Sexual Activity  .  Alcohol use: No  . Drug use: No  . Sexual activity: Never  Other Topics Concern  . Not on file  Social History Narrative  . Not on file   Social Determinants of Health   Financial Resource Strain:   . Difficulty of Paying Living Expenses: Not on file  Food Insecurity:   . Worried About Charity fundraiser in the Last Year: Not on file  . Ran Out of Food in the Last Year: Not on file  Transportation Needs:   . Lack of Transportation (Medical): Not on file  . Lack of Transportation (Non-Medical): Not on file  Physical  Activity:   . Days of Exercise per Week: Not on file  . Minutes of Exercise per Session: Not on file  Stress:   . Feeling of Stress : Not on file  Social Connections:   . Frequency of Communication with Friends and Family: Not on file  . Frequency of Social Gatherings with Friends and Family: Not on file  . Attends Religious Services: Not on file  . Active Member of Clubs or Organizations: Not on file  . Attends Archivist Meetings: Not on file  . Marital Status: Not on file  Intimate Partner Violence:   . Fear of Current or Ex-Partner: Not on file  . Emotionally Abused: Not on file  . Physically Abused: Not on file  . Sexually Abused: Not on file    Medications:   Current Outpatient Medications on File Prior to Visit  Medication Sig Dispense Refill  . chlorhexidine (PERIDEX) 0.12 % solution SMARTSIG:1 Capful(s) By Mouth Twice Daily    . Cholecalciferol (VITAMIN D3) 50 MCG (2000 UT) capsule Take 2,000 Units by mouth daily.    . dorzolamide (TRUSOPT) 2 % ophthalmic solution Place 1 drop into both eyes 2 (two) times daily.     Marland Kitchen latanoprost (XALATAN) 0.005 % ophthalmic solution Place 1 drop into both eyes daily.     . Multiple Vitamin (MULTIVITAMIN WITH MINERALS) TABS tablet Take 1 tablet by mouth daily.    Marland Kitchen omeprazole (PRILOSEC) 20 MG capsule Take by mouth.    . Probiotic Product (ALIGN) 4 MG CAPS Take 4 mg by mouth daily.     . Sodium Fluoride (ACT FLUORIDE MT) Use as directed in the mouth or throat.    . sodium fluoride (FLUORISHIELD) 1.1 % GEL dental gel Place 1 application onto teeth at bedtime.     No current facility-administered medications on file prior to visit.    Allergies:   Allergies  Allergen Reactions  . Cefdinir Diarrhea  . Clindamycin/Lincomycin Diarrhea and Other (See Comments)    c diff  . Doxazosin     Low BP and light-headedness  . Doxycycline Hyclate     Dizziness and upset stomach  . Esomeprazole Magnesium Diarrhea  . Levofloxacin Other  (See Comments)    c diff  . Simbrinza [Brinzolamide-Brimonidine]     Low BP and light-headedness, fatigue  . Timolol     Causes low  BP and light-headedness    Physical Exam General: Frail malnourished looking elderly Caucasian male, seated, in no evident distress Head: head normocephalic and atraumatic.   Neck: supple with no carotid or supraclavicular bruits.  He has severe wasting in his neck and lower facial muscles as a result of radiation for thyroid cancer Cardiovascular: regular rate and rhythm, no murmurs Musculoskeletal: Mild kyphoscoliosis  skin:  no rash/petichiae Vascular:  Normal pulses all extremities  Neurologic Exam Mental Status:  Awake and fully alert. Oriented to place and time. Recent and remote memory intact. Attention span, concentration and fund of knowledge appropriate. Mood and affect appropriate.  Voice is hypophonic and graph and difficult to understand at times  Cranial Nerves: Fundoscopic exam reveals sharp disc margins. Pupils equal, briskly reactive to light. Extraocular movements full without nystagmus. Visual fields full to confrontation. Hearing diminished bilaterally.  Mild bifacial weakness with significant wasting and weakness of lower face left more than right as well as neck muscles.  Facial sensation intact. Face, tongue, palate moves normally and symmetrically.  Motor: Normal bulk and tone. Normal strength in all tested extremity muscles. Sensory.: intact to touch , pinprick , position and vibratory sensation.  Coordination: Rapid alternating movements normal in all extremities. Finger-to-nose and heel-to-shin performed accurately bilaterally. Gait and Station: Arises from chair without difficulty. Stance is normal. Gait demonstrates normal stride length and balance . Able to heel, toe and tandem walk with moderate difficulty.  Reflexes: 1+ and symmetric. Toes downgoing.       ASSESSMENT: 86 year old Caucasian male with episode of brief loss of  consciousness of unclear etiology possibly syncope versus seizure resulting in with mild left temporal contusion .     PLAN: I had a long discussion with the patient and his wife regarding his episode of brief loss of consciousness possibly representing syncope versus TIA doubt seizure.  I reviewed CT scan and MRI findings with them and feels a tiny left anterior temporal blood is likely concussion resulting from his fall and his not clinically significant.  I recommend checking CT angiogram of the brain and neck to look for occlusive extracranial vascular disease as a result of radiation out.  But he from his remote radiation for thyroid cancer.  Check EEG for seizure activity.  Check lipid profile and hemoglobin A1c.  Patient was advised not to drive as per [facility].  Greater than 50% time during this 45-minute consultation visit was spent on counseling and coordination of care about his episode of loss of consciousness and confusion and answering questions he will return for follow-up in 2 months or call earlier if necessary. Antony Contras, MD  [facility] [address] [place], [place] [address]  Phone [phone] Fax [phone] Note: This document was prepared with digital dictation and possible smart phrase technology. Any transcriptional errors that result from this process are unintentional.

## 2020-09-20 NOTE — Telephone Encounter (Signed)
[employer] medicare order sent to GI. No auth they will reach out to the patient to schedule.  

## 2020-09-20 NOTE — Patient Instructions (Signed)
I had a long discussion with the patient and his wife regarding his episode of brief loss of consciousness possibly representing syncope versus TIA doubt seizure.  I reviewed CT scan and MRI findings with them and feels a tiny left anterior temporal blood is likely concussion resulting from his fall and his not clinically significant.  I recommend checking CT angiogram of the brain and neck to look for occlusive extracranial vascular disease as a result of radiation out.  But he from his remote radiation for thyroid cancer.  Check EEG for seizure activity.  Check lipid profile and hemoglobin A1c.  Patient was advised not to drive as per [facility].  He will return for follow-up in 2 months or call earlier if necessary.

## 2020-10-03 ENCOUNTER — Ambulatory Visit
Admission: RE | Admit: 2020-10-03 | Discharge: 2020-10-03 | Disposition: A | Discharge status: Home / Self Care | Payer: Medicare Other | Source: Ambulatory Visit | Attending: Neurology | Admitting: Neurology

## 2020-10-03 ENCOUNTER — Other Ambulatory Visit: Payer: Self-pay

## 2020-10-03 DIAGNOSIS — R55 Syncope and collapse: Secondary | ICD-10-CM

## 2020-10-03 MED ORDER — IOPAMIDOL (ISOVUE-370) INJECTION 76%
75.0000 mL | Freq: Once | INTRAVENOUS | Status: AC | PRN
  Administered 2020-10-03: 75 mL via INTRAVENOUS

## 2020-10-03 NOTE — Progress Notes (Signed)
Kindly inform the patient that CT angiogram study of the brain and neck both do not show any major narrowing or blockages of the blood vessels in the neck of the brain but show only mild age-related narrowing and irregularities.  Nothing to worry about

## 2020-10-04 ENCOUNTER — Telehealth: Payer: Self-pay | Admitting: Emergency Medicine

## 2020-10-04 ENCOUNTER — Other Ambulatory Visit: Payer: Medicare Other | Admitting: Neurology

## 2020-10-04 DIAGNOSIS — R55 Syncope and collapse: Secondary | ICD-10-CM | POA: Diagnosis not present

## 2020-10-04 NOTE — Telephone Encounter (Signed)
-----   Message from Garvin Fila, MD sent at 10/03/2020  5:39 PM EDT ----- Mitchell Heir inform the patient that CT angiogram study of the brain and neck both do not show any major narrowing or blockages of the blood vessels in the neck of the brain but show only mild age-related narrowing and irregularities.  Nothing to worry about

## 2020-10-04 NOTE — Telephone Encounter (Signed)
Attempted to call to give results of CT angiogram, no answer.  Patient does not have a DPR on file.  Will try again later.

## 2020-10-04 NOTE — Telephone Encounter (Signed)
Called and spoke to patient regarding results of CT Angiogram.  Patient handed phone to his wife, (judy), discussed Dr. Clydene Fake findings.  She denied any quesitons and expressed appreciation.

## 2020-10-09 NOTE — Progress Notes (Signed)
Kindly inform the patient that EEG study was normal

## 2020-10-10 ENCOUNTER — Telehealth: Payer: Self-pay | Admitting: Emergency Medicine

## 2020-10-10 NOTE — Telephone Encounter (Signed)
Called and spoke to patient regarding Dr. Clydene Fake findings of EEG.  Patient was on phone and put wife [name] on the phone.  Explained the EEG was normal.  Both patient and wife had a multitude of questions regarding why the patient passed out then.  Explained to them It could be a variety of reasons and I will discuss with Dr. Leonie Man.

## 2020-10-15 ENCOUNTER — Ambulatory Visit: Payer: Medicare Other | Attending: Internal Medicine

## 2020-10-15 DIAGNOSIS — Z23 Encounter for immunization: Secondary | ICD-10-CM

## 2020-10-15 NOTE — Progress Notes (Signed)
   Covid-19 Vaccination Clinic  Name:  Ryan Lynn    MRN: [id number] DOB: 08/12/1934  10/15/2020  Ryan Lynn was observed post Covid-19 immunization for 15 minutes without incident. He was provided with Vaccine Information Sheet and instruction to access the V-Safe system.   Ryan Lynn was instructed to call 911 with any severe reactions post vaccine: Marland Kitchen Difficulty breathing  . Swelling of face and throat  . A fast heartbeat  . A bad rash all over body  . Dizziness and weakness

## 2020-10-19 NOTE — Telephone Encounter (Signed)
I called the patient and spoke to him and his wife and explained the results of EEG and CT angiograms and he likely had a syncopal event in many such cases we unable to find an exact pinpoint cause.  I recommended he keep himself well-hydrated and feel the likelihood of this happening again should be quite slim.  He voiced understanding.

## 2020-11-28 ENCOUNTER — Ambulatory Visit: Payer: Medicare Other | Admitting: Neurology

## 2020-11-28 ENCOUNTER — Encounter: Payer: Self-pay | Admitting: Neurology

## 2020-11-28 ENCOUNTER — Other Ambulatory Visit: Payer: Self-pay

## 2020-11-28 VITALS — BP 169/92 | HR 91 | Ht 72.0 in | Wt 144.8 lb

## 2020-11-28 DIAGNOSIS — S062X1D Diffuse traumatic brain injury with loss of consciousness of 30 minutes or less, subsequent encounter: Secondary | ICD-10-CM | POA: Diagnosis not present

## 2020-11-28 DIAGNOSIS — R55 Syncope and collapse: Secondary | ICD-10-CM | POA: Diagnosis not present

## 2020-11-28 NOTE — Progress Notes (Signed)
[facility] [address] [place]. [place] [address] [phone]       OFFICE FOLLOW UP VISIT NOTE  [name] Date of Birth:  1934/03/04 Medical Record Number:  [id number]   Referring MD: Roma Kayser  Reason for Referral: Syncope  HPI: Initial consult 09/20/2020 Ryan Lynn is a pleasant 86 year old Caucasian male seen today for initial office consultation visit.  Is accompanied by his wife and history is obtained from them as well as review of electronic medical records and I personally reviewed available pertinent imaging films in PACS.  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  He had an episode of passing out on 09/13/2020.  Patient states that he got up bed was fine and then all of a sudden he fell down he landed on the right side of his temple and back.  His wife was in the next room heard him and came.  He was moving around better.  To be slightly disoriented for a few minutes.  He appeared slightly confused as well but could speak all right.  There is no incontinence, tongue bite or tonic-clonic activity noted.  Patient did not seek help right away but presented to the ER 4 days later.  A CT scan of that was obtained which showed a hypodensity in the left ventral pons but no fracture was noted.  An MRI scan has been subsequently obtained which showed multiple microhemorrhages the left pons as well as thalamus and basal ganglia.  There was a area of diffusion positive within the left medial frontal lobe which was also seen bright on the FLAIR images raising possibility of resolving contusion versus subarachnoid hemorrhage which was felt to be more acute in that location.  Patient states is done well since then his not having any headaches, seizures or further episodes of loss of consciousness.  He denies prior history of syncope.  He denies  any preceding palpitations, chest pressure, sweating prior to his episode of loss of consciousness.  He has not had any prior strokes, TIAs, seizures, significant head injury with loss of consciousness and syncopal events.  He does have a history of radiation to his neck for thyroid cancer 20 years ago but has not been evaluated for any significant occlusive vascular disease. Update 11/28/2020: He returns for follow-up after last visit 2 months ago.  Is accompanied by his wife.  He states is doing well.  He has had no further episodes of loss of consciousness.  He underwent EEG on 10/06/2020 which was normal.  CT angiogram of brain and neck done on 10/03/2020 showed no significant extracranial or intracranial stenosis.  The previously seen left medial temporal and brainstem Heparin density from contusion is no longer appreciated.  Patient states is doing fine is had no complaints.  His blood pressure is usually well controlled today it is elevated at 169/92 in office. ROS:   14 system review of systems is positive for unwitnessed fall, dysphagia, weight loss, change in voice, loss of consciousness, headache, confusion all other systems negative  PMH:  Past Medical History:  Diagnosis Date  . Aortic sclerosis   . 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 Chest pressure   . Dysphagia   . ED (erectile dysfunction)   . Fatigue   . GERD (gastroesophageal reflux disease)   . Glaucoma, bilateral    "severe right; moderate left" (04/21/2015)  .  Heart palpitations   . Pneumonia 1930's; 04/21/2015   hosp 04-25-15 asp pheumonia, c-diff  . PVC's (premature ventricular contractions)   . Rectal urgency     Social History:  Social History   Socioeconomic History  . Marital status: Married    Spouse name: [name]  . Number of children: 2  . Years of education: Not on file  . Highest education level: Not on file  Occupational History  . Occupation: Retired  Tobacco Use  . Smoking status: Never Smoker  .  Smokeless tobacco: Never Used  Vaping Use  . Vaping Use: Never used  Substance and Sexual Activity  . Alcohol use: No  . Drug use: No  . Sexual activity: Never  Other Topics Concern  . Not on file  Social History Narrative   Lives with wife   Right handed   Drinks no caffeine   Social Determinants of Health   Financial Resource Strain:   . Difficulty of Paying Living Expenses: Not on file  Food Insecurity:   . Worried About Charity fundraiser in the Last Year: Not on file  . Ran Out of Food in the Last Year: Not on file  Transportation Needs:   . Lack of Transportation (Medical): Not on file  . Lack of Transportation (Non-Medical): Not on file  Physical Activity:   . Days of Exercise per Week: Not on file  . Minutes of Exercise per Session: Not on file  Stress:   . Feeling of Stress : Not on file  Social Connections:   . Frequency of Communication with Friends and Family: Not on file  . Frequency of Social Gatherings with Friends and Family: Not on file  . Attends Religious Services: Not on file  . Active Member of Clubs or Organizations: Not on file  . Attends Archivist Meetings: Not on file  . Marital Status: Not on file  Intimate Partner Violence:   . Fear of Current or Ex-Partner: Not on file  . Emotionally Abused: Not on file  . Physically Abused: Not on file  . Sexually Abused: Not on file    Medications:   Current Outpatient Medications on File Prior to Visit  Medication Sig Dispense Refill  . chlorhexidine (PERIDEX) 0.12 % solution SMARTSIG:1 Capful(s) By Mouth Twice Daily    . Cholecalciferol (VITAMIN D3) 50 MCG (2000 UT) capsule Take 2,000 Units by mouth daily.    . dorzolamide (TRUSOPT) 2 % ophthalmic solution Place 1 drop into both eyes 2 (two) times daily.     . food thickener (RESOURCE THICKENUP CLEAR) POWD Take by mouth as needed.    . latanoprost (XALATAN) 0.005 % ophthalmic solution Place 1 drop into both eyes daily.     . Multiple  Vitamin (MULTIVITAMIN WITH MINERALS) TABS tablet Take 1 tablet by mouth daily.    Marland Kitchen omeprazole (PRILOSEC) 20 MG capsule Take by mouth.    . Probiotic Product (ALIGN) 4 MG CAPS Take 4 mg by mouth daily.     . Sodium Fluoride (ACT FLUORIDE MT) Use as directed in the mouth or throat.    . sodium fluoride (FLUORISHIELD) 1.1 % GEL dental gel Place 1 application onto teeth at bedtime.     No current facility-administered medications on file prior to visit.    Allergies:   Allergies  Allergen Reactions  . Cefdinir Diarrhea  . Clindamycin/Lincomycin Diarrhea and Other (See Comments)    c diff  . Doxazosin     Low BP  and light-headedness  . Doxycycline Hyclate     Dizziness and upset stomach  . Esomeprazole Magnesium Diarrhea  . Levofloxacin Other (See Comments)    c diff  . Simbrinza [Brinzolamide-Brimonidine]     Low BP and light-headedness, fatigue  . Timolol     Causes low  BP and light-headedness    Physical Exam General: Frail malnourished looking elderly Caucasian male, seated, in no evident distress Head: head normocephalic and atraumatic.   Neck: supple with no carotid or supraclavicular bruits.  He has severe wasting in his neck and lower facial muscles as a result of radiation for thyroid cancer Cardiovascular: regular rate and rhythm, no murmurs Musculoskeletal: Mild kyphoscoliosis  skin:  no rash/petichiae Vascular:  Normal pulses all extremities  Neurologic Exam Mental Status: Awake and fully alert. Oriented to place and time. Recent and remote memory intact. Attention span, concentration and fund of knowledge appropriate. Mood and affect appropriate.  Voice is hypophonic , soft and difficult to understand at times  Cranial Nerves: Fundoscopic exam not done. Pupils equal, briskly reactive to light. Extraocular movements full without nystagmus. Visual fields full to confrontation. Hearing diminished bilaterally.  Mild bifacial weakness with significant wasting and weakness  of lower face left more than right as well as neck muscles.  Facial sensation intact. Face, tongue, palate moves normally and symmetrically.  Motor: Normal bulk and tone. Normal strength in all tested extremity muscles. Sensory.: intact to touch , pinprick , position and vibratory sensation.  Coordination: Rapid alternating movements normal in all extremities. Finger-to-nose and heel-to-shin performed accurately bilaterally. Gait and Station: Arises from chair without difficulty. Stance is normal. Gait demonstrates normal stride length and balance . Able to heel, toe and tandem walk with moderate difficulty.  Reflexes: 1+ and symmetric. Toes downgoing.       ASSESSMENT: 86 year old Caucasian male with episode of brief loss of consciousness of unclear etiology possibly syncope versus seizure resulting in with mild left temporal contusion .     PLAN: I had a long discussion with the patient and his wife regarding results of his follow-up brain imaging studies and EEG and answered questions.  I recommend he do not drive for 6 months since his episode as per [facility].  He will return for follow-up in the future only as necessary and no schedule appointment was made. Greater than 50% time during this 25-minute consultation visit was spent on counseling and coordination of care about his episode of loss of consciousness and confusion and answering questions.Antony Contras, MD  [facility] [address] [place], [place] [address]  Phone [phone] Fax [phone] Note: This document was prepared with digital dictation and possible smart phrase technology. Any transcriptional errors that result from this process are unintentional.

## 2020-11-28 NOTE — Patient Instructions (Signed)
I had a long discussion with the patient and his wife regarding results of his follow-up brain imaging studies and EEG and answered questions.  I recommend he do not drive for 6 months since his episode as per [facility].  He will return for follow-up in the future only as necessary and no schedule appointment was made.

## 2021-01-25 ENCOUNTER — Inpatient Hospital Stay (HOSPITAL_COMMUNITY)
Admission: EM | Admit: 2021-01-25 | Discharge: 2021-02-08 | DRG: 082 | Disposition: A | Discharge status: Skilled Nursing Facility | Payer: Medicare Other | Attending: Internal Medicine | Admitting: Internal Medicine

## 2021-01-25 ENCOUNTER — Emergency Department (HOSPITAL_COMMUNITY): Payer: Medicare Other

## 2021-01-25 DIAGNOSIS — I609 Nontraumatic subarachnoid hemorrhage, unspecified: Secondary | ICD-10-CM

## 2021-01-25 DIAGNOSIS — I480 Paroxysmal atrial fibrillation: Secondary | ICD-10-CM | POA: Diagnosis present

## 2021-01-25 DIAGNOSIS — Z85818 Personal history of malignant neoplasm of other sites of lip, oral cavity, and pharynx: Secondary | ICD-10-CM

## 2021-01-25 DIAGNOSIS — Z8701 Personal history of pneumonia (recurrent): Secondary | ICD-10-CM

## 2021-01-25 DIAGNOSIS — S065XAA Traumatic subdural hemorrhage with loss of consciousness status unknown, initial encounter: Secondary | ICD-10-CM | POA: Diagnosis present

## 2021-01-25 DIAGNOSIS — I472 Ventricular tachycardia: Secondary | ICD-10-CM | POA: Diagnosis present

## 2021-01-25 DIAGNOSIS — J690 Pneumonitis due to inhalation of food and vomit: Secondary | ICD-10-CM | POA: Diagnosis not present

## 2021-01-25 DIAGNOSIS — R0602 Shortness of breath: Secondary | ICD-10-CM

## 2021-01-25 DIAGNOSIS — R64 Cachexia: Secondary | ICD-10-CM | POA: Diagnosis present

## 2021-01-25 DIAGNOSIS — Z20822 Contact with and (suspected) exposure to covid-19: Secondary | ICD-10-CM | POA: Diagnosis present

## 2021-01-25 DIAGNOSIS — S066X9A Traumatic subarachnoid hemorrhage with loss of consciousness of unspecified duration, initial encounter: Principal | ICD-10-CM | POA: Diagnosis present

## 2021-01-25 DIAGNOSIS — R296 Repeated falls: Secondary | ICD-10-CM | POA: Diagnosis present

## 2021-01-25 DIAGNOSIS — S065X9A Traumatic subdural hemorrhage with loss of consciousness of unspecified duration, initial encounter: Secondary | ICD-10-CM | POA: Diagnosis present

## 2021-01-25 DIAGNOSIS — I4891 Unspecified atrial fibrillation: Secondary | ICD-10-CM | POA: Diagnosis present

## 2021-01-25 DIAGNOSIS — Z66 Do not resuscitate: Secondary | ICD-10-CM | POA: Diagnosis present

## 2021-01-25 DIAGNOSIS — I110 Hypertensive heart disease with heart failure: Secondary | ICD-10-CM | POA: Diagnosis present

## 2021-01-25 DIAGNOSIS — R1313 Dysphagia, pharyngeal phase: Secondary | ICD-10-CM | POA: Diagnosis present

## 2021-01-25 DIAGNOSIS — Z681 Body mass index (BMI) 19 or less, adult: Secondary | ICD-10-CM

## 2021-01-25 DIAGNOSIS — R0902 Hypoxemia: Secondary | ICD-10-CM | POA: Diagnosis not present

## 2021-01-25 DIAGNOSIS — N139 Obstructive and reflux uropathy, unspecified: Secondary | ICD-10-CM | POA: Diagnosis present

## 2021-01-25 DIAGNOSIS — W109XXA Fall (on) (from) unspecified stairs and steps, initial encounter: Secondary | ICD-10-CM | POA: Diagnosis present

## 2021-01-25 DIAGNOSIS — W19XXXA Unspecified fall, initial encounter: Secondary | ICD-10-CM | POA: Diagnosis present

## 2021-01-25 DIAGNOSIS — Z923 Personal history of irradiation: Secondary | ICD-10-CM

## 2021-01-25 DIAGNOSIS — R55 Syncope and collapse: Secondary | ICD-10-CM | POA: Diagnosis present

## 2021-01-25 DIAGNOSIS — R627 Adult failure to thrive: Secondary | ICD-10-CM | POA: Diagnosis present

## 2021-01-25 DIAGNOSIS — I77810 Thoracic aortic ectasia: Secondary | ICD-10-CM | POA: Diagnosis present

## 2021-01-25 DIAGNOSIS — I499 Cardiac arrhythmia, unspecified: Secondary | ICD-10-CM | POA: Diagnosis present

## 2021-01-25 DIAGNOSIS — K219 Gastro-esophageal reflux disease without esophagitis: Secondary | ICD-10-CM | POA: Diagnosis present

## 2021-01-25 DIAGNOSIS — I5020 Unspecified systolic (congestive) heart failure: Secondary | ICD-10-CM | POA: Diagnosis present

## 2021-01-25 DIAGNOSIS — H409 Unspecified glaucoma: Secondary | ICD-10-CM | POA: Diagnosis present

## 2021-01-25 DIAGNOSIS — S0101XA Laceration without foreign body of scalp, initial encounter: Secondary | ICD-10-CM | POA: Diagnosis present

## 2021-01-25 DIAGNOSIS — T148XXA Other injury of unspecified body region, initial encounter: Secondary | ICD-10-CM

## 2021-01-25 DIAGNOSIS — I493 Ventricular premature depolarization: Secondary | ICD-10-CM | POA: Diagnosis present

## 2021-01-25 DIAGNOSIS — I160 Hypertensive urgency: Secondary | ICD-10-CM | POA: Diagnosis present

## 2021-01-25 DIAGNOSIS — I4729 Other ventricular tachycardia: Secondary | ICD-10-CM

## 2021-01-25 DIAGNOSIS — I429 Cardiomyopathy, unspecified: Secondary | ICD-10-CM | POA: Diagnosis present

## 2021-01-25 DIAGNOSIS — E43 Unspecified severe protein-calorie malnutrition: Secondary | ICD-10-CM | POA: Insufficient documentation

## 2021-01-25 DIAGNOSIS — Z9181 History of falling: Secondary | ICD-10-CM

## 2021-01-25 DIAGNOSIS — S06360A Traumatic hemorrhage of cerebrum, unspecified, without loss of consciousness, initial encounter: Secondary | ICD-10-CM

## 2021-01-25 DIAGNOSIS — Z515 Encounter for palliative care: Secondary | ICD-10-CM

## 2021-01-25 MED ORDER — ONDANSETRON HCL 4 MG/2ML IJ SOLN
INTRAMUSCULAR | Status: AC
  Administered 2021-01-25: 4 mg via INTRAVENOUS
  Filled 2021-01-25: qty 2

## 2021-01-25 MED ORDER — ONDANSETRON HCL 4 MG/2ML IJ SOLN: 4.0000 mg | Freq: Once | INTRAMUSCULAR | Status: AC

## 2021-01-25 MED ORDER — SODIUM CHLORIDE 0.9 % IV BOLUS
1000.0000 mL | Freq: Once | INTRAVENOUS | Status: AC
  Administered 2021-01-25: 1000 mL via INTRAVENOUS

## 2021-01-25 MED ORDER — SODIUM CHLORIDE 0.9 % IV BOLUS: 125.0000 mL | Freq: Once | INTRAVENOUS | Status: DC

## 2021-01-25 NOTE — ED Notes (Signed)
Patient transported to CT 

## 2021-01-25 NOTE — Progress Notes (Signed)
Orthopedic Tech Progress Note Patient Details:  Ryan Lynn 05-20-1934 [id number] Level 2 Trauma  Patient ID: [name], male   DOB: Apr 17, 1934, 87 y.o.   MRN: [id number]   [name] 01/25/2021, 11:56 PM

## 2021-01-25 NOTE — ED Provider Notes (Signed)
[facility]  CSN: [id number] Arrival date & time: 01/25/21 2330  Chief Complaint(s) Fall (Down 6-8 steps )  HPI Ryan Lynn is a 87 y.o. male here as a Level II trauma for fall down stairs with head injury. Initially A&Ox4, but developed amnesia to event in route with EMS. Was noted to be hypertensive and tachycardic. Sustained scalp laceration and abrasions. No obvious deformities.  Patient complains of nausea only.  No headache, neck pain, back pain, chest pain, abd pain, or extremity pain.  He is able to answer PMH questions, but still does not remember event surrounding the fall.  Remainder of history, ROS, and physical exam limited due to patient's condition (AMS). Additional information was obtained from EMS.   Level V Caveat.    HPI  Past Medical History No past medical history on file. Patient Active Problem List   Diagnosis Date Noted  . SAH (subarachnoid hemorrhage) (HCC) 01/26/2021   Home Medication(s) Prior to Admission medications   Not on File                                                                                                                                    Past Surgical History  The histories are not reviewed yet. Please review them in the "History" navigator section and refresh this SmartLink. Family History No family history on file.  Social History   Allergies Patient has no allergy information on record.  Review of Systems Review of Systems All other systems are reviewed and are negative for acute change except as noted in the HPI   Physical Exam Vital Signs  I have reviewed the triage vital signs BP (!) 164/96   Pulse (!) 107   Temp (!) 96.9 F (36.1 C) (Temporal)   Resp 19   Ht 6' (1.829 m)   Wt 69.4 kg   SpO2 95%   BMI 20.75 kg/m   Physical Exam Constitutional:      General: He is not in acute distress.    Appearance: He is well-developed and well-nourished. He  is not diaphoretic.     Interventions: Cervical collar in place.  HENT:     Head: Normocephalic. Laceration present.      Right Ear: External ear normal.     Left Ear: External ear normal.     Mouth/Throat:     Mouth: Oropharynx is clear and moist.  Eyes:     General: No scleral icterus.       Right eye: No discharge.        Left eye: No discharge.     Extraocular Movements: EOM normal.     Conjunctiva/sclera: Conjunctivae normal.     Pupils: Pupils are equal, round, and reactive to light.  Cardiovascular:     Rate and Rhythm: Regular rhythm.     Pulses:          Radial  pulses are 2+ on the right side and 2+ on the left side.       Dorsalis pedis pulses are 2+ on the right side and 2+ on the left side.     Heart sounds: Normal heart sounds. No murmur heard. No friction rub. No gallop.   Pulmonary:     Effort: Pulmonary effort is normal. No respiratory distress.     Breath sounds: Normal breath sounds. No stridor.  Abdominal:     General: There is no distension.     Palpations: Abdomen is soft.     Tenderness: There is no abdominal tenderness.  Musculoskeletal:     Cervical back: Normal range of motion and neck supple. No bony tenderness.     Thoracic back: No bony tenderness.     Lumbar back: No bony tenderness.     Comments: Clavicle stable. Chest stable to AP/Lat compression. Pelvis stable to Lat compression. No obvious extremity deformity. No chest or abdominal wall contusion.  Skin:    General: Skin is warm.     Findings: Abrasion present.  Neurological:     Mental Status: He is alert and oriented to person, place, and time.     GCS: GCS eye subscore is 4. GCS verbal subscore is 5. GCS motor subscore is 6.     Comments: Moving all extremities      ED Results and Treatments Labs (all labs ordered are listed, but only abnormal results are displayed) Labs Reviewed  COMPREHENSIVE METABOLIC PANEL - Abnormal; Notable for the following components:      Result Value    Sodium 133 (*)    Chloride 97 (*)    Glucose, Bld 123 (*)    All other components within normal limits  URINALYSIS, ROUTINE W REFLEX MICROSCOPIC - Abnormal; Notable for the following components:   Ketones, ur 5 (*)    All other components within normal limits  CBC WITH DIFFERENTIAL/PLATELET - Abnormal; Notable for the following components:   WBC 13.4 (*)    Neutro Abs 10.6 (*)    Abs Immature Granulocytes 0.14 (*)    All other components within normal limits  BRAIN NATRIURETIC PEPTIDE - Abnormal; Notable for the following components:   B Natriuretic Peptide 171.8 (*)    All other components within normal limits  I-STAT CHEM 8, ED - Abnormal; Notable for the following components:   Sodium 132 (*)    Glucose, Bld 119 (*)    Calcium, Ion 1.02 (*)    All other components within normal limits  SARS CORONAVIRUS 2 BY RT PCR (HOSPITAL ORDER, [facility])  LACTIC ACID, PLASMA  PROTIME-INR  MAGNESIUM  CBC  BASIC METABOLIC PANEL  MAGNESIUM  PHOSPHORUS  TROPONIN I (HIGH SENSITIVITY)  TROPONIN I (HIGH SENSITIVITY)                                                                                                                         EKG  EKG Interpretation  Date/Time:  Wednesday January 25 2021 23:40:12 EST Ventricular Rate:  122 PR Interval:    QRS Duration: 92 QT Interval:  346 QTC Calculation: 431 R Axis:   87 Text Interpretation: Atrial fibrillation Ventricular tachycardia, unsustained Borderline right axis deviation Confirmed by Addison Lank [phone]) on 01/25/2021 11:48:00 PM      Radiology CT HEAD WO CONTRAST  Result Date: 01/26/2021 CLINICAL DATA:  Fall, head injury, neck injury, chronic anticoagulation EXAM: CT HEAD WITHOUT CONTRAST CT CERVICAL SPINE WITHOUT CONTRAST TECHNIQUE: Multidetector CT imaging of the head and cervical spine was performed following the standard protocol without intravenous contrast. Multiplanar CT image reconstructions of  the cervical spine were also generated. COMPARISON:  MRI head 09/17/2020, CT neck 04/01/2017 FINDINGS: CT HEAD FINDINGS Brain: There is normal anatomic configuration of the brain. A small right subdural hematoma is seen along the right parietal convexity measuring 8-9 mm in thickness on coronal image # 47 and demonstrating mild mass effect upon the right cerebral hemisphere. There is no associated midline shift. Additionally, there is a small focus of subarachnoid hemorrhage seen within the sulcus of the hippocampal gyrus, best noted on coronal image # 43. No acute infarct. Cerebellum unremarkable. Ventricular size is normal. No abnormal intra or extra-axial mass lesion. Previously noted hyperdensity within the pons and inferior left temporal lobe have resolved in the interval since the prior examination in keeping with resolved hemorrhage. Vascular: No asymmetric hyperdense vasculature at the skull base. Skull: The calvarium is intact Sinuses/Orbits: Mild mucosal thickening within the a left maxillary sinus and frontal sinuses with opacification of several left ethmoid air cells. The orbits are unremarkable Other: Small right occipital scalp hematoma with punctate focus of subcutaneous gas in keeping with direct trauma. There is fluid opacification of several inferior left mastoid air cells with coalescence of the air cells in keeping with changes of chronic mastoiditis. Middle ear cavities and right mastoid air cells are clear. CT CERVICAL SPINE FINDINGS Alignment: There is reversal of the normal cervical lordosis from C2-C6 likely degenerative in nature. No listhesis. Skull base and vertebrae: The craniocervical junction is unremarkable. The atlantodental interval is normal. No acute fracture of the cervical spine. Soft tissues and spinal canal: Posterior disc osteophyte complex at C4-5 results in moderate to severe central canal stenosis with an AP diameter of the spinal canal of 6-7 mm and resultant flattening  of the thecal sac. Posterior disc osteophyte complex ease at C3-4 and C5-6 results in mild central canal stenosis. No canal hematoma. No prevertebral soft tissue swelling. No paraspinal fluid collections are identified. Disc levels: Review of the sagittal images demonstrates intervertebral disc space narrowing and endplate remodeling throughout the cervical spine, most severe at C4-C6 in keeping with changes of severe degenerative disc disease. Vertebral body height has been preserved. Review of the axial images demonstrates multilevel uncovertebral and facet arthrosis resulting in multilevel moderate to severe neural foraminal narrowing, most severe on the right at C2-3, C3-4, C4-5, and C5-6 and on the left at C5-6. Upper chest: Biapical scarring and left apical calcified pleural plaques are identified. Other: Thyroid unremarkable IMPRESSION: Acute intracranial hemorrhage with small right parietal subdural hematoma demonstrating mild mass effect upon the right cerebral hemisphere. No midline shift. Additionally, mild subarachnoid hemorrhage is seen involving the medial right temporal lobe. Previously noted foci of hyperattenuation involving the pons and left temporal lobe have resolved in keeping with hemorrhage at that time. Fluid opacification of the inferior left mastoid air cells with coalescence of  the air cells in keeping with chronic left mastoiditis. Mild paranasal sinus disease. No acute fracture or listhesis of the cervical spine. Degenerative disc and degenerative joint disease resulting in moderate to severe central canal stenosis at C4-5 and multilevel neural foraminal narrowing as described above. These results were called by telephone at the time of interpretation on 01/26/2021 at 12:36 am to provider [facility] , who verbally acknowledged these results. Electronically Signed   By: Fidela Salisbury MD   On: 01/26/2021 00:39   CT CERVICAL SPINE WO CONTRAST  Result Date: 01/26/2021 CLINICAL DATA:   Fall, head injury, neck injury, chronic anticoagulation EXAM: CT HEAD WITHOUT CONTRAST CT CERVICAL SPINE WITHOUT CONTRAST TECHNIQUE: Multidetector CT imaging of the head and cervical spine was performed following the standard protocol without intravenous contrast. Multiplanar CT image reconstructions of the cervical spine were also generated. COMPARISON:  MRI head 09/17/2020, CT neck 04/01/2017 FINDINGS: CT HEAD FINDINGS Brain: There is normal anatomic configuration of the brain. A small right subdural hematoma is seen along the right parietal convexity measuring 8-9 mm in thickness on coronal image # 47 and demonstrating mild mass effect upon the right cerebral hemisphere. There is no associated midline shift. Additionally, there is a small focus of subarachnoid hemorrhage seen within the sulcus of the hippocampal gyrus, best noted on coronal image # 43. No acute infarct. Cerebellum unremarkable. Ventricular size is normal. No abnormal intra or extra-axial mass lesion. Previously noted hyperdensity within the pons and inferior left temporal lobe have resolved in the interval since the prior examination in keeping with resolved hemorrhage. Vascular: No asymmetric hyperdense vasculature at the skull base. Skull: The calvarium is intact Sinuses/Orbits: Mild mucosal thickening within the a left maxillary sinus and frontal sinuses with opacification of several left ethmoid air cells. The orbits are unremarkable Other: Small right occipital scalp hematoma with punctate focus of subcutaneous gas in keeping with direct trauma. There is fluid opacification of several inferior left mastoid air cells with coalescence of the air cells in keeping with changes of chronic mastoiditis. Middle ear cavities and right mastoid air cells are clear. CT CERVICAL SPINE FINDINGS Alignment: There is reversal of the normal cervical lordosis from C2-C6 likely degenerative in nature. No listhesis. Skull base and vertebrae: The craniocervical  junction is unremarkable. The atlantodental interval is normal. No acute fracture of the cervical spine. Soft tissues and spinal canal: Posterior disc osteophyte complex at C4-5 results in moderate to severe central canal stenosis with an AP diameter of the spinal canal of 6-7 mm and resultant flattening of the thecal sac. Posterior disc osteophyte complex ease at C3-4 and C5-6 results in mild central canal stenosis. No canal hematoma. No prevertebral soft tissue swelling. No paraspinal fluid collections are identified. Disc levels: Review of the sagittal images demonstrates intervertebral disc space narrowing and endplate remodeling throughout the cervical spine, most severe at C4-C6 in keeping with changes of severe degenerative disc disease. Vertebral body height has been preserved. Review of the axial images demonstrates multilevel uncovertebral and facet arthrosis resulting in multilevel moderate to severe neural foraminal narrowing, most severe on the right at C2-3, C3-4, C4-5, and C5-6 and on the left at C5-6. Upper chest: Biapical scarring and left apical calcified pleural plaques are identified. Other: Thyroid unremarkable IMPRESSION: Acute intracranial hemorrhage with small right parietal subdural hematoma demonstrating mild mass effect upon the right cerebral hemisphere. No midline shift. Additionally, mild subarachnoid hemorrhage is seen involving the medial right temporal lobe. Previously noted foci of hyperattenuation  involving the pons and left temporal lobe have resolved in keeping with hemorrhage at that time. Fluid opacification of the inferior left mastoid air cells with coalescence of the air cells in keeping with chronic left mastoiditis. Mild paranasal sinus disease. No acute fracture or listhesis of the cervical spine. Degenerative disc and degenerative joint disease resulting in moderate to severe central canal stenosis at C4-5 and multilevel neural foraminal narrowing as described above.  These results were called by telephone at the time of interpretation on 01/26/2021 at 12:36 am to provider [facility] , who verbally acknowledged these results. Electronically Signed   By: Fidela Salisbury MD   On: 01/26/2021 00:39   DG Pelvis Portable  Result Date: 01/25/2021 CLINICAL DATA:  Fall EXAM: PORTABLE PELVIS 1-2 VIEWS COMPARISON:  None. FINDINGS: SI joints are non widened. Pubic symphysis and rami appear intact. No acute displaced fracture or malalignment. IMPRESSION: Negative. Electronically Signed   By: Donavan Foil M.D.   On: 01/25/2021 23:52   DG Chest Port 1 View  Result Date: 01/25/2021 CLINICAL DATA:  Fall EXAM: PORTABLE CHEST 1 VIEW COMPARISON:  09/17/2020 FINDINGS: Hyperinflated lungs. No acute consolidation or effusion. Normal cardiac size. Slight asymmetric irregular opacity in the right hilus. No pneumothorax. IMPRESSION: No acute infiltrate or edema. Slight asymmetric irregular opacity in the right hilus, uncertain if this is due to summation artifact and rotation versus hilar abnormality. Correlation with chest CT could be considered. Electronically Signed   By: Donavan Foil M.D.   On: 01/25/2021 23:54    Pertinent labs & imaging results that were available during my care of the patient were reviewed by me and considered in my medical decision making (see chart for details).  Medications Ordered in ED Medications  clevidipine (CLEVIPREX) infusion 0.5 mg/mL (8 mg/hr Intravenous Rate/Dose Change 01/26/21 0101)  docusate sodium (COLACE) capsule 100 mg (has no administration in time range)  polyethylene glycol (MIRALAX / GLYCOLAX) packet 17 g (has no administration in time range)  lactated ringers infusion (has no administration in time range)  ondansetron (ZOFRAN) injection 4 mg (4 mg Intravenous Given 01/25/21 2345)  sodium chloride 0.9 % bolus 1,000 mL (1,000 mLs Intravenous New Bag/Given 01/25/21 2352)                                                                                                                                     Procedures .1-3 Lead EKG Interpretation Performed by: Fatima Blank, MD Authorized by: Fatima Blank, MD     Interpretation: abnormal     ECG rate:  100-120s   ECG rate assessment: tachycardic     Rhythm comment:  Sinus tachycardia with prequent PVCs and episodes of NSVT.   Ectopy: PVCs     Conduction: normal   .Critical Care Performed by: Fatima Blank, MD Authorized by: Fatima Blank, MD   Critical care provider statement:    Critical care  time (minutes):  45   Critical care was necessary to treat or prevent imminent or life-threatening deterioration of the following conditions:  Trauma   Critical care was time spent personally by me on the following activities:  Discussions with consultants, evaluation of patient's response to treatment, examination of patient, ordering and performing treatments and interventions, ordering and review of laboratory studies, ordering and review of radiographic studies, pulse oximetry, re-evaluation of patient's condition, obtaining history from patient or surrogate and review of old charts   Care discussed with: admitting provider   .Marland KitchenLaceration Repair  Date/Time: 01/26/2021 2:05 AM Performed by: Fatima Blank, MD Authorized by: Fatima Blank, MD   Consent:    Consent obtained:  Verbal   Consent given by:  Patient and spouse   Risks discussed:  Poor wound healing, poor cosmetic result and nerve damage   Alternatives discussed:  No treatment and delayed treatment Universal protocol:    Procedure explained and questions answered to patient or proxy's satisfaction: yes     Patient identity confirmed:  Verbally with patient and arm band Anesthesia:    Anesthesia method:  None Laceration details:    Location:  Scalp   Scalp location:  Occipital   Length (cm):  4.5   Depth (mm):  10 Pre-procedure details:    Preparation:  Patient was  prepped and draped in usual sterile fashion and imaging obtained to evaluate for foreign bodies Exploration:    Hemostasis achieved with:  Direct pressure   Imaging obtained comment:  CT   Wound exploration: wound explored through full range of motion and entire depth of wound visualized     Wound extent: no foreign bodies/material noted   Treatment:    Area cleansed with:  Soap and water   Amount of cleaning:  Standard   Irrigation solution:  Sterile saline   Irrigation method:  Pressure wash Skin repair:    Repair method:  Staples   Number of staples:  3 Approximation:    Approximation:  Close Repair type:    Repair type:  Simple Post-procedure details:    Dressing:  Open (no dressing)   Procedure completion:  Tolerated well, no immediate complications    (including critical care time)  Medical Decision Making / ED Course I have reviewed the nursing notes for this encounter and the patient's prior records (if available in EHR or on provided paperwork).   Ryan Lynn was evaluated in Emergency Department on 01/26/2021 for the symptoms described in the history of present illness. He was evaluated in the context of the global COVID-19 pandemic, which necessitated consideration that the patient might be at risk for infection with the SARS-CoV-2 virus that causes COVID-19. Institutional protocols and algorithms that pertain to the evaluation of patients at risk for COVID-19 are in a state of rapid change based on information released by regulatory bodies including the CDC and federal and state organizations. These policies and algorithms were followed during the patient's care in the ED.  Level 2 trauma Fall downstairs with head trauma and amnesia to the event. Patient noted to have nonsustained V. tach in route with EMS. Noted to be hypertensive and tachycardic. ABCs intact. Secondary as above. Plain film of chest and pelvis without any acute injuries. CT head and cervical spine  obtained given mechanism. No other injuries noted on exam requiring images.  CT head notable for moderate size subdural with mass-effect but no midline shift.  Patient also with subarachnoid bleed.  CT cervical  spine without any acute fractures.  He was placed on Cleviprex for BP control. Case discussed with Dr. Grandville Silos from neurosurgery. Agrees with BP control and requests CT scan in the morning.  EKG notable for frequent PVCs and nonsustained V. Tach. Screening labs obtained.  On review of records, patient was followed by cardiology for palpitations and noted to have PVCs.  He was initially placed on metoprolol which he did not tolerate. He was able to tolerate Bystolic, but was taken off of it in 2015 as a trial. He never follow up with cardiology afterwards due to not being symptomatic.  Additionally, patient had a syncopal episode in 08/2020 resulting in fall and SAH/IPH.  Cannot rule out syncope from dysrhythmia resulting in fall.  Labs otherwise are grossly reassuring without anemia.  Initial troponin negative.  BNP reassuring.  No significant electrolyte derangements or renal insufficiency.  Consulted critical care for admission.      Final Clinical Impression(s) / ED Diagnoses Final diagnoses:  Fall  Subdural hematoma (HCC)  Subarachnoid bleed ([place])  Laceration of scalp, initial encounter  Abrasion  Nonsustained ventricular tachycardia ([place])      This chart was dictated using voice recognition software.  Despite best efforts to proofread,  errors can occur which can change the documentation meaning.   Fatima Blank, MD 01/26/21 [phone]

## 2021-01-26 ENCOUNTER — Inpatient Hospital Stay (HOSPITAL_COMMUNITY): Payer: Medicare Other

## 2021-01-26 ENCOUNTER — Emergency Department (HOSPITAL_COMMUNITY): Payer: Medicare Other

## 2021-01-26 ENCOUNTER — Other Ambulatory Visit (HOSPITAL_COMMUNITY): Payer: Medicare Other

## 2021-01-26 DIAGNOSIS — I493 Ventricular premature depolarization: Secondary | ICD-10-CM | POA: Diagnosis present

## 2021-01-26 DIAGNOSIS — I499 Cardiac arrhythmia, unspecified: Secondary | ICD-10-CM | POA: Diagnosis present

## 2021-01-26 DIAGNOSIS — S0101XA Laceration without foreign body of scalp, initial encounter: Secondary | ICD-10-CM | POA: Diagnosis present

## 2021-01-26 DIAGNOSIS — I4891 Unspecified atrial fibrillation: Secondary | ICD-10-CM | POA: Diagnosis present

## 2021-01-26 DIAGNOSIS — I480 Paroxysmal atrial fibrillation: Secondary | ICD-10-CM | POA: Diagnosis present

## 2021-01-26 DIAGNOSIS — Z681 Body mass index (BMI) 19 or less, adult: Secondary | ICD-10-CM | POA: Diagnosis not present

## 2021-01-26 DIAGNOSIS — Z515 Encounter for palliative care: Secondary | ICD-10-CM | POA: Diagnosis not present

## 2021-01-26 DIAGNOSIS — I4819 Other persistent atrial fibrillation: Secondary | ICD-10-CM | POA: Diagnosis not present

## 2021-01-26 DIAGNOSIS — I609 Nontraumatic subarachnoid hemorrhage, unspecified: Secondary | ICD-10-CM | POA: Diagnosis not present

## 2021-01-26 DIAGNOSIS — W109XXA Fall (on) (from) unspecified stairs and steps, initial encounter: Secondary | ICD-10-CM | POA: Diagnosis present

## 2021-01-26 DIAGNOSIS — Z66 Do not resuscitate: Secondary | ICD-10-CM | POA: Diagnosis present

## 2021-01-26 DIAGNOSIS — S06360A Traumatic hemorrhage of cerebrum, unspecified, without loss of consciousness, initial encounter: Secondary | ICD-10-CM | POA: Insufficient documentation

## 2021-01-26 DIAGNOSIS — S066X9A Traumatic subarachnoid hemorrhage with loss of consciousness of unspecified duration, initial encounter: Secondary | ICD-10-CM | POA: Diagnosis present

## 2021-01-26 DIAGNOSIS — I5020 Unspecified systolic (congestive) heart failure: Secondary | ICD-10-CM | POA: Diagnosis present

## 2021-01-26 DIAGNOSIS — S065X9A Traumatic subdural hemorrhage with loss of consciousness of unspecified duration, initial encounter: Secondary | ICD-10-CM | POA: Diagnosis present

## 2021-01-26 DIAGNOSIS — I472 Ventricular tachycardia: Secondary | ICD-10-CM | POA: Diagnosis present

## 2021-01-26 DIAGNOSIS — E43 Unspecified severe protein-calorie malnutrition: Secondary | ICD-10-CM | POA: Diagnosis present

## 2021-01-26 DIAGNOSIS — R55 Syncope and collapse: Secondary | ICD-10-CM

## 2021-01-26 DIAGNOSIS — K219 Gastro-esophageal reflux disease without esophagitis: Secondary | ICD-10-CM | POA: Diagnosis present

## 2021-01-26 DIAGNOSIS — I160 Hypertensive urgency: Secondary | ICD-10-CM | POA: Diagnosis present

## 2021-01-26 DIAGNOSIS — H409 Unspecified glaucoma: Secondary | ICD-10-CM | POA: Diagnosis present

## 2021-01-26 DIAGNOSIS — R1313 Dysphagia, pharyngeal phase: Secondary | ICD-10-CM | POA: Diagnosis present

## 2021-01-26 DIAGNOSIS — W19XXXA Unspecified fall, initial encounter: Secondary | ICD-10-CM | POA: Diagnosis present

## 2021-01-26 DIAGNOSIS — S065XAA Traumatic subdural hemorrhage with loss of consciousness status unknown, initial encounter: Secondary | ICD-10-CM | POA: Diagnosis present

## 2021-01-26 DIAGNOSIS — R627 Adult failure to thrive: Secondary | ICD-10-CM | POA: Diagnosis present

## 2021-01-26 DIAGNOSIS — J690 Pneumonitis due to inhalation of food and vomit: Secondary | ICD-10-CM | POA: Diagnosis not present

## 2021-01-26 DIAGNOSIS — I429 Cardiomyopathy, unspecified: Secondary | ICD-10-CM | POA: Diagnosis present

## 2021-01-26 DIAGNOSIS — I77810 Thoracic aortic ectasia: Secondary | ICD-10-CM | POA: Diagnosis present

## 2021-01-26 DIAGNOSIS — N139 Obstructive and reflux uropathy, unspecified: Secondary | ICD-10-CM | POA: Diagnosis present

## 2021-01-26 DIAGNOSIS — R0902 Hypoxemia: Secondary | ICD-10-CM | POA: Diagnosis not present

## 2021-01-26 DIAGNOSIS — Z20822 Contact with and (suspected) exposure to covid-19: Secondary | ICD-10-CM | POA: Diagnosis present

## 2021-01-26 DIAGNOSIS — R64 Cachexia: Secondary | ICD-10-CM | POA: Diagnosis present

## 2021-01-26 DIAGNOSIS — I110 Hypertensive heart disease with heart failure: Secondary | ICD-10-CM | POA: Diagnosis present

## 2021-01-26 LAB — COMPREHENSIVE METABOLIC PANEL
ALT: 22 U/L (ref 0–44)
AST: 30 U/L (ref 15–41)
Albumin: 4.1 g/dL (ref 3.5–5.0)
Alkaline Phosphatase: 91 U/L (ref 38–126)
Anion gap: 14 (ref 5–15)
BUN: 20 mg/dL (ref 8–23)
CO2: 22 mmol/L (ref 22–32)
Calcium: 9.4 mg/dL (ref 8.9–10.3)
Chloride: 97 mmol/L — ABNORMAL LOW (ref 98–111)
Creatinine, Ser: 0.83 mg/dL (ref 0.61–1.24)
GFR, Estimated: >60 mL/min (ref >60)
Glucose, Bld: 123 mg/dL — ABNORMAL HIGH (ref 70–99)
Potassium: 4.1 mmol/L (ref 3.5–5.1)
Sodium: 133 mmol/L — ABNORMAL LOW (ref 135–145)
Total Bilirubin: 0.7 mg/dL (ref 0.3–1.2)
Total Protein: 6.9 g/dL (ref 6.5–8.1)

## 2021-01-26 LAB — CBC WITH DIFFERENTIAL/PLATELET
Abs Immature Granulocytes: 0.14 10*3/uL — ABNORMAL HIGH (ref 0.00–0.07)
Basophils Absolute: 0.1 10*3/uL (ref 0.0–0.1)
Basophils Relative: 1 %
Eosinophils Absolute: 0.2 10*3/uL (ref 0.0–0.5)
Eosinophils Relative: 2 %
HCT: 40.6 % (ref 39.0–52.0)
Hemoglobin: 13.7 g/dL (ref 13.0–17.0)
Immature Granulocytes: 1 %
Lymphocytes Relative: 11 %
Lymphs Abs: 1.4 10*3/uL (ref 0.7–4.0)
MCH: 32.2 pg (ref 26.0–34.0)
MCHC: 33.7 g/dL (ref 30.0–36.0)
MCV: 95.3 fL (ref 80.0–100.0)
Monocytes Absolute: 1 10*3/uL (ref 0.1–1.0)
Monocytes Relative: 7 %
Neutro Abs: 10.6 10*3/uL — ABNORMAL HIGH (ref 1.7–7.7)
Neutrophils Relative %: 78 %
Platelets: 195 10*3/uL (ref 150–400)
RBC: 4.26 MIL/uL (ref 4.22–5.81)
RDW: 13 % (ref 11.5–15.5)
WBC: 13.4 10*3/uL — ABNORMAL HIGH (ref 4.0–10.5)
nRBC: 0 % (ref 0.0–0.2)

## 2021-01-26 LAB — ECHOCARDIOGRAM COMPLETE
AR max vel: 2.53 cm2
AV Area VTI: 2.6 cm2
AV Area mean vel: 2.36 cm2
AV Mean grad: 2 mmHg
AV Peak grad: 3.2 mmHg
Ao pk vel: 0.89 m/s
Calc EF: 44.6 %
Height: 72 in
S' Lateral: 3.8 cm
Single Plane A2C EF: 46.8 %
Single Plane A4C EF: 48.4 %
Weight: 2155.22 oz

## 2021-01-26 LAB — I-STAT CHEM 8, ED
BUN: 23 mg/dL (ref 8–23)
Calcium, Ion: 1.02 mmol/L — ABNORMAL LOW (ref 1.15–1.40)
Chloride: 102 mmol/L (ref 98–111)
Creatinine, Ser: 0.7 mg/dL (ref 0.61–1.24)
Glucose, Bld: 119 mg/dL — ABNORMAL HIGH (ref 70–99)
HCT: 44 % (ref 39.0–52.0)
Hemoglobin: 15 g/dL (ref 13.0–17.0)
Potassium: 4.1 mmol/L (ref 3.5–5.1)
Sodium: 132 mmol/L — ABNORMAL LOW (ref 135–145)
TCO2: 23 mmol/L (ref 22–32)

## 2021-01-26 LAB — MAGNESIUM
Magnesium: 1.8 mg/dL (ref 1.7–2.4)
Magnesium: 2 mg/dL (ref 1.7–2.4)

## 2021-01-26 LAB — BASIC METABOLIC PANEL
Anion gap: 8 (ref 5–15)
BUN: 17 mg/dL (ref 8–23)
CO2: 25 mmol/L (ref 22–32)
Calcium: 9.2 mg/dL (ref 8.9–10.3)
Chloride: 100 mmol/L (ref 98–111)
Creatinine, Ser: 0.82 mg/dL (ref 0.61–1.24)
GFR, Estimated: >60 mL/min (ref >60)
Glucose, Bld: 131 mg/dL — ABNORMAL HIGH (ref 70–99)
Potassium: 4 mmol/L (ref 3.5–5.1)
Sodium: 133 mmol/L — ABNORMAL LOW (ref 135–145)

## 2021-01-26 LAB — BRAIN NATRIURETIC PEPTIDE: B Natriuretic Peptide: 171.8 pg/mL — ABNORMAL HIGH (ref 0.0–100.0)

## 2021-01-26 LAB — CBC
HCT: 41.7 % (ref 39.0–52.0)
Hemoglobin: 13.8 g/dL (ref 13.0–17.0)
MCH: 31.3 pg (ref 26.0–34.0)
MCHC: 33.1 g/dL (ref 30.0–36.0)
MCV: 94.6 fL (ref 80.0–100.0)
Platelets: 209 10*3/uL (ref 150–400)
RBC: 4.41 MIL/uL (ref 4.22–5.81)
RDW: 12.9 % (ref 11.5–15.5)
WBC: 14.2 10*3/uL — ABNORMAL HIGH (ref 4.0–10.5)
nRBC: 0 % (ref 0.0–0.2)

## 2021-01-26 LAB — TSH: TSH: 0.737 u[IU]/mL (ref 0.350–4.500)

## 2021-01-26 LAB — URINALYSIS, ROUTINE W REFLEX MICROSCOPIC
Bilirubin Urine: NEGATIVE
Glucose, UA: NEGATIVE mg/dL
Hgb urine dipstick: NEGATIVE
Ketones, ur: 5 mg/dL — AB
Leukocytes,Ua: NEGATIVE
Nitrite: NEGATIVE
Protein, ur: NEGATIVE mg/dL
Specific Gravity, Urine: 1.013 (ref 1.005–1.030)
pH: 7 (ref 5.0–8.0)

## 2021-01-26 LAB — MRSA PCR SCREENING: MRSA by PCR: NEGATIVE

## 2021-01-26 LAB — TROPONIN I (HIGH SENSITIVITY)
Troponin I (High Sensitivity): 6 ng/L (ref <18)
Troponin I (High Sensitivity): 9 ng/L (ref <18)

## 2021-01-26 LAB — SARS CORONAVIRUS 2 BY RT PCR (HOSPITAL ORDER, PERFORMED IN ~~LOC~~ HOSPITAL LAB): SARS Coronavirus 2: NEGATIVE

## 2021-01-26 LAB — LACTIC ACID, PLASMA: Lactic Acid, Venous: 1.9 mmol/L (ref 0.5–1.9)

## 2021-01-26 LAB — PHOSPHORUS: Phosphorus: 2.4 mg/dL — ABNORMAL LOW (ref 2.5–4.6)

## 2021-01-26 LAB — PROTIME-INR
INR: 1 (ref 0.8–1.2)
Prothrombin Time: 13.1 seconds (ref 11.4–15.2)

## 2021-01-26 LAB — GLUCOSE, CAPILLARY
Glucose-Capillary: 121 mg/dL — ABNORMAL HIGH (ref 70–99)
Glucose-Capillary: 135 mg/dL — ABNORMAL HIGH (ref 70–99)

## 2021-01-26 MED ORDER — LABETALOL HCL 5 MG/ML IV SOLN: 10.0000 mg | INTRAVENOUS | Status: DC | PRN

## 2021-01-26 MED ORDER — DOCUSATE SODIUM 100 MG PO CAPS: 100.0000 mg | ORAL_CAPSULE | Freq: Two times a day (BID) | ORAL | Status: DC | PRN

## 2021-01-26 MED ORDER — CHLORHEXIDINE GLUCONATE 0.12 % MT SOLN
15.0000 mL | Freq: Two times a day (BID) | OROMUCOSAL | Status: DC
  Administered 2021-01-26 – 2021-02-08 (×26): 15 mL via OROMUCOSAL
  Filled 2021-01-26 (×23): qty 15

## 2021-01-26 MED ORDER — CLEVIDIPINE BUTYRATE 0.5 MG/ML IV EMUL
0.0000 mg/h | INTRAVENOUS | Status: DC
  Administered 2021-01-26: 1 mg/h via INTRAVENOUS
  Filled 2021-01-26: qty 100
  Filled 2021-01-26 (×2): qty 50

## 2021-01-26 MED ORDER — LACTATED RINGERS IV SOLN: INTRAVENOUS | Status: DC

## 2021-01-26 MED ORDER — CHLORHEXIDINE GLUCONATE CLOTH 2 % EX PADS
6.0000 | MEDICATED_PAD | Freq: Every day | CUTANEOUS | Status: DC
  Administered 2021-01-26 – 2021-02-08 (×11): 6 via TOPICAL

## 2021-01-26 MED ORDER — LATANOPROST 0.005 % OP SOLN
1.0000 [drp] | Freq: Every day | OPHTHALMIC | Status: DC
  Administered 2021-01-28 – 2021-02-07 (×10): 1 [drp] via OPHTHALMIC
  Filled 2021-01-26 (×2): qty 2.5

## 2021-01-26 MED ORDER — ORAL CARE MOUTH RINSE
15.0000 mL | Freq: Two times a day (BID) | OROMUCOSAL | Status: DC
  Administered 2021-01-26 – 2021-02-08 (×21): 15 mL via OROMUCOSAL

## 2021-01-26 MED ORDER — DORZOLAMIDE HCL 2 % OP SOLN
1.0000 [drp] | Freq: Three times a day (TID) | OPHTHALMIC | Status: DC
  Administered 2021-01-26 – 2021-02-08 (×40): 1 [drp] via OPHTHALMIC
  Filled 2021-01-26: qty 10

## 2021-01-26 MED ORDER — POLYETHYLENE GLYCOL 3350 17 G PO PACK: 17.0000 g | PACK | Freq: Every day | ORAL | Status: DC | PRN

## 2021-01-26 NOTE — Progress Notes (Addendum)
NAME:  Ryan Lynn, MRN:  [id number], DOB:  15-Nov-1934, LOS: 0 ADMISSION DATE:  01/25/2021, CONSULTATION DATE:  1/27/2 REFERRING MD:  Leonette Monarch  CHIEF COMPLAINT:  [place]   Brief History   Ryan Lynn is a 87 y.o. male who fell at home and sustained ICH with small SDH and SAH.  History of present illness   Ryan Lynn is a 87 y.o. male who has a PMH including but not limited to glaucoma (see "past medical history" for rest).  He presented to [facility] ED 1/26 after he fell at home down a flight of stairs.  He doesn't recall the fall or what happened before.  Wife heard the fall and went to him and found him to be conscious.    CT demonstrated ICH with small right SDH.  While in ED, he had a few PVC's and a brief run of NSVT.  PCCM subsequently asked to admit. Neurosurgery to see in consultation.  He did have episode in 2021 where he passed out and suffered small ICH.  He did not require admission and was to follow up with neurology as an outpatient.  Past Medical History  has SAH (subarachnoid hemorrhage) ([place]); Syncope and collapse; SDH (subdural hematoma) ([place]); Atrial fibrillation with rapid ventricular response ([place]); Scalp laceration; Fall; and Traumatic hemorrhage of cerebrum without loss of consciousness ([place]) on their problem list.  Significant Hospital Events   1/27 > admit.  Consults:  NSGY.  Procedures:  None.  Significant Diagnostic Tests:  CT head 1/26 > ICH with small right parietal SDH with mild mass effect, no MLS.  Mild SAH of medial right temporal lobe. Echo 1/27 >  Carotid duplex 1/27 >   Micro Data:  COVID 1/26 > neg.  Antimicrobials:  None.   Interim history/subjective:   Complains of being cold. Remains on continuous cleviprex for BP control   Objective:  Blood pressure (!) 110/56, pulse 92, temperature 100.1 F (37.8 C), temperature source Oral, resp. rate 18, height 6' (1.829 m), weight 61.1 kg, SpO2 97 %.        Intake/Output Summary (Last 24 hours) at  01/26/2021 0753 Last data filed at 01/26/2021 0700 Gross per 24 hour  Intake 370.25 ml  Output 1400 ml  Net -1029.75 ml   Filed Weights   01/25/21 2340 01/26/21 0640  Weight: 69.4 kg 61.1 kg    Examination: General: elderly male, laying in bed  Neuro: AAOX3, moves all 4  HEENT: scalp lac Cardiovascular: RRR, s1 s2 Lungs: CTAB, no crackles  Abdomen: BS soft, nt nd  Musculoskeletal: no edema  Skin: no rash   Assessment & Plan:   ICH with small SDH and SAH - after a fall downstairs.  Pt unable to recall the event, not sure if he passed out or not; however, when wife went to him, he had full consciousness. Hypertensive urgency  P - neurosurgery was consulted overnight - admitted to medicine for BP control  - on cleviprex, with plans to wean off, once off will dc off mar  - may need to start oral bp med  - we will need to make sure he can swallow first. Will have nurse due bedside - PRN IV labetalol in the mean time - does he need follow up imaging of the head? We will need to ask our neurosx colleagues   Syncope - few PVC's while in ED and a brief run of NSVT.  Per wife, did have episode in 2021 where he  fell and passed out with subsequent small ICH (did not require admission, was to follow up with neurology). P: Orders placed for ECHO and carotids to include syncope work up    [employer] (evaluated daily):  Diet: NPO for now. Pain/Anxiety/Delirium protocol (if indicated): N/A. VAP protocol (if indicated): N/A. DVT prophylaxis: SCD's only. GI prophylaxis: N/A. Glucose control: N/A. Mobility: Bedrest. Disposition: ICU.  Goals of Care:  Last date of multidisciplinary goals of care discussion: wife updated at beside  Family and staff present: None. Summary of discussion: None. Follow up goals of care discussion due: 1/2. Code Status:  Full.  Labs   CBC: Recent Labs  Lab 01/25/21 2345 01/26/21 0001 01/26/21 0500  WBC 13.4*  --  14.2*  NEUTROABS 10.6*  --   --    HGB 13.7 15.0 13.8  HCT 40.6 44.0 41.7  MCV 95.3  --  94.6  PLT 195  --  740   Basic Metabolic Panel: Recent Labs  Lab 01/25/21 2345 01/26/21 0001 01/26/21 0500  NA 133* 132* 133*  K 4.1 4.1 4.0  CL 97* 102 100  CO2 22  --  25  GLUCOSE 123* 119* 131*  BUN 20 23 17   CREATININE 0.83 0.70 0.82  CALCIUM 9.4  --  9.2  MG 2.0  --  1.8  PHOS  --   --  2.4*   GFR: Estimated Creatinine Clearance: 58 mL/min (by C-G formula based on SCr of 0.82 mg/dL). Recent Labs  Lab 01/25/21 2345 01/26/21 0500  WBC 13.4* 14.2*  LATICACIDVEN 1.9  --    Liver Function Tests: Recent Labs  Lab 01/25/21 2345  AST 30  ALT 22  ALKPHOS 91  BILITOT 0.7  PROT 6.9  ALBUMIN 4.1   No results for input(s): LIPASE, AMYLASE in the last 168 hours. No results for input(s): AMMONIA in the last 168 hours. ABG    Component Value Date/Time   TCO2 23 01/26/2021 0001    Coagulation Profile: Recent Labs  Lab 01/25/21 2345  INR 1.0   Cardiac Enzymes: No results for input(s): CKTOTAL, CKMB, CKMBINDEX, TROPONINI in the last 168 hours. HbA1C: No results found for: HGBA1C CBG: Recent Labs  Lab 01/26/21 0653 01/26/21 0735  GLUCAP 121* 135*    This patient is critically ill with multiple organ system failure; which, requires frequent high complexity decision making, assessment, support, evaluation, and titration of therapies. This was completed through the application of advanced monitoring technologies and extensive interpretation of multiple databases. During this encounter critical care time was devoted to patient care services described in this note for 32 minutes.  [facility] 01/26/2021 9:15 AM

## 2021-01-26 NOTE — Progress Notes (Signed)
Carotid duplex has been completed.   Preliminary results in CV Proc.   Abram Sander 01/26/2021 2:39 PM

## 2021-01-26 NOTE — Progress Notes (Signed)
  Echocardiogram 2D Echocardiogram has been performed.  Ryan Lynn 01/26/2021, 11:16 AM

## 2021-01-26 NOTE — Progress Notes (Signed)
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 Hx of dysphagia. Will follow up next date.    [place], CCC-SLP Acute Rehabilitation Services   01/26/2021, 2:59 PM

## 2021-01-26 NOTE — Consult Note (Signed)
CC: s/p fall  HPI:     Patient is a 87 y.o. male who presented to the hospital after a fall down a flight of stairs.  He had brief LOC and was amnestic to the event.  He has a history of small ICH in 2021 after an episode of passing out and falling.  He is not on any blood thinners.  He denies any headache or new neurologic changes.   Patient Active Problem List   Diagnosis Date Noted   SAH (subarachnoid hemorrhage) ([place]) 01/26/2021   Syncope and collapse 01/26/2021   SDH (subdural hematoma) ([place]) 01/26/2021   Atrial fibrillation with rapid ventricular response ([place]) 01/26/2021   Scalp laceration 01/26/2021   Fall    Traumatic hemorrhage of cerebrum without loss of consciousness ([place])    No past medical history on file.    Medications Prior to Admission  Medication Sig Dispense Refill Last Dose   dorzolamide (TRUSOPT) 2 % ophthalmic solution 1 drop 3 (three) times daily.   01/25/2021 at Unknown time   Not on File  Social History   Tobacco Use   Smoking status: Not on file   Smokeless tobacco: Not on file  Substance Use Topics   Alcohol use: Not on file    No family history on file.   Review of Systems Pertinent items are noted in HPI.  10 of 12 systems reviewed and otherwise normal.  Objective:   Patient Vitals for the past 8 hrs:  BP Temp Temp src Pulse Resp SpO2 Weight  01/26/21 1200 114/65 -- -- 91 (!) 22 100 % --  01/26/21 1120 -- 98.5 F (36.9 C) Oral -- -- -- --  01/26/21 1100 129/86 -- -- 91 18 100 % --  01/26/21 1000 (!) 142/82 -- -- 94 19 100 % --  01/26/21 0900 128/73 -- -- 93 (!) 22 98 % --  01/26/21 0832 -- 99.4 F (37.4 C) Axillary -- -- -- --  01/26/21 0800 115/61 -- -- 98 19 97 % --  01/26/21 0700 (!) 107/53 -- -- 96 17 95 % --  01/26/21 0645 (!) 110/56 -- -- 92 18 97 % --  01/26/21 0640 (!) 111/49 100.1 F (37.8 C) Oral 99 18 97 % 61.1 kg  01/26/21 0600 (!) 110/59 -- -- 93 17 100 % --  01/26/21 0545 (!) 115/56 -- -- 92 19 100 % --   01/26/21 0530 (!) 113/58 -- -- 91 19 99 % --  01/26/21 0523 -- 98.3 F (36.8 C) -- 89 19 99 % --  01/26/21 0515 (!) 107/57 -- -- 89 17 97 % --   I/O last 3 completed shifts: In: 370.3 [I.V.:74.7; IV Piggyback:295.6] Out: 1400 [Urine:1400] Total I/O In: 482.8 [I.V.:482.8] Out: 900 [Urine:900]    General : Alert, cooperative, no distress, appears stated age   Head:  Small scalp laceration   Eyes: PERRL, conjunctiva/corneas clear, EOM's intact. Fundi could not be visualized Neck: Supple Chest:  Respirations unlabored Chest wall: no tenderness or deformity Heart: Regular rate and rhythm Abdomen: Soft, nontender and nondistended Extremities: warm and well-perfused Skin: normal turgor, color and texture Neurologic:  Alert, oriented x 2.  Eyes open spontaneously. PERRL, EOMI, VFC, no facial droop. V1-3 intact.  No dysarthria, tongue protrusion symmetric.  CNII-XII intact. Normal strength, sensation and reflexes throughout.  No pronator drift, full strength in legs       Data Review: CBC    Component Value Date/Time   WBC 14.2 (H) 01/26/2021 0500  RBC 4.41 01/26/2021 0500   HGB 13.8 01/26/2021 0500   HCT 41.7 01/26/2021 0500   PLT 209 01/26/2021 0500   MCV 94.6 01/26/2021 0500   MCH 31.3 01/26/2021 0500   MCHC 33.1 01/26/2021 0500   RDW 12.9 01/26/2021 0500   LYMPHSABS 1.4 01/25/2021 2345   MONOABS 1.0 01/25/2021 2345   EOSABS 0.2 01/25/2021 2345   BASOSABS 0.1 01/25/2021 2345    Radiology review:  CT head was reviewed last night.  There is a thin right convexity acute subdural hematoma measuring 7 mm without mass effect.  Assessment:   Principal Problem:   Syncope and collapse Active Problems:   SAH (subarachnoid hemorrhage) (HCC)   SDH (subdural hematoma) (H[facility])   Atrial fibrillation with rapid ventricular response (H[facility])   Scalp laceration   Fall  86 yo M s/p fall, possible syncopal event, who suffered a head injury and has a thin right acute subdural  hematoma  Plan:   No neurosurgical intervention is indicated.  I have ordered a repeat head CT without contrast.  If this is stable, he will not need further ICU monitoring from a neurologic standpoint.  I would recommend holding off on pharmacologic DVT prophylaxis for now.  Would also recommend Keppra 500 mg PO BID x 7 days and keeping MAPs 70-100.  BP parameter may be able to be liberalized tomorrow depending on his f/u CT scan.

## 2021-01-26 NOTE — H&P (Signed)
NAME:  Ryan Lynn, MRN:  [id number], DOB:  [birth year], LOS: 0 ADMISSION DATE:  01/25/2021, CONSULTATION DATE:  1/27/2 REFERRING MD:  Leonette Monarch  CHIEF COMPLAINT:  [place]   Brief History   Ryan Lynn is a 87 y.o. male who fell at home and sustained ICH with small SDH and SAH.  History of present illness   Ryan Lynn is a 87 y.o. male who has a PMH including but not limited to glaucoma (see "past medical history" for rest).  He presented to [facility] ED 1/26 after he fell at home down a flight of stairs.  He doesn't recall the fall or what happened before.  Wife heard the fall and went to him and found him to be conscious.    CT demonstrated ICH with small right SDH.  While in ED, he had a few PVC's and a brief run of NSVT.  PCCM subsequently asked to admit. Neurosurgery to see in consultation.  He did have episode in 2021 where he passed out and suffered small ICH.  He did not require admission and was to follow up with neurology as an outpatient.  Past Medical History  has SAH (subarachnoid hemorrhage) ([place]) on their problem list.  Significant Hospital Events   1/27 > admit.  Consults:  NSGY.  Procedures:  None.  Significant Diagnostic Tests:  CT head 1/26 > ICH with small right parietal SDH with mild mass effect, no MLS.  Mild SAH of medial right temporal lobe. Echo 1/27 >  Carotid duplex 1/27 >   Micro Data:  COVID 1/26 > neg.  Antimicrobials:  None.   Interim history/subjective:  Stable.  Feels tired.  Objective:  Blood pressure 109/60, pulse (!) 110, temperature (!) 96.9 F (36.1 C), temperature source Temporal, resp. rate (!) 21, height 6' (1.829 m), weight 69.4 kg, SpO2 94 %.       No intake or output data in the 24 hours ending 01/26/21 0207 Filed Weights   01/25/21 2340  Weight: 69.4 kg    Examination: General: Adult male, elderly, in NAD. Neuro: A&O x 3, no deficits. HEENT: Posterior scalp laceration with staples noted. Sclerae anicteric.   EOMI. Cardiovascular: Tachy, few PVC's, no M/R/G.  Lungs: Respirations even and unlabored.  CTA bilaterally, No W/R/R.  Abdomen: BS x 4, soft, NT/ND.  Musculoskeletal: No gross deformities, no edema.  Skin: Intact, warm, no rashes.  Assessment & Plan:   ICH with small SDH and SAH - after a fall downstairs.  Pt unable to recall the event, not sure if he passed out or not; however, when wife went to him, he had full consciousness. - Neurosurgery consulted.  ? Syncope - few PVC's while in ED and a brief run of NSVT.  Per wife, did have episode in 2021 where he fell and passed out with subsequent small ICH (did not require admission, was to follow up with neurology). - Assess echo, carotid duplex.  Hypertension. - Cleviprex for goal SBP < 160.   Best Practice (evaluated daily):  Diet: NPO for now. Pain/Anxiety/Delirium protocol (if indicated): N/A. VAP protocol (if indicated): N/A. DVT prophylaxis: SCD's only. GI prophylaxis: N/A. Glucose control: N/A. Mobility: Bedrest. Disposition: ICU.  Goals of Care:  Last date of multidisciplinary goals of care discussion: None. We did update wife at bedside. Family and staff present: None. Summary of discussion: None. Follow up goals of care discussion due: 1/2. Code Status:  Full.  Labs   CBC: Recent Labs  Lab 01/25/21  2345 01/26/21 0001  WBC 13.4*  --   NEUTROABS 10.6*  --   HGB 13.7 15.0  HCT 40.6 44.0  MCV 95.3  --   PLT 195  --    Basic Metabolic Panel: Recent Labs  Lab 01/25/21 2345 01/26/21 0001  NA 133* 132*  K 4.1 4.1  CL 97* 102  CO2 22  --   GLUCOSE 123* 119*  BUN 20 23  CREATININE 0.83 0.70  CALCIUM 9.4  --   MG 2.0  --    GFR: Estimated Creatinine Clearance: 67.5 mL/min (by C-G formula based on SCr of 0.7 mg/dL). Recent Labs  Lab 01/25/21 2345  WBC 13.4*  LATICACIDVEN 1.9   Liver Function Tests: Recent Labs  Lab 01/25/21 2345  AST 30  ALT 22  ALKPHOS 91  BILITOT 0.7  PROT 6.9  ALBUMIN 4.1    No results for input(s): LIPASE, AMYLASE in the last 168 hours. No results for input(s): AMMONIA in the last 168 hours. ABG    Component Value Date/Time   TCO2 23 01/26/2021 0001    Coagulation Profile: Recent Labs  Lab 01/25/21 2345  INR 1.0   Cardiac Enzymes: No results for input(s): CKTOTAL, CKMB, CKMBINDEX, TROPONINI in the last 168 hours. HbA1C: No results found for: HGBA1C CBG: No results for input(s): GLUCAP in the last 168 hours.  Review of Systems:   All negative; except for those that are bolded, which indicate positives.  Constitutional: weight loss, weight gain, night sweats, fevers, chills, fatigue, weakness.  HEENT: headaches, sore throat, sneezing, nasal congestion, post nasal drip, difficulty swallowing, tooth/dental problems, visual complaints, visual changes, ear aches. Neuro: difficulty with speech, weakness, numbness, ataxia. CV:  chest pain, orthopnea, PND, swelling in lower extremities, dizziness, palpitations, syncope.  Resp: cough, hemoptysis, dyspnea, wheezing. GI: heartburn, indigestion, abdominal pain, nausea, vomiting, diarrhea, constipation, change in bowel habits, loss of appetite, hematemesis, melena, hematochezia.  GU: dysuria, change in color of urine, urgency or frequency, flank pain, hematuria. MSK: joint pain or swelling, decreased range of motion. Psych: change in mood or affect, depression, anxiety, suicidal ideations, homicidal ideations. Skin: rash, itching, bruising.   Past medical history  He,  has no past medical history on file.   Surgical History   Unknown.  Social History      Family history   His family history is not on file.   Allergies Not on File   Home meds  Prior to Admission medications   Not on File    Critical care time: 35 min.    Montey Hora, [facility] For pager details, please see AMION 01/26/2021, 2:07 AM

## 2021-01-27 ENCOUNTER — Encounter (HOSPITAL_COMMUNITY): Payer: Self-pay | Admitting: Pulmonary Disease

## 2021-01-27 DIAGNOSIS — I472 Ventricular tachycardia: Secondary | ICD-10-CM | POA: Diagnosis not present

## 2021-01-27 DIAGNOSIS — I429 Cardiomyopathy, unspecified: Secondary | ICD-10-CM | POA: Diagnosis not present

## 2021-01-27 DIAGNOSIS — R55 Syncope and collapse: Secondary | ICD-10-CM | POA: Diagnosis not present

## 2021-01-27 DIAGNOSIS — I609 Nontraumatic subarachnoid hemorrhage, unspecified: Secondary | ICD-10-CM

## 2021-01-27 DIAGNOSIS — I493 Ventricular premature depolarization: Secondary | ICD-10-CM

## 2021-01-27 MED ORDER — PANTOPRAZOLE SODIUM 40 MG PO TBEC
40.0000 mg | DELAYED_RELEASE_TABLET | Freq: Every day | ORAL | Status: DC
  Administered 2021-01-27 – 2021-02-05 (×10): 40 mg via ORAL
  Filled 2021-01-27 (×10): qty 1

## 2021-01-27 MED ORDER — CARVEDILOL 3.125 MG PO TABS
3.1250 mg | ORAL_TABLET | Freq: Two times a day (BID) | ORAL | Status: DC
  Administered 2021-01-27 – 2021-02-06 (×21): 3.125 mg via ORAL
  Filled 2021-01-27 (×25): qty 1

## 2021-01-27 MED ORDER — RESOURCE THICKENUP CLEAR PO POWD
ORAL | Status: DC | PRN
  Filled 2021-01-27: qty 125

## 2021-01-27 MED ORDER — LEVETIRACETAM 500 MG PO TABS
500.0000 mg | ORAL_TABLET | Freq: Two times a day (BID) | ORAL | Status: DC
  Administered 2021-01-27 – 2021-01-28 (×3): 500 mg via ORAL
  Filled 2021-01-27 (×6): qty 1

## 2021-01-27 MED ORDER — VITAMIN D 25 MCG (1000 UNIT) PO TABS
2000.0000 [IU] | ORAL_TABLET | Freq: Every day | ORAL | Status: DC
  Administered 2021-01-27 – 2021-02-05 (×10): 2000 [IU] via ORAL
  Filled 2021-01-27 (×9): qty 2

## 2021-01-27 MED ORDER — ENSURE ENLIVE PO LIQD
237.0000 mL | Freq: Two times a day (BID) | ORAL | Status: DC
  Administered 2021-01-28 – 2021-02-01 (×8): 237 mL via ORAL

## 2021-01-27 NOTE — Progress Notes (Addendum)
NAME:  Ryan Lynn, MRN:  [id number], DOB:  04-22-1934, LOS: 1 ADMISSION DATE:  01/25/2021, CONSULTATION DATE:  1/27/2 REFERRING MD:  Leonette Monarch  CHIEF COMPLAINT:  SAH   Brief History   87 yo male fell down stairs at home and brought to ER on 01/25/21.  Found to have [place] and Rt SDH.  PCCM asked to admit to ICU.  Past Medical History  SAH, Syncope, SDH, A fib, Parotic cancer s/p XRT, GERD, ED, Glaucoma bilateral, Aspiration pneumonia complicated by C diff in 2016  [place] [facility] Events   1/27 admit  Consults:  Neurosurgery Cardiology  Procedures:    Significant Diagnostic Tests:   CT head 1/26 > ICH with small right parietal SDH with mild mass effect, no MLS.  Mild SAH of medial right temporal lobe.  Echo 1/27 > EF 45%, grade 1 DD, RVSP 34.1 mmHg, aortic root 41 mm  Carotid duplex 1/27 > Right Carotid: Velocities in the right ICA are consistent with a 1-39% stenosis.  Left Carotid: Velocities in the left ICA are consistent with a 1-39% stenosis.  Vertebrals: Right vertebral artery demonstrates antegrade flow. Left vertebral artery was not visualized.   CT head 1/27 >> no change in Rt posterior SDH  Micro Data:  COVID 1/26 > negative MRSA PCR 1/27 >> negative  Antimicrobials:    Interim history/subjective:  Saw Dr. Mare Ferrari several yrs ago for PVCs.  Reports hx of PAF, but not on therapy for this.  No prior hx of systolic CHF.  Denies chest pain, dyspnea, headache.  Is on modified diet at home due to XRT therapy for parotid cancer.  Wife reports his swallowing has been getting worse and he has been losing weight.    Objective:  BP 140/77 (BP Location: Right Arm)   Pulse 100   Temp 98.8 F (37.1 C) (Oral)   Resp (!) 23   Ht 6' (1.829 m)   Wt 61.1 kg   SpO2 97%   BMI 18.27 kg/m   Intake/Output Summary (Last 24 hours) at 01/27/2021 0856 Last data filed at 01/27/2021 0800 Gross per 24 hour  Intake 1443.1 ml  Output 1460 ml  Net -16.9 ml   Filed Weights    01/25/21 2340 01/26/21 0640  Weight: 69.4 kg 61.1 kg    Examination:  General - alert, thin Eyes - pupils reactive ENT - no sinus tenderness, no stridor, garbled speech Cardiac - irregular, tachycardic Chest - equal breath sounds b/l, no wheezing or rales Abdomen - soft, non tender, + bowel sounds Extremities - no cyanosis, clubbing, or edema Skin - no rashes Neuro - normal strength, moves extremities, follows commands Psych - normal mood and behavior  Assessment & Plan:   Syncopal event with traumatic SDH and SAH. - seen by neurosurgery - imaging studies stable; can transfer out of ICU - day 1 of 7 for keppra per neurosurgery recommendation - might need outpt follow up with neurology (previously seen by Dr. Antony Contras)  Syncopal event with new onset systolic CHF. PAC, PVCs. Aortic root dilation. Reported hx of PAF. Hypertension. - add coreg - monitor on tele - goal K > 4, Mg > 2 - consult cardiology - goal SBP < 140  Dysphagia in setting of prior hx of parotic cancer s/p XRT. Cachexia. - speech therapy consult to assess swallowing - nutrition consult to assess caloric needs  - continue IV fluids until able to tolerate diet  Glaucoma. - continue eye drops  Deconditioning. - PT/OT  Best Practice (evaluated daily):  Diet: NPO, sips with meds DVT prophylaxis: SCD's GI prophylaxis: N/A. Mobility: OOB to chair Disposition: cardiac telemetry Code Status:  Full  Updated pt's wife at bedside  Transfer to telemetry on 1/28.  [facility] to assume primary care 1/29 and PCCM off.  Labs    CMP Latest Ref Rng & Units 01/26/2021 01/26/2021 01/25/2021  Glucose 70 - 99 mg/dL 131(H) 119(H) 123(H)  BUN 8 - 23 mg/dL 17 23 20   Creatinine 0.61 - 1.24 mg/dL 0.82 0.70 0.83  Sodium 135 - 145 mmol/L 133(L) 132(L) 133(L)  Potassium 3.5 - 5.1 mmol/L 4.0 4.1 4.1  Chloride 98 - 111 mmol/L 100 102 97(L)  CO2 22 - 32 mmol/L 25 - 22  Calcium 8.9 - 10.3 mg/dL 9.2 - 9.4  Total Protein  6.5 - 8.1 g/dL - - 6.9  Total Bilirubin 0.3 - 1.2 mg/dL - - 0.7  Alkaline Phos 38 - 126 U/L - - 91  AST 15 - 41 U/L - - 30  ALT 0 - 44 U/L - - 22    CBC Latest Ref Rng & Units 01/26/2021 01/26/2021 01/25/2021  WBC 4.0 - 10.5 K/uL 14.2(H) - 13.4(H)  Hemoglobin 13.0 - 17.0 g/dL 13.8 15.0 13.7  Hematocrit 39.0 - 52.0 % 41.7 44.0 40.6  Platelets 150 - 400 K/uL 209 - 195    CBG (last 3)  Recent Labs    01/26/21 0653 01/26/21 0735  GLUCAP 121* 135*    Signature:  Chesley Mires, MD [place] Pager - [phone] 01/27/2021, 9:27 AM

## 2021-01-27 NOTE — Consult Note (Signed)
Cardiology Consultation:   Patient ID: Ryan Lynn MRN: [id number]; DOB: 1934-12-06  Admit date: 01/25/2021 Date of Consult: 01/27/2021  Primary Care Provider: Shon Baton, MD [facility] Cardiologist: New (previously Dr. Mare Ferrari)  Patient Profile:   Ryan Lynn is a 87 y.o. 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 Halford Chessman.   Previously followed by Dr. Mare Ferrari for symptomatic palpitations/PVCs. No evidence of ischemic heart disease by Myoview stress test in 2011. Did not tolerated metoprolol. Symptoms improved on Bystolic. Last seen by Dr. Mare Ferrari in [id number] and DC Bystolic for trial. No follow up since then.   History of Present Illness:   Ryan Lynn presented after mechanical fall 01/25/21. Fell backward from stair while climbing up  at home. Had brief LOC and amnestic event. He was found to have ICH and right SDH. Seen by neurosurgery. Repeat scan with stable findings. He was also found to have atrial fibrillation with RVR. Anticoagulation avoided 2nd to acute SAH and SDH.   Hs-troponin and TSH are WNL. Echo 01/26/2021 1. Left ventricular ejection fraction, by estimation, is 45%. The left  ventricle has mildly decreased function. The left ventricle demonstrates  global hypokinesis. Left ventricular diastolic parameters are consistent  with Grade I diastolic dysfunction  (impaired relaxation).  2. Right ventricular systolic function is normal. The right ventricular  size is normal. There is normal pulmonary artery systolic pressure. The  estimated right ventricular systolic pressure is 123XX123 mmHg.  3. The mitral valve is normal in structure. No evidence of mitral valve  regurgitation. No evidence of mitral stenosis.  4. The aortic valve is tricuspid. Aortic valve regurgitation is not  visualized.  Mild aortic valve sclerosis is present, with no evidence of  aortic valve stenosis.  5. Aortic dilatation noted. There is mild dilatation of the aortic root,  measuring 41 mm.  6. The inferior vena cava is normal in size with greater than 50%  respiratory variability, suggesting right atrial pressure of 3 mmHg.   As above. Reviewed other chart. Pending merger. Prior hx of LOC/syncope 08/2020, likely due fall, work up showed small ICH.   Hx obtained from chart review and wife at bedside. Patient barley arousal. Discussed with nurse>> patient just moved from ICU. Was Alert this morning and did PT.  At baseline patient not very active at home, mostly sedentary lifestyle and uses cane.  No reported chest pain, palpitation, dizziness orthopnea, PND, lower extremity edema or melena to wife.  Inpatient Medications: Scheduled Meds: . carvedilol  3.125 mg Oral BID WC  . chlorhexidine  15 mL Mouth Rinse BID  . Chlorhexidine Gluconate Cloth  6 each Topical Daily  . cholecalciferol  2,000 Units Oral Daily  . dorzolamide  1 drop Both Eyes TID  . feeding supplement  237 mL Oral BID BM  . latanoprost  1 drop Both Eyes QHS  . levETIRAcetam  500 mg Oral BID  . mouth rinse  15 mL Mouth Rinse q12n4p  . pantoprazole  40 mg Oral Daily   Continuous Infusions: . lactated ringers 50 mL/hr at 01/27/21 1200   PRN Meds: docusate sodium, labetalol, polyethylene glycol, Resource ThickenUp Clear  Allergies:   Not on File  Social History:   Social History   Socioeconomic History  . Marital status: Married    Spouse name: Not  on file  . Number of children: Not on file  . Years of education: Not on file  . Highest education level: Not on file  Occupational History  . Not on file  Tobacco Use  . Smoking status: Not on file  . Smokeless tobacco: Not on file  Substance and Sexual Activity  . Alcohol use: Not on file  . Drug use: Not on file  . Sexual activity: Not on file  Other Topics Concern  .  Not on file  Social History Narrative  . Not on file   Social Determinants of Health   Financial Resource Strain: Not on file  Food Insecurity: Not on file  Transportation Needs: Not on file  Physical Activity: Not on file  Stress: Not on file  Social Connections: Not on file  Intimate Partner Violence: Not on file    Family History:   History reviewed. No pertinent family history.  From other chart Family History  Problem Relation Age of Onset  . Heart failure Mother   . Cancer Father     ROS:  Please see the history of present illness.  All other ROS reviewed and negative.     Physical Exam/Data:   Vitals:   01/27/21 1000 01/27/21 1100 01/27/21 1125 01/27/21 1200  BP: 120/76 (!) 142/82 (!) 142/82 (!) 142/86  Pulse: (!) 108 (!) 106 (!) 107 (!) 115  Resp: (!) 25 (!) 25  (!) 28  Temp:      TempSrc:      SpO2: 97% 96%  97%  Weight:      Height:        Intake/Output Summary (Last 24 hours) at 01/27/2021 1330 Last data filed at 01/27/2021 1210 Gross per 24 hour  Intake 1123.72 ml  Output 895 ml  Net 228.72 ml   Last 3 Weights 01/26/2021 01/25/2021  Weight (lbs) 134 lb 11.2 oz 153 lb  Weight (kg) 61.1 kg 69.4 kg     Body mass index is 18.27 kg/m.  General:  Chronically ill, pale and thin appearing male HEENT: open mouth Lymph: no adenopathy Neck: no JVD Endocrine:  No thryomegaly Vascular: No carotid bruits; FA pulses 2+ bilaterally without bruits  Cardiac:  normal S1, S2; Irregular tachycardic ; no murmur  Lungs:  clear to auscultation bilaterally, no wheezing, rhonchi or rales  Abd: soft, nontender, no hepatomegaly  Ext: no edema Musculoskeletal:  No deformities,  Skin: warm and dry  Neuro:  Only arousal to stimuli, but right goes back to sleep  Psych:  Lethargic, drowsy   EKG:  The EKG was personally reviewed and demonstrates:  EGK read as Atrial fibrillation, HR 122, PVCs>> however rhythm looks like sinus with abbrancy Telemetry:  Telemetry was  personally reviewed and demonstrates:  SR, NSVT, PVCs, PACs  Relevant CV Studies: As above   Laboratory Data:  High Sensitivity Troponin:   Recent Labs  Lab 01/25/21 2345 01/26/21 0208  TROPONINIHS 6 9     Chemistry Recent Labs  Lab 01/25/21 2345 01/26/21 0001 01/26/21 0500  NA 133* 132* 133*  K 4.1 4.1 4.0  CL 97* 102 100  CO2 22  --  25  GLUCOSE 123* 119* 131*  BUN 20 23 17   CREATININE 0.83 0.70 0.82  CALCIUM 9.4  --  9.2  GFRNONAA >60  --  >60  ANIONGAP 14  --  8    Recent Labs  Lab 01/25/21 2345  PROT 6.9  ALBUMIN 4.1  AST 30  ALT 22  ALKPHOS 91  BILITOT 0.7   Hematology Recent Labs  Lab 01/25/21 2345 01/26/21 0001 01/26/21 0500  WBC 13.4*  --  14.2*  RBC 4.26  --  4.41  HGB 13.7 15.0 13.8  HCT 40.6 44.0 41.7  MCV 95.3  --  94.6  MCH 32.2  --  31.3  MCHC 33.7  --  33.1  RDW 13.0  --  12.9  PLT 195  --  209   BNP Recent Labs  Lab 01/25/21 2349  BNP 171.8*    Radiology/Studies:  CT HEAD WO CONTRAST  Result Date: 01/26/2021 CLINICAL DATA:  Subdural hematoma follow-up EXAM: CT HEAD WITHOUT CONTRAST TECHNIQUE: Contiguous axial images were obtained from the base of the skull through the vertex without intravenous contrast. COMPARISON:  01/26/2021 at 12:04 a.m. FINDINGS: Brain: Unchanged right posterior subdural hematoma measuring 7 mm in thickness, allowing for redistribution. There is no midline shift or other mass effect. No new site of hemorrhage. Vascular: No abnormal hyperdensity of the major intracranial arteries or dural venous sinuses. No intracranial atherosclerosis. Skull: The visualized skull base, calvarium and extracranial soft tissues are normal. Sinuses/Orbits: No fluid levels or advanced mucosal thickening of the visualized paranasal sinuses. No mastoid or middle ear effusion. The orbits are normal. IMPRESSION: Unchanged size of right posterior subdural hematoma, allowing for redistribution. No mass effect. Electronically Signed   By:  Ulyses Jarred M.D.   On: 01/26/2021 20:50   CT HEAD WO CONTRAST  Result Date: 01/26/2021 CLINICAL DATA:  Fall, head injury, neck injury, chronic anticoagulation EXAM: CT HEAD WITHOUT CONTRAST CT CERVICAL SPINE WITHOUT CONTRAST TECHNIQUE: Multidetector CT imaging of the head and cervical spine was performed following the standard protocol without intravenous contrast. Multiplanar CT image reconstructions of the cervical spine were also generated. COMPARISON:  MRI head 09/17/2020, CT neck 04/01/2017 FINDINGS: CT HEAD FINDINGS Brain: There is normal anatomic configuration of the brain. A small right subdural hematoma is seen along the right parietal convexity measuring 8-9 mm in thickness on coronal image # 47 and demonstrating mild mass effect upon the right cerebral hemisphere. There is no associated midline shift. Additionally, there is a small focus of subarachnoid hemorrhage seen within the sulcus of the hippocampal gyrus, best noted on coronal image # 43. No acute infarct. Cerebellum unremarkable. Ventricular size is normal. No abnormal intra or extra-axial mass lesion. Previously noted hyperdensity within the pons and inferior left temporal lobe have resolved in the interval since the prior examination in keeping with resolved hemorrhage. Vascular: No asymmetric hyperdense vasculature at the skull base. Skull: The calvarium is intact Sinuses/Orbits: Mild mucosal thickening within the a left maxillary sinus and frontal sinuses with opacification of several left ethmoid air cells. The orbits are unremarkable Other: Small right occipital scalp hematoma with punctate focus of subcutaneous gas in keeping with direct trauma. There is fluid opacification of several inferior left mastoid air cells with coalescence of the air cells in keeping with changes of chronic mastoiditis. Middle ear cavities and right mastoid air cells are clear. CT CERVICAL SPINE FINDINGS Alignment: There is reversal of the normal cervical  lordosis from C2-C6 likely degenerative in nature. No listhesis. Skull base and vertebrae: The craniocervical junction is unremarkable. The atlantodental interval is normal. No acute fracture of the cervical spine. Soft tissues and spinal canal: Posterior disc osteophyte complex at C4-5 results in moderate to severe central canal stenosis with an AP diameter of the spinal canal of 6-7 mm and resultant flattening of the thecal sac.  Posterior disc osteophyte complex ease at C3-4 and C5-6 results in mild central canal stenosis. No canal hematoma. No prevertebral soft tissue swelling. No paraspinal fluid collections are identified. Disc levels: Review of the sagittal images demonstrates intervertebral disc space narrowing and endplate remodeling throughout the cervical spine, most severe at C4-C6 in keeping with changes of severe degenerative disc disease. Vertebral body height has been preserved. Review of the axial images demonstrates multilevel uncovertebral and facet arthrosis resulting in multilevel moderate to severe neural foraminal narrowing, most severe on the right at C2-3, C3-4, C4-5, and C5-6 and on the left at C5-6. Upper chest: Biapical scarring and left apical calcified pleural plaques are identified. Other: Thyroid unremarkable IMPRESSION: Acute intracranial hemorrhage with small right parietal subdural hematoma demonstrating mild mass effect upon the right cerebral hemisphere. No midline shift. Additionally, mild subarachnoid hemorrhage is seen involving the medial right temporal lobe. Previously noted foci of hyperattenuation involving the pons and left temporal lobe have resolved in keeping with hemorrhage at that time. Fluid opacification of the inferior left mastoid air cells with coalescence of the air cells in keeping with chronic left mastoiditis. Mild paranasal sinus disease. No acute fracture or listhesis of the cervical spine. Degenerative disc and degenerative joint disease resulting in  moderate to severe central canal stenosis at C4-5 and multilevel neural foraminal narrowing as described above. These results were called by telephone at the time of interpretation on 01/26/2021 at 12:36 am to provider [facility] , who verbally acknowledged these results. Electronically Signed   By: Fidela Salisbury MD   On: 01/26/2021 00:39   CT CERVICAL SPINE WO CONTRAST  Result Date: 01/26/2021 CLINICAL DATA:  Fall, head injury, neck injury, chronic anticoagulation EXAM: CT HEAD WITHOUT CONTRAST CT CERVICAL SPINE WITHOUT CONTRAST TECHNIQUE: Multidetector CT imaging of the head and cervical spine was performed following the standard protocol without intravenous contrast. Multiplanar CT image reconstructions of the cervical spine were also generated. COMPARISON:  MRI head 09/17/2020, CT neck 04/01/2017 FINDINGS: CT HEAD FINDINGS Brain: There is normal anatomic configuration of the brain. A small right subdural hematoma is seen along the right parietal convexity measuring 8-9 mm in thickness on coronal image # 47 and demonstrating mild mass effect upon the right cerebral hemisphere. There is no associated midline shift. Additionally, there is a small focus of subarachnoid hemorrhage seen within the sulcus of the hippocampal gyrus, best noted on coronal image # 43. No acute infarct. Cerebellum unremarkable. Ventricular size is normal. No abnormal intra or extra-axial mass lesion. Previously noted hyperdensity within the pons and inferior left temporal lobe have resolved in the interval since the prior examination in keeping with resolved hemorrhage. Vascular: No asymmetric hyperdense vasculature at the skull base. Skull: The calvarium is intact Sinuses/Orbits: Mild mucosal thickening within the a left maxillary sinus and frontal sinuses with opacification of several left ethmoid air cells. The orbits are unremarkable Other: Small right occipital scalp hematoma with punctate focus of subcutaneous gas in keeping  with direct trauma. There is fluid opacification of several inferior left mastoid air cells with coalescence of the air cells in keeping with changes of chronic mastoiditis. Middle ear cavities and right mastoid air cells are clear. CT CERVICAL SPINE FINDINGS Alignment: There is reversal of the normal cervical lordosis from C2-C6 likely degenerative in nature. No listhesis. Skull base and vertebrae: The craniocervical junction is unremarkable. The atlantodental interval is normal. No acute fracture of the cervical spine. Soft tissues and spinal canal: Posterior disc osteophyte complex  at C4-5 results in moderate to severe central canal stenosis with an AP diameter of the spinal canal of 6-7 mm and resultant flattening of the thecal sac. Posterior disc osteophyte complex ease at C3-4 and C5-6 results in mild central canal stenosis. No canal hematoma. No prevertebral soft tissue swelling. No paraspinal fluid collections are identified. Disc levels: Review of the sagittal images demonstrates intervertebral disc space narrowing and endplate remodeling throughout the cervical spine, most severe at C4-C6 in keeping with changes of severe degenerative disc disease. Vertebral body height has been preserved. Review of the axial images demonstrates multilevel uncovertebral and facet arthrosis resulting in multilevel moderate to severe neural foraminal narrowing, most severe on the right at C2-3, C3-4, C4-5, and C5-6 and on the left at C5-6. Upper chest: Biapical scarring and left apical calcified pleural plaques are identified. Other: Thyroid unremarkable IMPRESSION: Acute intracranial hemorrhage with small right parietal subdural hematoma demonstrating mild mass effect upon the right cerebral hemisphere. No midline shift. Additionally, mild subarachnoid hemorrhage is seen involving the medial right temporal lobe. Previously noted foci of hyperattenuation involving the pons and left temporal lobe have resolved in keeping with  hemorrhage at that time. Fluid opacification of the inferior left mastoid air cells with coalescence of the air cells in keeping with chronic left mastoiditis. Mild paranasal sinus disease. No acute fracture or listhesis of the cervical spine. Degenerative disc and degenerative joint disease resulting in moderate to severe central canal stenosis at C4-5 and multilevel neural foraminal narrowing as described above. These results were called by telephone at the time of interpretation on 01/26/2021 at 12:36 am to provider [facility] , who verbally acknowledged these results. Electronically Signed   By: Fidela Salisbury MD   On: 01/26/2021 00:39   DG Pelvis Portable  Result Date: 01/25/2021 CLINICAL DATA:  Fall EXAM: PORTABLE PELVIS 1-2 VIEWS COMPARISON:  None. FINDINGS: SI joints are non widened. Pubic symphysis and rami appear intact. No acute displaced fracture or malalignment. IMPRESSION: Negative. Electronically Signed   By: Donavan Foil M.D.   On: 01/25/2021 23:52   DG Chest Port 1 View  Result Date: 01/25/2021 CLINICAL DATA:  Fall EXAM: PORTABLE CHEST 1 VIEW COMPARISON:  09/17/2020 FINDINGS: Hyperinflated lungs. No acute consolidation or effusion. Normal cardiac size. Slight asymmetric irregular opacity in the right hilus. No pneumothorax. IMPRESSION: No acute infiltrate or edema. Slight asymmetric irregular opacity in the right hilus, uncertain if this is due to summation artifact and rotation versus hilar abnormality. Correlation with chest CT could be considered. Electronically Signed   By: Donavan Foil M.D.   On: 01/25/2021 23:54   ECHOCARDIOGRAM COMPLETE  Result Date: 01/26/2021    ECHOCARDIOGRAM REPORT   Patient Name:   CAMIL TREMBATH Date of Exam: 01/26/2021 Medical Rec #:  [id number]    Height:       72.0 in Accession #:    [id number]   Weight:       134.7 lb Date of Birth:  [birth year]     BSA:          1.801 m Patient Age:    53 years     BP:           128/73 mmHg Patient Gender: M             HR:           93 bpm. Exam Location:  Inpatient Procedure: 2D Echo, Color Doppler and Cardiac Doppler Indications:    Premature ventricular contraction  History:        Patient has no prior history of Echocardiogram examinations.                 Arrythmias:Atrial Fibrillation; Signs/Symptoms:Syncope.  Sonographer:    Clayton Lefort RDCS (AE) Referring Phys: [id number] [facility]  Sonographer Comments: Suboptimal parasternal window. IMPRESSIONS  1. Left ventricular ejection fraction, by estimation, is 45%. The left ventricle has mildly decreased function. The left ventricle demonstrates global hypokinesis. Left ventricular diastolic parameters are consistent with Grade I diastolic dysfunction (impaired relaxation).  2. Right ventricular systolic function is normal. The right ventricular size is normal. There is normal pulmonary artery systolic pressure. The estimated right ventricular systolic pressure is 123XX123 mmHg.  3. The mitral valve is normal in structure. No evidence of mitral valve regurgitation. No evidence of mitral stenosis.  4. The aortic valve is tricuspid. Aortic valve regurgitation is not visualized. Mild aortic valve sclerosis is present, with no evidence of aortic valve stenosis.  5. Aortic dilatation noted. There is mild dilatation of the aortic root, measuring 41 mm.  6. The inferior vena cava is normal in size with greater than 50% respiratory variability, suggesting right atrial pressure of 3 mmHg. FINDINGS  Left Ventricle: Left ventricular ejection fraction, by estimation, is 45%. The left ventricle has mildly decreased function. The left ventricle demonstrates global hypokinesis. The left ventricular internal cavity size was normal in size. There is no left ventricular hypertrophy. Left ventricular diastolic parameters are consistent with Grade I diastolic dysfunction (impaired relaxation). Right Ventricle: The right ventricular size is normal. No increase in right ventricular wall thickness. Right  ventricular systolic function is normal. There is normal pulmonary artery systolic pressure. The tricuspid regurgitant velocity is 2.79 m/s, and  with an assumed right atrial pressure of 3 mmHg, the estimated right ventricular systolic pressure is 123XX123 mmHg. Left Atrium: Left atrial size was normal in size. Right Atrium: Right atrial size was normal in size. Pericardium: There is no evidence of pericardial effusion. Mitral Valve: The mitral valve is normal in structure. There is mild calcification of the mitral valve leaflet(s). No evidence of mitral valve regurgitation. No evidence of mitral valve stenosis. Tricuspid Valve: The tricuspid valve is normal in structure. Tricuspid valve regurgitation is trivial. Aortic Valve: The aortic valve is tricuspid. Aortic valve regurgitation is not visualized. Mild aortic valve sclerosis is present, with no evidence of aortic valve stenosis. Aortic valve mean gradient measures 2.0 mmHg. Aortic valve peak gradient measures 3.2 mmHg. Aortic valve area, by VTI measures 2.60 cm. Pulmonic Valve: The pulmonic valve was normal in structure. Pulmonic valve regurgitation is not visualized. Aorta: Aortic dilatation noted. There is mild dilatation of the aortic root, measuring 41 mm. Venous: The inferior vena cava is normal in size with greater than 50% respiratory variability, suggesting right atrial pressure of 3 mmHg. IAS/Shunts: No atrial level shunt detected by color flow Doppler.  LEFT VENTRICLE PLAX 2D LVIDd:         4.70 cm LVIDs:         3.80 cm LV PW:         0.90 cm LV IVS:        0.90 cm LVOT diam:     2.00 cm     3D Volume EF: LV SV:         44          3D EF:        53 % LV SV Index:   24  LV EDV:       127 ml LVOT Area:     3.14 cm    LV ESV:       60 ml                            LV SV:        67 ml  LV Volumes (MOD) LV vol d, MOD A2C: 91.9 ml LV vol d, MOD A4C: 97.3 ml LV vol s, MOD A2C: 48.9 ml LV vol s, MOD A4C: 50.2 ml LV SV MOD A2C:     43.0 ml LV SV MOD  A4C:     97.3 ml LV SV MOD BP:      43.4 ml RIGHT VENTRICLE             IVC RV Basal diam:  3.30 cm     IVC diam: 1.40 cm RV S prime:     11.10 cm/s TAPSE (M-mode): 3.0 cm LEFT ATRIUM           Index       RIGHT ATRIUM           Index LA diam:      1.30 cm 0.72 cm/m  RA Area:     13.20 cm LA Vol (A2C): 16.7 ml 9.27 ml/m  RA Volume:   29.70 ml  16.49 ml/m LA Vol (A4C): 20.9 ml 11.60 ml/m  AORTIC VALVE AV Area (Vmax):    2.53 cm AV Area (Vmean):   2.36 cm AV Area (VTI):     2.60 cm AV Vmax:           89.20 cm/s AV Vmean:          66.800 cm/s AV VTI:            0.168 m AV Peak Grad:      3.2 mmHg AV Mean Grad:      2.0 mmHg LVOT Vmax:         71.90 cm/s LVOT Vmean:        50.200 cm/s LVOT VTI:          0.139 m LVOT/AV VTI ratio: 0.83  AORTA Ao Root diam: 4.10 cm Ao Asc diam:  3.90 cm TRICUSPID VALVE TR Peak grad:   31.1 mmHg TR Vmax:        279.00 cm/s  SHUNTS Systemic VTI:  0.14 m Systemic Diam: 2.00 cm Ryan Champagne MD Electronically signed by Ryan Champagne MD Signature Date/Time: 01/26/2021/6:32:34 PM    Final    VAS US CAROTID  Result Date: 01/26/2021 Carotid Arterial Duplex Study Indications:       Syncope. Comparison Study:  no prior Performing Technologist: Abram Sander RVS  Examination Guidelines: A complete evaluation includes B-mode imaging, spectral Doppler, color Doppler, and power Doppler as needed of all accessible portions of each vessel. Bilateral testing is considered an integral part of a complete examination. Limited examinations for reoccurring indications may be performed as noted.  Right Carotid Findings: +----------+--------+--------+--------+------------------+--------+           PSV cm/sEDV cm/sStenosisPlaque DescriptionComments +----------+--------+--------+--------+------------------+--------+ CCA Prox  66      9               heterogenous               +----------+--------+--------+--------+------------------+--------+ CCA Distal75      14  heterogenous               +----------+--------+--------+--------+------------------+--------+ ICA Prox  51      13      1-39%   heterogenous               +----------+--------+--------+--------+------------------+--------+ ICA Distal55      12                                         +----------+--------+--------+--------+------------------+--------+ ECA       75      7                                          +----------+--------+--------+--------+------------------+--------+ +----------+--------+-------+--------+-------------------+           PSV cm/sEDV cmsDescribeArm Pressure (mmHG) +----------+--------+-------+--------+-------------------+ [id number]                                        +----------+--------+-------+--------+-------------------+ +---------+--------+--+--------+-+---------+ VertebralPSV cm/s29EDV cm/s9Antegrade +---------+--------+--+--------+-+---------+  Left Carotid Findings: +----------+--------+--------+--------+------------------+--------+           PSV cm/sEDV cm/sStenosisPlaque DescriptionComments +----------+--------+--------+--------+------------------+--------+ CCA Prox  56      11              heterogenous               +----------+--------+--------+--------+------------------+--------+ CCA Distal85      15              heterogenous               +----------+--------+--------+--------+------------------+--------+ ICA Prox  52      13      1-39%   heterogenous               +----------+--------+--------+--------+------------------+--------+ ICA Distal58      13                                         +----------+--------+--------+--------+------------------+--------+ ECA       91      13                                         +----------+--------+--------+--------+------------------+--------+ +----------+--------+--------+--------+-------------------+           PSV cm/sEDV cm/sDescribeArm  Pressure (mmHG) +----------+--------+--------+--------+-------------------+ [id number]                                          +----------+--------+--------+--------+-------------------+ +---------+--------+--------+--------------+ VertebralPSV cm/sEDV cm/sNot identified +---------+--------+--------+--------------+   Summary: Right Carotid: Velocities in the right ICA are consistent with a 1-39% stenosis. Left Carotid: Velocities in the left ICA are consistent with a 1-39% stenosis. Vertebrals: Right vertebral artery demonstrates antegrade flow. Left vertebral             artery was not visualized. *See table(s) above for measurements and observations.  Electronically signed by Servando Snare MD on 01/26/2021 at 4:43:41 PM.    Final    Assessment  and Plan:   1. Cardiomyopathy - Echo this admission showed LVEF of 45%, global hypokinesis and grade 1 DD. Normal RV function. Mild dilatation of the aortic root at 15mm.  - This could be due to frequent PVCs  -On Coreg here  2. Arrhythmias/ PVCs - Initial EKG read as afib but clearly visible some P wave. Review of telemetry from ER admit showing sinus rhythm, initially has brief NSVT, then frequent PVCs with aberrancy. No evidence of afib.  -Prior history of symptomatic palpitations secondary to PVC.  He did not tolerated metoprolol in the past.  He did well with improved symptoms on Bystolic which was discontinued when last seen by Dr. Mare Ferrari in 2015 trial basis. -Could be contributing to his cardiomyopathy -Will review beta-blocker with MD. Currently on Coreg 3.125mg  BID.  If recovers well may consider repeat monitor for PVCs burden.  - Keep K > 4 & MG > 2  3.  Aortic root dilatation -Echo showed aortic root dilatation of 41 mm. -Follow with routine study  4.  Questionable syncope -Likely due to traumatic event. He was at his baseline prior to fall per wife.   5.  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

## 2021-01-27 NOTE — Evaluation (Signed)
Physical Therapy Evaluation Patient Details Name: Ryan Lynn MRN: [id number] DOB: November 03, 1934 Today's Date: 01/27/2021   History of Present Illness  87 yo admitted 1/26 after fall down stairs possibly with syncope. Pt with new Afib with RVR with small right SDH, SAH. PMhx: I[facility] in 2021  Clinical Impression  Pt pleasant, HOH with impaired speech since radiation in 1988. Pt reports LUE weakness at baseline due to radiation damage as well and need for obturator with speech and swallowing. Pt with decreased awareness, balance, transfers and gait who reports 2 falls in the last few months. Pt will benefit from acute therapy to maximize mobility, safety and function to decrease burden of care. Pt and wife educated for benefit of RW at present.   HR 105 SpO2 95% RA BP 134/88    Follow Up Recommendations CIR;Supervision/Assistance - 24 hour    Equipment Recommendations  Rolling walker with 5" wheels    Recommendations for Other Services       Precautions / Restrictions Precautions Precautions: Fall Precaution Comments: obturator for eating, HOH speak to right ear Restrictions Weight Bearing Restrictions: No      Mobility  Bed Mobility Overal bed mobility: Needs Assistance Bed Mobility: Supine to Sit     Supine to sit: Min guard;HOB elevated     General bed mobility comments: increased time; leaning posteriorly at times, HOB 25 degrees    Transfers Overall transfer level: Needs assistance Equipment used: Rolling walker (2 wheeled) Transfers: Sit to/from [facility] Sit to Stand: +2 safety/equipment;Min assist Stand pivot transfers: Min assist;+2 physical assistance       General transfer comment: cues for hand placement with min assist to rise and steady with RW present  Ambulation/Gait Ambulation/Gait assistance: Min assist Gait Distance (Feet): 12 Feet Assistive device: Rolling walker (2 wheeled) Gait Pattern/deviations: Step-through pattern;Decreased  stride length;Shuffle;Trunk flexed   Gait velocity interpretation: <1.8 ft/sec, indicate of risk for recurrent falls General Gait Details: short, slow, steps with flexed trunk and cues for posture and direction. pt with posterior bias with assist for balance intermittently  Stairs            Wheelchair Mobility    Modified Rankin (Stroke Patients Only)       Balance Overall balance assessment: History of Falls;Needs assistance   Sitting balance-Leahy Scale: Fair Sitting balance - Comments: EOB without UE support with guarding Postural control: Posterior lean;Other (comment) (L bias)   Standing balance-Leahy Scale: Poor Standing balance comment: reliant on external support which is not his baseline, without support posterior left lean                             Pertinent Vitals/Pain Pain Assessment: No/denies pain Faces Pain Scale: No hurt Pain Location: L thumb Pain Descriptors / Indicators: Discomfort Pain Intervention(s): Limited activity within patient's tolerance    Home Living Family/patient expects to be discharged to:: Private residence Living Arrangements: Spouse/significant other Available Help at Discharge: Family;Available 24 hours/day Type of Home: House Home Access: Stairs to enter Entrance Stairs-Rails: Right;Left Entrance Stairs-Number of Steps: 5 (17 on deck) Home Layout: Two level;Multi-level;1/2 bath on main level;Able to live on main level with bedroom/bathroom;Bed/bath upstairs Home Equipment: Cane - single point      Prior Function Level of Independence: Independent with assistive device(s)         Comments: used cane outdoors and in community (ises Obtuator due to past mouth surgery/radiation)  Hand Dominance   Dominant Hand: Right    Extremity/Trunk Assessment   Upper Extremity Assessment Upper Extremity Assessment: Defer to OT evaluation     Lower Extremity Assessment Lower Extremity Assessment: Generalized  weakness    Cervical / Trunk Assessment Cervical / Trunk Assessment: Kyphotic  Communication   Communication: HOH (hears better in R ear)  Cognition Arousal/Alertness: Awake/alert Behavior During Therapy: WFL for tasks assessed/performed Overall Cognitive Status: Impaired/Different from baseline Area of Impairment: Attention;Memory;Safety/judgement;Awareness;Problem solving;Following commands                   Current Attention Level: Sustained Memory: Decreased short-term memory Following Commands: Follows one step commands with increased time Safety/Judgement: Decreased awareness of safety;Decreased awareness of deficits Awareness: Emergent Problem Solving: Slow processing        General Comments     Exercises     Assessment/Plan    PT Assessment Patient needs continued PT services  PT Problem List Decreased mobility;Decreased activity tolerance;Decreased balance;Decreased knowledge of use of DME;Decreased cognition;Decreased safety awareness       PT Treatment Interventions Gait training;Balance training;Stair training;Functional mobility training;Therapeutic activities;Cognitive remediation;Patient/family education;Neuromuscular re-education;DME instruction;Therapeutic exercise    PT Goals (Current goals can be found in the Care Plan section)  Acute Rehab PT Goals Patient Stated Goal: to get better and go home PT Goal Formulation: With patient/family Time For Goal Achievement: 02/10/21 Potential to Achieve Goals: Fair    Frequency Min 3X/week   Barriers to discharge        Co-evaluation PT/OT/SLP Co-Evaluation/Treatment: Yes Reason for Co-Treatment: For patient/therapist safety PT goals addressed during session: Mobility/safety with mobility        AM-PAC PT "6 Clicks" Mobility  Outcome Measure Help needed turning from your back to your side while in a flat bed without using bedrails?: A Little Help needed moving from lying on your back to  sitting on the side of a flat bed without using bedrails?: A Little Help needed moving to and from a bed to a chair (including a wheelchair)?: A Little Help needed standing up from a chair using your arms (e.g., wheelchair or bedside chair)?: A Little Help needed to walk in hospital room?: A Little Help needed climbing 3-5 steps with a railing? : A Lot 6 Click Score: 17    End of Session Equipment Utilized During Treatment: Gait belt Activity Tolerance: Patient tolerated treatment well Patient left: in chair;with call bell/phone within reach;with family/visitor present;with chair alarm set Nurse Communication: Mobility status PT Visit Diagnosis: Other abnormalities of gait and mobility (R26.89);Difficulty in walking, not elsewhere classified (R26.2);Unsteadiness on feet (R26.81);History of falling (Z91.81)    Time: 5400-8676 PT Time Calculation (min) (ACUTE ONLY): 34 min   Charges:   PT Evaluation $PT Eval Moderate Complexity: 1 Mod          Jozlin Bently P, PT Acute Rehabilitation Services Pager: [phone] Office: [phone]   Maico Mulvehill B Karma Ansley 01/27/2021, 11:13 AM

## 2021-01-27 NOTE — Progress Notes (Addendum)
Subjective: Patient reports no headaches  Objective: Vital signs in last 24 hours: Temp:  [98 F (36.7 C)-100.9 F (38.3 C)] 98.7 F (37.1 C) (01/28 1559) Pulse Rate:  [83-115] 96 (01/28 1559) Resp:  [16-29] 20 (01/28 1559) BP: (113-158)/(62-86) 157/79 (01/28 1559) SpO2:  [93 %-100 %] 99 % (01/28 1559)  Intake/Output from previous day: 01/27 0701 - 01/28 0700 In: 1406.7 [I.V.:1406.7] Out: 1460 [Urine:1460] Intake/Output this shift: Total I/O In: 249.9 [I.V.:249.9] Out: 335 [Urine:335]    General : Alert, cooperative, no distress, appears stated age   Head:  Small ecchymosis   Eyes: PERRL, conjunctiva/corneas clear, EOM's intact. Fundi could not be visualized Neck: Supple Chest:  Respirations unlabored Chest wall: no tenderness or deformity Heart: Irregular rate Abdomen: Soft, nontender and nondistended Extremities: warm and well-perfused Skin: normal turgor, color and texture Neurologic:  Alert, oriented x 3.  Affect flat, speech slow.  Eyes open spontaneously. PERRL, EOMI, VFC, no facial droop. V1-3 intact.  No dysarthria, tongue protrusion symmetric.  CNII-XII intact. Normal strength, sensation and reflexes throughout.  No pronator drift, full strength in legs       Lab Results: Recent Labs    01/25/21 2345 01/26/21 0001 01/26/21 0500  WBC 13.4*  --  14.2*  HGB 13.7 15.0 13.8  HCT 40.6 44.0 41.7  PLT 195  --  209   BMET Recent Labs    01/25/21 2345 01/26/21 0001 01/26/21 0500  NA 133* 132* 133*  K 4.1 4.1 4.0  CL 97* 102 100  CO2 22  --  25  GLUCOSE 123* 119* 131*  BUN 20 23 17   CREATININE 0.83 0.70 0.82  CALCIUM 9.4  --  9.2    Studies/Results: CT HEAD WO CONTRAST  Result Date: 01/26/2021 CLINICAL DATA:  Subdural hematoma follow-up EXAM: CT HEAD WITHOUT CONTRAST TECHNIQUE: Contiguous axial images were obtained from the base of the skull through the vertex without intravenous contrast. COMPARISON:  01/26/2021 at 12:04 a.m. FINDINGS: Brain:  Unchanged right posterior subdural hematoma measuring 7 mm in thickness, allowing for redistribution. There is no midline shift or other mass effect. No new site of hemorrhage. Vascular: No abnormal hyperdensity of the major intracranial arteries or dural venous sinuses. No intracranial atherosclerosis. Skull: The visualized skull base, calvarium and extracranial soft tissues are normal. Sinuses/Orbits: No fluid levels or advanced mucosal thickening of the visualized paranasal sinuses. No mastoid or middle ear effusion. The orbits are normal. IMPRESSION: Unchanged size of right posterior subdural hematoma, allowing for redistribution. No mass effect. Electronically Signed   By: Ulyses Jarred M.D.   On: 01/26/2021 20:50   CT HEAD WO CONTRAST  Result Date: 01/26/2021 CLINICAL DATA:  Fall, head injury, neck injury, chronic anticoagulation EXAM: CT HEAD WITHOUT CONTRAST CT CERVICAL SPINE WITHOUT CONTRAST TECHNIQUE: Multidetector CT imaging of the head and cervical spine was performed following the standard protocol without intravenous contrast. Multiplanar CT image reconstructions of the cervical spine were also generated. COMPARISON:  MRI head 09/17/2020, CT neck 04/01/2017 FINDINGS: CT HEAD FINDINGS Brain: There is normal anatomic configuration of the brain. A small right subdural hematoma is seen along the right parietal convexity measuring 8-9 mm in thickness on coronal image # 47 and demonstrating mild mass effect upon the right cerebral hemisphere. There is no associated midline shift. Additionally, there is a small focus of subarachnoid hemorrhage seen within the sulcus of the hippocampal gyrus, best noted on coronal image # 43. No acute infarct. Cerebellum unremarkable. Ventricular size is normal. No abnormal  intra or extra-axial mass lesion. Previously noted hyperdensity within the pons and inferior left temporal lobe have resolved in the interval since the prior examination in keeping with resolved  hemorrhage. Vascular: No asymmetric hyperdense vasculature at the skull base. Skull: The calvarium is intact Sinuses/Orbits: Mild mucosal thickening within the a left maxillary sinus and frontal sinuses with opacification of several left ethmoid air cells. The orbits are unremarkable Other: Small right occipital scalp hematoma with punctate focus of subcutaneous gas in keeping with direct trauma. There is fluid opacification of several inferior left mastoid air cells with coalescence of the air cells in keeping with changes of chronic mastoiditis. Middle ear cavities and right mastoid air cells are clear. CT CERVICAL SPINE FINDINGS Alignment: There is reversal of the normal cervical lordosis from C2-C6 likely degenerative in nature. No listhesis. Skull base and vertebrae: The craniocervical junction is unremarkable. The atlantodental interval is normal. No acute fracture of the cervical spine. Soft tissues and spinal canal: Posterior disc osteophyte complex at C4-5 results in moderate to severe central canal stenosis with an AP diameter of the spinal canal of 6-7 mm and resultant flattening of the thecal sac. Posterior disc osteophyte complex ease at C3-4 and C5-6 results in mild central canal stenosis. No canal hematoma. No prevertebral soft tissue swelling. No paraspinal fluid collections are identified. Disc levels: Review of the sagittal images demonstrates intervertebral disc space narrowing and endplate remodeling throughout the cervical spine, most severe at C4-C6 in keeping with changes of severe degenerative disc disease. Vertebral body height has been preserved. Review of the axial images demonstrates multilevel uncovertebral and facet arthrosis resulting in multilevel moderate to severe neural foraminal narrowing, most severe on the right at C2-3, C3-4, C4-5, and C5-6 and on the left at C5-6. Upper chest: Biapical scarring and left apical calcified pleural plaques are identified. Other: Thyroid  unremarkable IMPRESSION: Acute intracranial hemorrhage with small right parietal subdural hematoma demonstrating mild mass effect upon the right cerebral hemisphere. No midline shift. Additionally, mild subarachnoid hemorrhage is seen involving the medial right temporal lobe. Previously noted foci of hyperattenuation involving the pons and left temporal lobe have resolved in keeping with hemorrhage at that time. Fluid opacification of the inferior left mastoid air cells with coalescence of the air cells in keeping with chronic left mastoiditis. Mild paranasal sinus disease. No acute fracture or listhesis of the cervical spine. Degenerative disc and degenerative joint disease resulting in moderate to severe central canal stenosis at C4-5 and multilevel neural foraminal narrowing as described above. These results were called by telephone at the time of interpretation on 01/26/2021 at 12:36 am to provider [facility] , who verbally acknowledged these results. Electronically Signed   By: Fidela Salisbury MD   On: 01/26/2021 00:39   CT CERVICAL SPINE WO CONTRAST  Result Date: 01/26/2021 CLINICAL DATA:  Fall, head injury, neck injury, chronic anticoagulation EXAM: CT HEAD WITHOUT CONTRAST CT CERVICAL SPINE WITHOUT CONTRAST TECHNIQUE: Multidetector CT imaging of the head and cervical spine was performed following the standard protocol without intravenous contrast. Multiplanar CT image reconstructions of the cervical spine were also generated. COMPARISON:  MRI head 09/17/2020, CT neck 04/01/2017 FINDINGS: CT HEAD FINDINGS Brain: There is normal anatomic configuration of the brain. A small right subdural hematoma is seen along the right parietal convexity measuring 8-9 mm in thickness on coronal image # 47 and demonstrating mild mass effect upon the right cerebral hemisphere. There is no associated midline shift. Additionally, there is a small focus of  subarachnoid hemorrhage seen within the sulcus of the hippocampal  gyrus, best noted on coronal image # 43. No acute infarct. Cerebellum unremarkable. Ventricular size is normal. No abnormal intra or extra-axial mass lesion. Previously noted hyperdensity within the pons and inferior left temporal lobe have resolved in the interval since the prior examination in keeping with resolved hemorrhage. Vascular: No asymmetric hyperdense vasculature at the skull base. Skull: The calvarium is intact Sinuses/Orbits: Mild mucosal thickening within the a left maxillary sinus and frontal sinuses with opacification of several left ethmoid air cells. The orbits are unremarkable Other: Small right occipital scalp hematoma with punctate focus of subcutaneous gas in keeping with direct trauma. There is fluid opacification of several inferior left mastoid air cells with coalescence of the air cells in keeping with changes of chronic mastoiditis. Middle ear cavities and right mastoid air cells are clear. CT CERVICAL SPINE FINDINGS Alignment: There is reversal of the normal cervical lordosis from C2-C6 likely degenerative in nature. No listhesis. Skull base and vertebrae: The craniocervical junction is unremarkable. The atlantodental interval is normal. No acute fracture of the cervical spine. Soft tissues and spinal canal: Posterior disc osteophyte complex at C4-5 results in moderate to severe central canal stenosis with an AP diameter of the spinal canal of 6-7 mm and resultant flattening of the thecal sac. Posterior disc osteophyte complex ease at C3-4 and C5-6 results in mild central canal stenosis. No canal hematoma. No prevertebral soft tissue swelling. No paraspinal fluid collections are identified. Disc levels: Review of the sagittal images demonstrates intervertebral disc space narrowing and endplate remodeling throughout the cervical spine, most severe at C4-C6 in keeping with changes of severe degenerative disc disease. Vertebral body height has been preserved. Review of the axial images  demonstrates multilevel uncovertebral and facet arthrosis resulting in multilevel moderate to severe neural foraminal narrowing, most severe on the right at C2-3, C3-4, C4-5, and C5-6 and on the left at C5-6. Upper chest: Biapical scarring and left apical calcified pleural plaques are identified. Other: Thyroid unremarkable IMPRESSION: Acute intracranial hemorrhage with small right parietal subdural hematoma demonstrating mild mass effect upon the right cerebral hemisphere. No midline shift. Additionally, mild subarachnoid hemorrhage is seen involving the medial right temporal lobe. Previously noted foci of hyperattenuation involving the pons and left temporal lobe have resolved in keeping with hemorrhage at that time. Fluid opacification of the inferior left mastoid air cells with coalescence of the air cells in keeping with chronic left mastoiditis. Mild paranasal sinus disease. No acute fracture or listhesis of the cervical spine. Degenerative disc and degenerative joint disease resulting in moderate to severe central canal stenosis at C4-5 and multilevel neural foraminal narrowing as described above. These results were called by telephone at the time of interpretation on 01/26/2021 at 12:36 am to provider [facility] , who verbally acknowledged these results. Electronically Signed   By: Fidela Salisbury MD   On: 01/26/2021 00:39   DG Pelvis Portable  Result Date: 01/25/2021 CLINICAL DATA:  Fall EXAM: PORTABLE PELVIS 1-2 VIEWS COMPARISON:  None. FINDINGS: SI joints are non widened. Pubic symphysis and rami appear intact. No acute displaced fracture or malalignment. IMPRESSION: Negative. Electronically Signed   By: Donavan Foil M.D.   On: 01/25/2021 23:52   DG Chest Port 1 View  Result Date: 01/25/2021 CLINICAL DATA:  Fall EXAM: PORTABLE CHEST 1 VIEW COMPARISON:  09/17/2020 FINDINGS: Hyperinflated lungs. No acute consolidation or effusion. Normal cardiac size. Slight asymmetric irregular opacity in the  right hilus. No  pneumothorax. IMPRESSION: No acute infiltrate or edema. Slight asymmetric irregular opacity in the right hilus, uncertain if this is due to summation artifact and rotation versus hilar abnormality. Correlation with chest CT could be considered. Electronically Signed   By: Donavan Foil M.D.   On: 01/25/2021 23:54   ECHOCARDIOGRAM COMPLETE  Result Date: 01/26/2021    ECHOCARDIOGRAM REPORT   Patient Name:   Ryan Lynn Date of Exam: 01/26/2021 Medical Rec #:  [id number]    Height:       72.0 in Accession #:    [id number]   Weight:       134.7 lb Date of Birth:  03/09/1934     BSA:          1.801 m Patient Age:    87 years     BP:           128/73 mmHg Patient Gender: M            HR:           93 bpm. Exam Location:  Inpatient Procedure: 2D Echo, Color Doppler and Cardiac Doppler Indications:    Premature ventricular contraction  History:        Patient has no prior history of Echocardiogram examinations.                 Arrythmias:Atrial Fibrillation; Signs/Symptoms:Syncope.  Sonographer:    Clayton Lefort RDCS (AE) Referring Phys: [id number] [facility]  Sonographer Comments: Suboptimal parasternal window. IMPRESSIONS  1. Left ventricular ejection fraction, by estimation, is 45%. The left ventricle has mildly decreased function. The left ventricle demonstrates global hypokinesis. Left ventricular diastolic parameters are consistent with Grade I diastolic dysfunction (impaired relaxation).  2. Right ventricular systolic function is normal. The right ventricular size is normal. There is normal pulmonary artery systolic pressure. The estimated right ventricular systolic pressure is 123XX123 mmHg.  3. The mitral valve is normal in structure. No evidence of mitral valve regurgitation. No evidence of mitral stenosis.  4. The aortic valve is tricuspid. Aortic valve regurgitation is not visualized. Mild aortic valve sclerosis is present, with no evidence of aortic valve stenosis.  5. Aortic dilatation noted. There  is mild dilatation of the aortic root, measuring 41 mm.  6. The inferior vena cava is normal in size with greater than 50% respiratory variability, suggesting right atrial pressure of 3 mmHg. FINDINGS  Left Ventricle: Left ventricular ejection fraction, by estimation, is 45%. The left ventricle has mildly decreased function. The left ventricle demonstrates global hypokinesis. The left ventricular internal cavity size was normal in size. There is no left ventricular hypertrophy. Left ventricular diastolic parameters are consistent with Grade I diastolic dysfunction (impaired relaxation). Right Ventricle: The right ventricular size is normal. No increase in right ventricular wall thickness. Right ventricular systolic function is normal. There is normal pulmonary artery systolic pressure. The tricuspid regurgitant velocity is 2.79 m/s, and  with an assumed right atrial pressure of 3 mmHg, the estimated right ventricular systolic pressure is 123XX123 mmHg. Left Atrium: Left atrial size was normal in size. Right Atrium: Right atrial size was normal in size. Pericardium: There is no evidence of pericardial effusion. Mitral Valve: The mitral valve is normal in structure. There is mild calcification of the mitral valve leaflet(s). No evidence of mitral valve regurgitation. No evidence of mitral valve stenosis. Tricuspid Valve: The tricuspid valve is normal in structure. Tricuspid valve regurgitation is trivial. Aortic Valve: The aortic valve is tricuspid. Aortic valve regurgitation is  not visualized. Mild aortic valve sclerosis is present, with no evidence of aortic valve stenosis. Aortic valve mean gradient measures 2.0 mmHg. Aortic valve peak gradient measures 3.2 mmHg. Aortic valve area, by VTI measures 2.60 cm. Pulmonic Valve: The pulmonic valve was normal in structure. Pulmonic valve regurgitation is not visualized. Aorta: Aortic dilatation noted. There is mild dilatation of the aortic root, measuring 41 mm. Venous: The  inferior vena cava is normal in size with greater than 50% respiratory variability, suggesting right atrial pressure of 3 mmHg. IAS/Shunts: No atrial level shunt detected by color flow Doppler.  LEFT VENTRICLE PLAX 2D LVIDd:         4.70 cm LVIDs:         3.80 cm LV PW:         0.90 cm LV IVS:        0.90 cm LVOT diam:     2.00 cm     3D Volume EF: LV SV:         44          3D EF:        53 % LV SV Index:   24          LV EDV:       127 ml LVOT Area:     3.14 cm    LV ESV:       60 ml                            LV SV:        67 ml  LV Volumes (MOD) LV vol d, MOD A2C: 91.9 ml LV vol d, MOD A4C: 97.3 ml LV vol s, MOD A2C: 48.9 ml LV vol s, MOD A4C: 50.2 ml LV SV MOD A2C:     43.0 ml LV SV MOD A4C:     97.3 ml LV SV MOD BP:      43.4 ml RIGHT VENTRICLE             IVC RV Basal diam:  3.30 cm     IVC diam: 1.40 cm RV S prime:     11.10 cm/s TAPSE (M-mode): 3.0 cm LEFT ATRIUM           Index       RIGHT ATRIUM           Index LA diam:      1.30 cm 0.72 cm/m  RA Area:     13.20 cm LA Vol (A2C): 16.7 ml 9.27 ml/m  RA Volume:   29.70 ml  16.49 ml/m LA Vol (A4C): 20.9 ml 11.60 ml/m  AORTIC VALVE AV Area (Vmax):    2.53 cm AV Area (Vmean):   2.36 cm AV Area (VTI):     2.60 cm AV Vmax:           89.20 cm/s AV Vmean:          66.800 cm/s AV VTI:            0.168 m AV Peak Grad:      3.2 mmHg AV Mean Grad:      2.0 mmHg LVOT Vmax:         71.90 cm/s LVOT Vmean:        50.200 cm/s LVOT VTI:          0.139 m LVOT/AV VTI ratio: 0.83  AORTA Ao Root diam: 4.10 cm Ao Asc diam:  3.90 cm TRICUSPID VALVE TR Peak grad:   31.1 mmHg TR Vmax:        279.00 cm/s  SHUNTS Systemic VTI:  0.14 m Systemic Diam: 2.00 cm Loralie Champagne MD Electronically signed by Loralie Champagne MD Signature Date/Time: 01/26/2021/6:32:34 PM    Final    VAS US CAROTID  Result Date: 01/26/2021 Carotid Arterial Duplex Study Indications:       Syncope. Comparison Study:  no prior Performing Technologist: Abram Sander RVS  Examination Guidelines: A complete  evaluation includes B-mode imaging, spectral Doppler, color Doppler, and power Doppler as needed of all accessible portions of each vessel. Bilateral testing is considered an integral part of a complete examination. Limited examinations for reoccurring indications may be performed as noted.  Right Carotid Findings: +----------+--------+--------+--------+------------------+--------+             PSV cm/s EDV cm/s Stenosis Plaque Description Comments  +----------+--------+--------+--------+------------------+--------+  CCA Prox   66       9                 heterogenous                 +----------+--------+--------+--------+------------------+--------+  CCA Distal 75       14                heterogenous                 +----------+--------+--------+--------+------------------+--------+  ICA Prox   51       13       1-39%    heterogenous                 +----------+--------+--------+--------+------------------+--------+  ICA Distal 55       12                                             +----------+--------+--------+--------+------------------+--------+  ECA        75       7                                              +----------+--------+--------+--------+------------------+--------+ +----------+--------+-------+--------+-------------------+             PSV cm/s EDV cms Describe Arm Pressure (mmHG)  +----------+--------+-------+--------+-------------------+  Subclavian 107                                            +----------+--------+-------+--------+-------------------+ +---------+--------+--+--------+-+---------+  Vertebral PSV cm/s 29 EDV cm/s 9 Antegrade  +---------+--------+--+--------+-+---------+  Left Carotid Findings: +----------+--------+--------+--------+------------------+--------+             PSV cm/s EDV cm/s Stenosis Plaque Description Comments  +----------+--------+--------+--------+------------------+--------+  CCA Prox   56       11                heterogenous                  +----------+--------+--------+--------+------------------+--------+  CCA Distal 85       15                heterogenous                 +----------+--------+--------+--------+------------------+--------+  ICA Prox   52       13       1-39%    heterogenous                 +----------+--------+--------+--------+------------------+--------+  ICA Distal 58       13                                             +----------+--------+--------+--------+------------------+--------+  ECA        91       13                                             +----------+--------+--------+--------+------------------+--------+ +----------+--------+--------+--------+-------------------+             PSV cm/s EDV cm/s Describe Arm Pressure (mmHG)  +----------+--------+--------+--------+-------------------+  Subclavian 73                                              +----------+--------+--------+--------+-------------------+ +---------+--------+--------+--------------+  Vertebral PSV cm/s EDV cm/s Not identified  +---------+--------+--------+--------------+   Summary: Right Carotid: Velocities in the right ICA are consistent with a 1-39% stenosis. Left Carotid: Velocities in the left ICA are consistent with a 1-39% stenosis. Vertebrals: Right vertebral artery demonstrates antegrade flow. Left vertebral             artery was not visualized. *See table(s) above for measurements and observations.  Electronically signed by Servando Snare MD on 01/26/2021 at 4:43:41 PM.    Final     Assessment/Plan: 87 yo M s/p syncopal event and fall with small right convexity SDH, stable on repeat scan. - if medically indicated, okay for pharmacologic DVT prophylaxis on 1/29 - Keppra x 7 d - can f/u in neurosurgery clinic in 6 weeks with a f/u head CT without contrast - discussed plan of care with patient's wife at bedside who verbalized understanding and agreement  - Patient appears to have Afib.  given the patient's history of falls, the risks of full  anticoagulation likely outweigh the benefits at this point.  Vallarie Mare 01/27/2021, 5:32 PM

## 2021-01-27 NOTE — TOC CAGE-AID Note (Signed)
Transition of Care [facility]) - CAGE-AID Screening   Patient Details  Name: Ryan Lynn MRN: [id number] Date of Birth: [birth year]  Transition of Care [facility]) CM/SW Contact:    Dia Crawford, RN Phone Number: 01/27/2021, 4:19 PM   Clinical Narrative:  Patient not verbally responsive to questions at this time, wife at bedside states he has been this way today, pt moving all extremities    CAGE-AID Screening: Substance Abuse Screening unable to be completed due to: : Patient unable to participate

## 2021-01-27 NOTE — Progress Notes (Signed)
Inpatient Rehab Admissions Coordinator Note:   Per therapy recommendations, pt was screened for CIR candidacy by Shann Medal, PT, DPT.  At this time we are recommending a CIR consult.  If pt would like to be considered please place and IP rehab MD consult order.  Please contact me with questions.   Shann Medal, PT, DPT [phone] 01/27/21 4:41 PM

## 2021-01-27 NOTE — Progress Notes (Signed)
Occupational Therapy Evaluation Patient Details Name: Ryan Lynn MRN: [id number] DOB: [birth year] Today's Date: 01/27/2021    History of Present Illness 87 yo admitted 1/26 after fall down stairs possibly with syncope. Pt with new Afib with RVR with small right SDH, SAH. PMhx: ICH in 2021   Clinical Impression   PTA pt lives at home with wife is is modified independent with ADL, IADL tasks and mobility with use of a straight cane in the community/outside. Pt requires min A +2 for safety for mobility @ RW level ( Mod A without RW) and mod A with ADL tasks due to deficits listed below. Recommend rehab at CIR to maximize functional level of independence and facilitate safe DC home with supportive wife. VSS during session. Will follow acutely.     Follow Up Recommendations  CIR;Supervision/Assistance - 24 hour    Equipment Recommendations  3 in 1 bedside commode;Other (comment) (RW)    Recommendations for Other Services Rehab consult     Precautions / Restrictions Precautions Precautions: Fall Restrictions Weight Bearing Restrictions: No      Mobility Bed Mobility Overal bed mobility: Needs Assistance Bed Mobility: Supine to Sit     Supine to sit: Min guard;HOB elevated     General bed mobility comments: increased time; leaning posteriorly at times    Transfers Overall transfer level: Needs assistance Equipment used: Rolling walker (2 wheeled) Transfers: Sit to/from [facility] Sit to Stand: +2 safety/equipment;Min assist Stand pivot transfers: Min assist;+2 physical assistance (with RW; mod A without RW)            Balance Overall balance assessment: History of Falls;Needs assistance   Sitting balance-Leahy Scale: Poor   Postural control: Posterior lean;Other (comment) (L bias)   Standing balance-Leahy Scale: Poor Standing balance comment: reliant on external support which is not his baseline                           ADL either  performed or assessed with clinical judgement   ADL Overall ADL's : Needs assistance/impaired     Grooming: Minimal assistance;Sitting   Upper Body Bathing: Minimal assistance;Sitting   Lower Body Bathing: Moderate assistance;Sit to/from stand   Upper Body Dressing : Minimal assistance;Sitting   Lower Body Dressing: Moderate assistance;Sit to/from stand   Toilet Transfer: Minimal assistance;+2 for safety/equipment;RW Toilet Transfer Details (indicate cue type and reason): Requires Mod A at times without RW due to posterior lean and L bias Toileting- Clothing Manipulation and Hygiene: Moderate assistance       Functional mobility during ADLs: Minimal assistance;+2 for safety/equipment;Rolling walker;Cueing for safety;Cueing for sequencing       Vision Baseline Vision/History: Wears glasses Wears Glasses: At all times Additional Comments: Pt with history of glaucoma and reports increased difficulty seeing in his lower field; note overshooting when reaching for objects; will further assess     Perception Perception Comments: decreased awaqreness of spatial relations; will further assess   Praxis Praxis Praxis-Other Comments: appears intact    Pertinent Vitals/Pain Pain Assessment: Faces Faces Pain Scale: No hurt Pain Location: L thumb Pain Descriptors / Indicators: Discomfort Pain Intervention(s): Limited activity within patient's tolerance     Hand Dominance Right   Extremity/Trunk Assessment Upper Extremity Assessment Upper Extremity Assessment: Generalized weakness;LUE deficits/detail (decreased coordination B hands; will further assess) LUE Deficits / Details: LUE deficits from radiation; dropping items at times with L hand   Lower Extremity Assessment Lower Extremity Assessment: Defer to  PT evaluation   Cervical / Trunk Assessment Cervical / Trunk Assessment: Kyphotic;Other exceptions (forward head; upper trunk/L quadrant apparently affected by radiation)    Communication Communication Communication: HOH (hears better in R ear)   Cognition Arousal/Alertness: Awake/alert Behavior During Therapy: WFL for tasks assessed/performed Overall Cognitive Status: Impaired/Different from baseline Area of Impairment: Orientation;Attention;Memory;Safety/judgement;Awareness;Problem solving;Following commands                   Current Attention Level: Sustained;Selective (When distracted, pt unaware of leaning posteriorly) Memory: Decreased short-term memory Following Commands: Follows one step commands with increased time Safety/Judgement: Decreased awareness of safety;Decreased awareness of deficits Awareness: Emergent Problem Solving: Slow processing     General Comments  wife [name]) present during session adn educated on recommendation for post acute rehab    Exercises     Shoulder Instructions      Home Living Family/patient expects to be discharged to:: Private residence Living Arrangements: Spouse/significant other Available Help at Discharge: Family;Available 24 hours/day Type of Home: House Home Access: Stairs to enter [employer] of Steps: 5 (17 on deck) Entrance Stairs-Rails: Right;Left Home Layout: Two level;Multi-level;1/2 bath on main level;Able to live on main level with bedroom/bathroom;Bed/bath upstairs Alternate Level Stairs-Number of Steps: flight   Bathroom Shower/Tub: Occupational psychologist: Standard Bathroom Accessibility: Yes How Accessible: Accessible via walker Home Equipment: Cane - single point          Prior Functioning/Environment Level of Independence: Independent with assistive device(s)        Comments: used cane outdorrs and in community (ises Obtuator due to past mouth surgery/radiation)        OT Problem List: Decreased strength;Decreased activity tolerance;Decreased range of motion;Impaired balance (sitting and/or standing);Impaired vision/perception;Decreased  coordination;Decreased cognition;Decreased safety awareness;Decreased knowledge of use of DME or AE;Impaired UE functional use;Pain      OT Treatment/Interventions: Self-care/ADL training;Therapeutic exercise;Neuromuscular education;DME and/or AE instruction;Therapeutic activities;Cognitive remediation/compensation;Visual/perceptual remediation/compensation;Patient/family education;Balance training    OT Goals(Current goals can be found in the care plan section) Acute Rehab OT Goals Patient Stated Goal: to get better and go home OT Goal Formulation: With patient/family Time For Goal Achievement: 02/10/21 Potential to Achieve Goals: Good  OT Frequency: Min 2X/week   Barriers to D/C:            Co-evaluation PT/OT/SLP Co-Evaluation/Treatment: Yes Reason for Co-Treatment: For patient/therapist safety;To address functional/ADL transfers   OT goals addressed during session: ADL's and self-care      AM-PAC OT "6 Clicks" Daily Activity     Outcome Measure Help from another person eating meals?: A Lot Help from another person taking care of personal grooming?: A Little Help from another person toileting, which includes using toliet, bedpan, or urinal?: A Lot Help from another person bathing (including washing, rinsing, drying)?: A Lot Help from another person to put on and taking off regular upper body clothing?: A Little Help from another person to put on and taking off regular lower body clothing?: A Lot 6 Click Score: 14   End of Session Equipment Utilized During Treatment: Gait belt;Rolling walker Nurse Communication: Mobility status  Activity Tolerance: Patient tolerated treatment well Patient left: in chair;with call bell/phone within reach;with chair alarm set;with family/visitor present  OT Visit Diagnosis: Unsteadiness on feet (R26.81);Other abnormalities of gait and mobility (R26.89);Muscle weakness (generalized) (M62.81);History of falling (Z91.81);Other symptoms and signs  involving cognitive function;Pain;Dizziness and giddiness (R42)                Time: 7673-4193 OT  Time Calculation (min): 42 min Charges:  OT General Charges $OT Visit: 1 Visit  Maurie Boettcher, OT/L   Acute OT Clinical Specialist Acute Rehabilitation Services Pager [phone] Office [phone]   [facility] 01/27/2021, 10:56 AM

## 2021-01-27 NOTE — Progress Notes (Signed)
Initial Nutrition Assessment  DOCUMENTATION CODES:   Underweight  INTERVENTION:    Magic cup TID with meals, each supplement provides 290 kcal and 9 grams of protein  Vital Cuisine Shake TID between meals, each supplement provides 520 kcal and 22 grams of protein  Given ongoing/worsening dysphagia and , patient would benefit from tube feeding to help meet increased nutrition needs. Consider Cortrak placement if within patient's goals of care.   NUTRITION DIAGNOSIS:   Increased nutrient needs related to cancer and cancer related treatments (underweight status) as evidenced by estimated needs.  GOAL:   Patient will meet greater than or equal to 90% of their needs  MONITOR:   PO intake,Supplement acceptance  REASON FOR ASSESSMENT:   Consult Assessment of nutrition requirement/status  ASSESSMENT:   87 yo male admitted S/P fall down stairs due to syncopal event, ICH and R SDH. PMH includes A fib, parotic cancer s/p XRT, GERD, glaucoma, aspiration PNA.   Per H&P, patient is on a modified diet at home due to XRT therapy for parotid cancer; he has recently had worsening swallow function and weight loss.    S/P bedside swallow evaluation with SLP today with pharyngeal obturator. Diet has been advanced to dysphagia 3, nectar thick liquids with known risk of aspiration.   Patient is underweight with BMI=18.3. Suspect he is malnourished, but unable to obtain enough information at this time for identification of malnutrition.   He would benefit from tube feeding at least short term, maybe long term if swallowing issues do not resolve. Not sure if this is within patient's goals of care.   Labs reviewed. Sodium 133, phos 2.4, mag 1.8 CBG: 121-135  Medications reviewed and include cholecalciferol, Keppra, Protonix. IVF: LR at 50 ml/h  No weight history available in chart.  NUTRITION - FOCUSED PHYSICAL EXAM:  unable to complete  Diet Order:   Diet Order            DIET DYS 3  Room service appropriate? Yes with Assist; Fluid consistency: Nectar Thick  Diet effective now                 EDUCATION NEEDS:   Not appropriate for education at this time  Skin:  Skin Assessment: Skin Integrity Issues: Skin Integrity Issues:: Other (Comment) Other: skin tear R arm; incision R side of head  Last BM:  PTA  Height:   Ht Readings from Last 1 Encounters:  01/25/21 6' (1.829 m)    Weight:   Wt Readings from Last 1 Encounters:  01/26/21 61.1 kg    Ideal Body Weight:  80.9 kg  BMI:  Body mass index is 18.27 kg/m.  Estimated Nutritional Needs:   Kcal:  2100-2300  Protein:  100-120 gm  Fluid:  >/= 2 L    Lucas Mallow, RD, LDN, CNSC Please refer to Amion for contact information.

## 2021-01-27 NOTE — Evaluation (Signed)
Clinical/Bedside Swallow Evaluation Patient Details  Name: Ryan Lynn MRN: [id number] Date of Birth: Apr 19, 1934  Today's Date: 01/27/2021 Time: SLP Start Time (ACUTE ONLY): 1012 SLP Stop Time (ACUTE ONLY): 1040 SLP Time Calculation (min) (ACUTE ONLY): 28 min  Past Medical History: History reviewed. No pertinent past medical history. Past Surgical History: History reviewed. No pertinent surgical history. HPI:  Pt is an 87 y.o. 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 He presented to [facility] ED 1/26 after he fell at home down a flight of stairs.  CT head 1/27: Acute intracranial hemorrhage with small right parietal subdural hematoma demonstrating mild mass effect upon the right cerebral hemisphere. CXR 1/26: No acute infiltrate or edema. 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 Pt's wife reported that the had swallow studies in the past and provided a sheet with instructions which he has given. Per the wife, pt has vocal fold atrophy and must use chin tuck, effortful swallow, and drink from a straw. Pill sizes that are larger than the size of an aspirin are crushed at home. Liquids must be thickened to "mild to moderate" thickness and he consumed "soft and moist" foods. Pt's wife stated noodles, potatoes, pasta, etc, but stated that the pt cannot control soup. Pt's wife showed SLP the amount of thickener that she typically uses and it appears that he is on nectar thick liquids.   Assessment / Plan / Recommendation Clinical Impression  Pt was seen for bedside swallow evaluation with his wife present. Pt's wife reported that the pt does have baseline dysphagia and consumes soft solids and nectar thick liquids. Oral mechanism exam was significant for left-sided facial weakness, reduced labial ROM, and reduced lingual strength. Vocal quality was hoarse and possible fibrotic tissue was noted upon palpation of the left  pharyngeal musculature. Pt exhibited symptoms of oropharyngeal dysphagia characterized by anterior spillage of liquids, reduced labial seal and stripping, multiple swallows, and coughing across consistencies. Pt and his wife reported that this is the pt's baseline and that he has "pretty significant" swallowing problems. Per the wife, he typically coughs throughout all meals, but some days are worse than others. Pt and his wife reported that they have been advised of his aspiration risk and that they have agreed to continue his current p.o. diet with known aspiration risk. Pt and his wife both stated that they would like to continue the pt's dysphagia 3 diet with nectar thick liquids at this time. SLP will follow for diet tolerance. SLP Visit Diagnosis: Dysphagia, unspecified (R13.10)    Aspiration Risk  Moderate aspiration risk;Severe aspiration risk;Risk for inadequate nutrition/hydration    Diet Recommendation Dysphagia 3 (Mech soft);Nectar-thick liquid   Liquid Administration via: Cup;Straw Medication Administration: Crushed with puree Supervision: Full supervision/cueing for compensatory strategies;Staff to assist with self feeding Compensations: Slow rate;Small sips/bites;Chin tuck;Effortful swallow Postural Changes: Seated upright at 90 degrees;Remain upright for at least 30 minutes after po intake    Other  Recommendations Oral Care Recommendations: Oral care BID Other Recommendations: Order thickener from pharmacy   Follow up Recommendations None      Frequency and Duration min 2x/week  1 week       Prognosis Prognosis for Safe Diet Advancement: Guarded Barriers to Reach Goals: Severity of deficits;Time post onset      Swallow Study   General Date of Onset: 01/26/21 HPI: Pt is an 87 y.o. 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 He presented [facility] ED 1/26 after he fell at home down a flight of stairs.  CT head  1/27: Acute intracranial hemorrhage with small right parietal subdural hematoma demonstrating mild mass effect upon the right cerebral hemisphere. CXR 1/26: No acute infiltrate or edema. 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 Pt's wife reported that the had swallow studies in the past and provided a sheet with instructions which he has given. Per the wife, pt has vocal fold atrophy and must use chin tuck, effortful swallow, and drink from a straw. Pill sizes that are larger than the size of an aspirin are crushed at home. Liquids must be thickened to "mild to moderate" thickness and he consumed "soft and moist" foods. Pt's wife stated noodles, potatoes, pasta, etc, but stated that the pt cannot control soup. Pt's wife showed SLP the amount of thickener that she typically uses and it appears that he is on nectar thick liquids. Type of Study: Bedside Swallow Evaluation Previous Swallow Assessment: None Diet Prior to this Study: NPO Temperature Spikes Noted: No Respiratory Status: Room air History of Recent Intubation: No Behavior/Cognition: Cooperative;Alert;Pleasant mood Oral Cavity Assessment: Within Functional Limits Oral Care Completed by SLP: No Oral Cavity - Dentition: Adequate natural dentition Vision: Functional for self-feeding Self-Feeding Abilities: Needs assist Patient Positioning: Upright in chair;Postural control adequate for testing Baseline Vocal Quality: Hoarse;Low vocal intensity Volitional Cough: Weak Volitional Swallow: Able to elicit    Oral/Motor/Sensory Function Overall Oral Motor/Sensory Function: Mild impairment (Obterator notd.) Facial ROM: Reduced left;Suspected CN VII (facial) dysfunction Facial Symmetry: Suspected CN VII (facial) dysfunction;Abnormal symmetry left Facial Strength: Reduced left;Suspected CN VII (facial) dysfunction Facial Sensation: Within Functional Limits Lingual ROM: Reduced right;Reduced left;Suspected CN XII  (hypoglossal) dysfunction Lingual Symmetry: Abnormal symmetry left;Suspected CN XII (hypoglossal) dysfunction Lingual Strength: Reduced;Suspected CN XII (hypoglossal) dysfunction Velum: Within Functional Limits   Ice Chips Ice chips: Not tested   Thin Liquid Thin Liquid: Not tested    Nectar Thick Nectar Thick Liquid: Impaired Presentation: Straw;Cup;Self Fed Oral Phase Impairments: Reduced labial seal Oral phase functional implications: Left anterior spillage Pharyngeal Phase Impairments: Cough - Immediate   Honey Thick Honey Thick Liquid: Not tested   Puree     Solid     Solid: Impaired (dysphagia 3) Presentation: Spoon Oral Phase Impairments: Reduced labial seal Pharyngeal Phase Impairments: Cough - Immediate     Dickie Cloe I. Hardin Negus, [place], [place] Office number [phone] Pager [phone]  Horton Marshall 01/27/2021,11:10 AM

## 2021-01-28 DIAGNOSIS — R55 Syncope and collapse: Secondary | ICD-10-CM | POA: Diagnosis not present

## 2021-01-28 LAB — BASIC METABOLIC PANEL
Anion gap: 8 (ref 5–15)
BUN: 23 mg/dL (ref 8–23)
CO2: 27 mmol/L (ref 22–32)
Calcium: 8.8 mg/dL — ABNORMAL LOW (ref 8.9–10.3)
Chloride: 99 mmol/L (ref 98–111)
Creatinine, Ser: 0.79 mg/dL (ref 0.61–1.24)
GFR, Estimated: >60 mL/min (ref >60)
Glucose, Bld: 129 mg/dL — ABNORMAL HIGH (ref 70–99)
Potassium: 4.1 mmol/L (ref 3.5–5.1)
Sodium: 134 mmol/L — ABNORMAL LOW (ref 135–145)

## 2021-01-28 LAB — PHOSPHORUS: Phosphorus: 2.3 mg/dL — ABNORMAL LOW (ref 2.5–4.6)

## 2021-01-28 LAB — MAGNESIUM: Magnesium: 1.9 mg/dL (ref 1.7–2.4)

## 2021-01-28 LAB — GLUCOSE, CAPILLARY: Glucose-Capillary: 211 mg/dL — ABNORMAL HIGH (ref 70–99)

## 2021-01-28 NOTE — Progress Notes (Signed)
Physical Therapy Treatment Patient Details Name: Ryan Lynn MRN: [id number] DOB: Jul 07, 1934 Today's Date: 01/28/2021    History of Present Illness 87 yo admitted 1/26 after fall down stairs possibly with syncope. Pt with new Afib with RVR with small right SDH, SAH. PMhx: ICH in 2021    PT Comments    Pt received in supine, lethargic/drowsy but agreeable to therapy session with encouragement and with fair participation and tolerance for mobility. Pt needed increased assist for bed mobility, transfers and gait tasks (modA for transfers/gait and totalA for bed mobility) this date due to slow processing and lethargy but was more alert once standing. Per RN, pt was more alert earlier in morning. Pt continues to benefit from PT services to progress toward functional mobility goals. Continue to recommend high intensity post-acute rehab.   Follow Up Recommendations  CIR;Supervision/Assistance - 24 hour     Equipment Recommendations  Rolling walker with 5" wheels    Recommendations for Other Services       Precautions / Restrictions Precautions Precautions: Fall Precaution Comments: obturator for eating, HOH speak to right ear Restrictions Weight Bearing Restrictions: No    Mobility  Bed Mobility Overal bed mobility: Needs Assistance Bed Mobility: Supine to Sit     Supine to sit: HOB elevated;Total assist;+2 for safety/equipment     General bed mobility comments: pt given hand over hand assist to reach for bed rail, but unable to grip rail with RUE; pt needed transfer pad assist and totalA to move BLE toward EOB and scoot hips toward EOB, maxA for trunk rise even with HOB fully elevated  Transfers Overall transfer level: Needs assistance Equipment used: Rolling walker (2 wheeled) Transfers: Sit to/from Stand Sit to Stand: +2 safety/equipment;Mod assist         General transfer comment: from EOB to RW with modA, RN present to assist with IV pole but not needing to assist to  lift; pt needs hand over hand assist to reach for armrest when sitting and increased time to process cue to sit.  Ambulation/Gait Ambulation/Gait assistance: Min assist;Mod assist;+2 safety/equipment Gait Distance (Feet): 25 Feet Assistive device: Rolling walker (2 wheeled) Gait Pattern/deviations: Step-through pattern;Decreased stride length;Shuffle;Trunk flexed Gait velocity: grossly <0.3 m/s   General Gait Details: short, slow, steps with flexed trunk and cues for posture and direction. pt with posterior bias with increased assist for balance during turns; minA for forward ambulation and modA for turns/RW mgmt   Stairs             Wheelchair Mobility    Modified Rankin (Stroke Patients Only)       Balance Overall balance assessment: History of Falls;Needs assistance Sitting-balance support: Bilateral upper extremity supported Sitting balance-Leahy Scale: Poor Sitting balance - Comments: EOB without UE support with guarding Postural control: Posterior lean;Other (comment) (L bias) Standing balance support: Bilateral upper extremity supported Standing balance-Leahy Scale: Poor Standing balance comment: reliant on external support and RW which is not his baseline                            Cognition Arousal/Alertness: Lethargic Behavior During Therapy: Flat affect Overall Cognitive Status: Impaired/Different from baseline Area of Impairment: Orientation;Attention;Memory;Safety/judgement;Awareness;Problem solving;Following commands                 Orientation Level:  (UTA, pt minimally verbal and difficult to understand his speech) Current Attention Level: Focused Memory: Decreased short-term memory Following Commands: Follows one step commands  with increased time;Follows one step commands inconsistently Safety/Judgement: Decreased awareness of safety;Decreased awareness of deficits   Problem Solving: Slow processing;Decreased initiation;Difficulty  sequencing;Requires verbal cues;Requires tactile cues General Comments: pt very lethargic in supine, needed totalA for transition to EOB and once EOB slightly more alert but still maintaining eyes closed even after given washcloth to wipe Lynn; pt needing multimodal cues and greatly increased time to initiate/perform all mobility tasks. Per RN, he was much less alert during session than he was earlier in day. Chair alarm on for safety at end fo session.      Exercises      General Comments General comments (skin integrity, edema, etc.): foam dressing on sacral region, RN checked while pt seated EOB and no new redness; pt remained up in chair at end of session with heels floated/legs reclined      Pertinent Vitals/Pain Pain Assessment: No/denies pain Faces Pain Scale: No hurt Pain Intervention(s): Monitored during session;Repositioned    Home Living                      Prior Function            PT Goals (current goals can now be found in the care plan section) Acute Rehab PT Goals Patient Stated Goal: to get better and go home PT Goal Formulation: With patient/family Time For Goal Achievement: 02/10/21 Potential to Achieve Goals: Fair Progress towards PT goals: Progressing toward goals (lethargic during session more alert once standing)    Frequency    Min 3X/week      PT Plan Current plan remains appropriate    Co-evaluation              AM-PAC PT "6 Clicks" Mobility   Outcome Measure  Help needed turning from your back to your side while in a flat bed without using bedrails?: A Lot Help needed moving from lying on your back to sitting on the side of a flat bed without using bedrails?: Total Help needed moving to and from a bed to a chair (including a wheelchair)?: A Lot Help needed standing up from a chair using your arms (e.g., wheelchair or bedside chair)?: A Lot Help needed to walk in hospital room?: A Lot Help needed climbing 3-5 steps with a  railing? : A Lot 6 Click Score: 11    End of Session Equipment Utilized During Treatment: Gait belt Activity Tolerance: Patient limited by lethargy;Patient tolerated treatment well Patient left: in chair;with call bell/phone within reach;with chair alarm set;with nursing/sitter in room Nurse Communication: Mobility status PT Visit Diagnosis: Other abnormalities of gait and mobility (R26.89);Difficulty in walking, not elsewhere classified (R26.2);Unsteadiness on feet (R26.81);History of falling (Z91.81)     Time: 2683-4196 PT Time Calculation (min) (ACUTE ONLY): 25 min  Charges:  $Gait Training: 8-22 mins $Therapeutic Activity: 8-22 mins                     Jedi Catalfamo P., PTA Acute Rehabilitation Services Pager: [phone] Office: [place] 01/28/2021, 12:40 PM

## 2021-01-28 NOTE — Progress Notes (Signed)
I have sent a message to our office's scheduling team requesting a follow-up appointment, and our office will call the patient with this information.   

## 2021-01-28 NOTE — Progress Notes (Signed)
Progress Note  Patient Name: PACEN PARADEE Date of Encounter: 01/28/2021  [facility] Cardiologist: Harrell Gave, new  Subjective   No acute distress, answering in one-word questions.  Is status post ICH and SDH.  Inpatient Medications    Scheduled Meds: . carvedilol  3.125 mg Oral BID WC  . chlorhexidine  15 mL Mouth Rinse BID  . Chlorhexidine Gluconate Cloth  6 each Topical Daily  . cholecalciferol  2,000 Units Oral Daily  . dorzolamide  1 drop Both Eyes TID  . feeding supplement  237 mL Oral BID BM  . latanoprost  1 drop Both Eyes QHS  . levETIRAcetam  500 mg Oral BID  . mouth rinse  15 mL Mouth Rinse q12n4p  . pantoprazole  40 mg Oral Daily   Continuous Infusions: . lactated ringers 50 mL/hr at 01/27/21 1915   PRN Meds: docusate sodium, labetalol, polyethylene glycol, Resource ThickenUp Clear   Vital Signs    Vitals:   01/27/21 1953 01/27/21 2348 01/28/21 0411 01/28/21 0743  BP: (!) 141/83 116/64 (!) 125/58 121/86  Pulse: (!) 102 76 72 75  Resp: 18 18 18 18   Temp: 98.9 F (37.2 C) 97.6 F (36.4 C) 98.1 F (36.7 C) 97.6 F (36.4 C)  TempSrc: Oral Oral Oral Oral  SpO2: 96% 96% 100% 99%  Weight:      Height:        Intake/Output Summary (Last 24 hours) at 01/28/2021 1111 Last data filed at 01/27/2021 1210 Gross per 24 hour  Intake 49.97 ml  Output 200 ml  Net -150.03 ml   Last 3 Weights 01/26/2021 01/25/2021  Weight (lbs) 134 lb 11.2 oz 153 lb  Weight (kg) 61.1 kg 69.4 kg      Telemetry    Sinus rhythm with PVCs- Personally Reviewed  ECG    None new- Personally Reviewed  Physical Exam   GEN: No acute distress.   Neck: No JVD Cardiac: RRR, no murmurs, rubs, or gallops.  Respiratory: Clear to auscultation bilaterally. GI: Soft, nontender, non-distended  MS: No edema; No deformity. Neuro:  Nonfocal  Psych: Normal affect   Labs    High Sensitivity Troponin:   Recent Labs  Lab 01/25/21 2345 01/26/21 0208  TROPONINIHS 6 9       Chemistry Recent Labs  Lab 01/25/21 2345 01/26/21 0001 01/26/21 0500 01/28/21 0138  NA 133* 132* 133* 134*  K 4.1 4.1 4.0 4.1  CL 97* 102 100 99  CO2 22  --  25 27  GLUCOSE 123* 119* 131* 129*  BUN 20 23 17 23   CREATININE 0.83 0.70 0.82 0.79  CALCIUM 9.4  --  9.2 8.8*  PROT 6.9  --   --   --   ALBUMIN 4.1  --   --   --   AST 30  --   --   --   ALT 22  --   --   --   ALKPHOS 91  --   --   --   BILITOT 0.7  --   --   --   GFRNONAA >60  --  >60 >60  ANIONGAP 14  --  8 8     Hematology Recent Labs  Lab 01/25/21 2345 01/26/21 0001 01/26/21 0500  WBC 13.4*  --  14.2*  RBC 4.26  --  4.41  HGB 13.7 15.0 13.8  HCT 40.6 44.0 41.7  MCV 95.3  --  94.6  MCH 32.2  --  31.3  MCHC  33.7  --  33.1  RDW 13.0  --  12.9  PLT 195  --  209    BNP Recent Labs  Lab 01/25/21 2349  BNP 171.8*     DDimer No results for input(s): DDIMER in the last 168 hours.   Radiology    CT HEAD WO CONTRAST  Result Date: 01/26/2021 CLINICAL DATA:  Subdural hematoma follow-up EXAM: CT HEAD WITHOUT CONTRAST TECHNIQUE: Contiguous axial images were obtained from the base of the skull through the vertex without intravenous contrast. COMPARISON:  01/26/2021 at 12:04 a.m. FINDINGS: Brain: Unchanged right posterior subdural hematoma measuring 7 mm in thickness, allowing for redistribution. There is no midline shift or other mass effect. No new site of hemorrhage. Vascular: No abnormal hyperdensity of the major intracranial arteries or dural venous sinuses. No intracranial atherosclerosis. Skull: The visualized skull base, calvarium and extracranial soft tissues are normal. Sinuses/Orbits: No fluid levels or advanced mucosal thickening of the visualized paranasal sinuses. No mastoid or middle ear effusion. The orbits are normal. IMPRESSION: Unchanged size of right posterior subdural hematoma, allowing for redistribution. No mass effect. Electronically Signed   By: Ulyses Jarred M.D.   On: 01/26/2021 20:50    ECHOCARDIOGRAM COMPLETE  Result Date: 01/26/2021    ECHOCARDIOGRAM REPORT   Patient Name:   DARRELD DURRELL Date of Exam: 01/26/2021 Medical Rec #:  [id number]    Height:       72.0 in Accession #:    [id number]   Weight:       134.7 lb Date of Birth:  28-Jun-1934     BSA:          1.801 m Patient Age:    87 years     BP:           128/73 mmHg Patient Gender: M            HR:           93 bpm. Exam Location:  Inpatient Procedure: 2D Echo, Color Doppler and Cardiac Doppler Indications:    Premature ventricular contraction  History:        Patient has no prior history of Echocardiogram examinations.                 Arrythmias:Atrial Fibrillation; Signs/Symptoms:Syncope.  Sonographer:    Clayton Lefort RDCS (AE) Referring Phys: [id number] [facility]  Sonographer Comments: Suboptimal parasternal window. IMPRESSIONS  1. Left ventricular ejection fraction, by estimation, is 45%. The left ventricle has mildly decreased function. The left ventricle demonstrates global hypokinesis. Left ventricular diastolic parameters are consistent with Grade I diastolic dysfunction (impaired relaxation).  2. Right ventricular systolic function is normal. The right ventricular size is normal. There is normal pulmonary artery systolic pressure. The estimated right ventricular systolic pressure is 123XX123 mmHg.  3. The mitral valve is normal in structure. No evidence of mitral valve regurgitation. No evidence of mitral stenosis.  4. The aortic valve is tricuspid. Aortic valve regurgitation is not visualized. Mild aortic valve sclerosis is present, with no evidence of aortic valve stenosis.  5. Aortic dilatation noted. There is mild dilatation of the aortic root, measuring 41 mm.  6. The inferior vena cava is normal in size with greater than 50% respiratory variability, suggesting right atrial pressure of 3 mmHg. FINDINGS  Left Ventricle: Left ventricular ejection fraction, by estimation, is 45%. The left ventricle has mildly decreased function. The  left ventricle demonstrates global hypokinesis. The left ventricular internal cavity size was normal in  size. There is no left ventricular hypertrophy. Left ventricular diastolic parameters are consistent with Grade I diastolic dysfunction (impaired relaxation). Right Ventricle: The right ventricular size is normal. No increase in right ventricular wall thickness. Right ventricular systolic function is normal. There is normal pulmonary artery systolic pressure. The tricuspid regurgitant velocity is 2.79 m/s, and  with an assumed right atrial pressure of 3 mmHg, the estimated right ventricular systolic pressure is 80.9 mmHg. Left Atrium: Left atrial size was normal in size. Right Atrium: Right atrial size was normal in size. Pericardium: There is no evidence of pericardial effusion. Mitral Valve: The mitral valve is normal in structure. There is mild calcification of the mitral valve leaflet(s). No evidence of mitral valve regurgitation. No evidence of mitral valve stenosis. Tricuspid Valve: The tricuspid valve is normal in structure. Tricuspid valve regurgitation is trivial. Aortic Valve: The aortic valve is tricuspid. Aortic valve regurgitation is not visualized. Mild aortic valve sclerosis is present, with no evidence of aortic valve stenosis. Aortic valve mean gradient measures 2.0 mmHg. Aortic valve peak gradient measures 3.2 mmHg. Aortic valve area, by VTI measures 2.60 cm. Pulmonic Valve: The pulmonic valve was normal in structure. Pulmonic valve regurgitation is not visualized. Aorta: Aortic dilatation noted. There is mild dilatation of the aortic root, measuring 41 mm. Venous: The inferior vena cava is normal in size with greater than 50% respiratory variability, suggesting right atrial pressure of 3 mmHg. IAS/Shunts: No atrial level shunt detected by color flow Doppler.  LEFT VENTRICLE PLAX 2D LVIDd:         4.70 cm LVIDs:         3.80 cm LV PW:         0.90 cm LV IVS:        0.90 cm LVOT diam:     2.00 cm      3D Volume EF: LV SV:         44          3D EF:        53 % LV SV Index:   24          LV EDV:       127 ml LVOT Area:     3.14 cm    LV ESV:       60 ml                            LV SV:        67 ml  LV Volumes (MOD) LV vol d, MOD A2C: 91.9 ml LV vol d, MOD A4C: 97.3 ml LV vol s, MOD A2C: 48.9 ml LV vol s, MOD A4C: 50.2 ml LV SV MOD A2C:     43.0 ml LV SV MOD A4C:     97.3 ml LV SV MOD BP:      43.4 ml RIGHT VENTRICLE             IVC RV Basal diam:  3.30 cm     IVC diam: 1.40 cm RV S prime:     11.10 cm/s TAPSE (M-mode): 3.0 cm LEFT ATRIUM           Index       RIGHT ATRIUM           Index LA diam:      1.30 cm 0.72 cm/m  RA Area:     13.20 cm LA Vol (A2C): 16.7 ml 9.27 ml/m  RA Volume:   29.70 ml  16.49 ml/m LA Vol (A4C): 20.9 ml 11.60 ml/m  AORTIC VALVE AV Area (Vmax):    2.53 cm AV Area (Vmean):   2.36 cm AV Area (VTI):     2.60 cm AV Vmax:           89.20 cm/s AV Vmean:          66.800 cm/s AV VTI:            0.168 m AV Peak Grad:      3.2 mmHg AV Mean Grad:      2.0 mmHg LVOT Vmax:         71.90 cm/s LVOT Vmean:        50.200 cm/s LVOT VTI:          0.139 m LVOT/AV VTI ratio: 0.83  AORTA Ao Root diam: 4.10 cm Ao Asc diam:  3.90 cm TRICUSPID VALVE TR Peak grad:   31.1 mmHg TR Vmax:        279.00 cm/s  SHUNTS Systemic VTI:  0.14 m Systemic Diam: 2.00 cm Loralie Champagne MD Electronically signed by Loralie Champagne MD Signature Date/Time: 01/26/2021/6:32:34 PM    Final    VAS US CAROTID  Result Date: 01/26/2021 Carotid Arterial Duplex Study Indications:       Syncope. Comparison Study:  no prior Performing Technologist: Abram Sander RVS  Examination Guidelines: A complete evaluation includes B-mode imaging, spectral Doppler, color Doppler, and power Doppler as needed of all accessible portions of each vessel. Bilateral testing is considered an integral part of a complete examination. Limited examinations for reoccurring indications may be performed as noted.  Right Carotid Findings:  +----------+--------+--------+--------+------------------+--------+           PSV cm/sEDV cm/sStenosisPlaque DescriptionComments +----------+--------+--------+--------+------------------+--------+ CCA Prox  66      9               heterogenous               +----------+--------+--------+--------+------------------+--------+ CCA Distal75      14              heterogenous               +----------+--------+--------+--------+------------------+--------+ ICA Prox  51      13      1-39%   heterogenous               +----------+--------+--------+--------+------------------+--------+ ICA Distal55      12                                         +----------+--------+--------+--------+------------------+--------+ ECA       75      7                                          +----------+--------+--------+--------+------------------+--------+ +----------+--------+-------+--------+-------------------+           PSV cm/sEDV cmsDescribeArm Pressure (mmHG) +----------+--------+-------+--------+-------------------+ [id number]                                        +----------+--------+-------+--------+-------------------+ +---------+--------+--+--------+-+---------+ VertebralPSV cm/s29EDV cm/s9Antegrade +---------+--------+--+--------+-+---------+  Left Carotid Findings: +----------+--------+--------+--------+------------------+--------+           PSV  cm/sEDV cm/sStenosisPlaque DescriptionComments +----------+--------+--------+--------+------------------+--------+ CCA Prox  56      11              heterogenous               +----------+--------+--------+--------+------------------+--------+ CCA Distal85      15              heterogenous               +----------+--------+--------+--------+------------------+--------+ ICA Prox  52      13      1-39%   heterogenous               +----------+--------+--------+--------+------------------+--------+  ICA Distal58      13                                         +----------+--------+--------+--------+------------------+--------+ ECA       91      13                                         +----------+--------+--------+--------+------------------+--------+ +----------+--------+--------+--------+-------------------+           PSV cm/sEDV cm/sDescribeArm Pressure (mmHG) +----------+--------+--------+--------+-------------------+ [id number]                                          +----------+--------+--------+--------+-------------------+ +---------+--------+--------+--------------+ VertebralPSV cm/sEDV cm/sNot identified +---------+--------+--------+--------------+   Summary: Right Carotid: Velocities in the right ICA are consistent with a 1-39% stenosis. Left Carotid: Velocities in the left ICA are consistent with a 1-39% stenosis. Vertebrals: Right vertebral artery demonstrates antegrade flow. Left vertebral             artery was not visualized. *See table(s) above for measurements and observations.  Electronically signed by Servando Snare MD on 01/26/2021 at 4:43:41 PM.    Final     Cardiac Studies   TTE 01/26/21 1. Left ventricular ejection fraction, by estimation, is 45%. The left  ventricle has mildly decreased function. The left ventricle demonstrates  global hypokinesis. Left ventricular diastolic parameters are consistent  with Grade I diastolic dysfunction  (impaired relaxation).  2. Right ventricular systolic function is normal. The right ventricular  size is normal. There is normal pulmonary artery systolic pressure. The  estimated right ventricular systolic pressure is 57.0 mmHg.  3. The mitral valve is normal in structure. No evidence of mitral valve  regurgitation. No evidence of mitral stenosis.  4. The aortic valve is tricuspid. Aortic valve regurgitation is not  visualized. Mild aortic valve sclerosis is present, with no evidence of  aortic valve  stenosis.  5. Aortic dilatation noted. There is mild dilatation of the aortic root,  measuring 41 mm.  6. The inferior vena cava is normal in size with greater than 50%  respiratory variability, suggesting right atrial pressure of 3 mmHg.   Patient Profile     87 y.o. male with history of PVCs, CHF  Assessment & Plan    Cardiomyopathy PVCs  Ejection fraction 45%.  Unclear of the cause.  Could be due to potentially frequent PVCs.  Has recently been put on carvedilol.  We Lille Karim continue this for now.  PVC burden can be reassessed as an outpatient.  Carvedilol can be titrated up as blood pressure allows.   [facility] Madisyn Mawhinney sign off.   Medication Recommendations: Carvedilol Other recommendations (labs, testing, etc): None Follow up as an outpatient: To be arranged in cardiology clinic  For questions or updates, please contact [place] Please consult [email or web address] for contact info under        Signed, Brigitte Soderberg Meredith Leeds, MD  01/28/2021, 11:11 AM

## 2021-01-28 NOTE — Progress Notes (Signed)
PROGRESS NOTE    Ryan Lynn  [id number] DOB: 1934-10-10 DOA: 01/25/2021 PCP: Shon Baton, MD   Brief Narrative:  87 yo male fell down stairs at home and brought to ER on 01/25/21.  Found to have [place], Syncope, SDH, A fib, Parotic cancer s/p XRT, GERD, ED, Glaucoma bilateral, Aspiration pneumonia complicated by C diff in 2016   CT head 1/26 > ICH with small right parietal SDH with mild mass effect, no MLS.  Mild SAH of medial right temporal lobe.  Echo 1/27 > EF 45%, grade 1 DD, RVSP 34.1 mmHg, aortic root 41 mm  Carotid duplex 1/27 > Right Carotid: Velocities in the right ICA are consistent with a 1-39% stenosis.  Left Carotid: Velocities in the left ICA are consistent with a 1-39% stenosis.  Vertebrals: Right vertebral artery demonstrates antegrade flow. Left vertebral artery was not visualized.   CT head 1/27 >> no change in Rt posterior SDH   Assessment & Plan:   Principal Problem:   Syncope and collapse Active Problems:   SAH (subarachnoid hemorrhage) (HCC)   SDH (subdural hematoma) (HCC)   Atrial fibrillation with rapid ventricular response (HCC)   Scalp laceration   Fall  Traumatic SDH and SAH secondary to syncopal event(see below). - seen by neurosurgery - imaging studies stable -no further intervention or imaging unless otherwise specified by neurosurgery -7 days of Keppra per neurosurgery recommendation -stop February 02, 2021 - might need outpt follow up with neurology (previously seen by Dr. Antony Contras)  Syncopal event with new onset systolic CHF. PAC, PVCs. Aortic root dilation. Reported hx of PAF. Hypertension. -Unclear etiology, continue to follow clinically  -Continue carvedilol  -Frequent PVCs noted on telemetry -Cardiology following in new reduced EF at 45% -no further recommendations, signed off as of January 28, 2021  Dysphagia in setting of prior hx of parotic cancer s/p XRT. Cachexia. - speech therapy consult to assess swallowing -  nutrition consult to assess caloric needs  - continue IV fluids until able to tolerate diet  Glaucoma. - continue eye drops  Deconditioning, chronic -Continue PT OT  DVT prophylaxis: None in the setting of above, early ambulation, SCDs only Code Status: Full Family Communication: Called wife no answer  Status is: Inpatient  Dispo: The patient is from: Home              Anticipated d/c is to: TBD              Anticipated d/c date is: 24-48h pending above              Patient currently NOT medically stable for discharge  Consultants:   Neurosurgery, PCCM, Cardiology  Procedures:   None  Antimicrobials:  None   Subjective: No acute issues/events overnight  Objective: Vitals:   01/27/21 1952 01/27/21 1953 01/27/21 2348 01/28/21 0411  BP: (!) 141/83 (!) 141/83 116/64 (!) 125/58  Pulse: (!) 104 (!) 102 76 72  Resp:  18 18 18   Temp: 98.9 F (37.2 C) 98.9 F (37.2 C) 97.6 F (36.4 C) 98.1 F (36.7 C)  TempSrc:  Oral Oral Oral  SpO2: 96% 96% 96% 100%  Weight:      Height:        Intake/Output Summary (Last 24 hours) at 01/28/2021 0735 Last data filed at 01/27/2021 1210 Gross per 24 hour  Intake 249.85 ml  Output 335 ml  Net -85.15 ml   Filed Weights   01/25/21 2340 01/26/21 0640  Weight: 69.4 kg 61.1  kg    Examination:  General exam: Appears calm and comfortable  Respiratory system: Clear to auscultation. Respiratory effort normal. Cardiovascular system: S1 & S2 heard, RRR. No JVD, murmurs, rubs, gallops or clicks. No pedal edema. Gastrointestinal system: Abdomen is nondistended, soft and nontender. No organomegaly or masses felt. Normal bowel sounds heard. Central nervous system: Alert and oriented. No focal neurological deficits. Extremities: Symmetric 5 x 5 power. Skin: No rashes, lesions or ulcers Psychiatry: Judgement and insight appear normal. Mood & affect appropriate.     Data Reviewed: I have personally reviewed following labs and imaging  studies  CBC: Recent Labs  Lab 01/25/21 2345 01/26/21 0001 01/26/21 0500  WBC 13.4*  --  14.2*  NEUTROABS 10.6*  --   --   HGB 13.7 15.0 13.8  HCT 40.6 44.0 41.7  MCV 95.3  --  94.6  PLT 195  --  [id number]   Basic Metabolic Panel: Recent Labs  Lab 01/25/21 2345 01/26/21 0001 01/26/21 0500 01/28/21 0138  NA 133* 132* 133* 134*  K 4.1 4.1 4.0 4.1  CL 97* 102 100 99  CO2 22  --  25 27  GLUCOSE 123* 119* 131* 129*  BUN 20 23 17 23   CREATININE 0.83 0.70 0.82 0.79  CALCIUM 9.4  --  9.2 8.8*  MG 2.0  --  1.8 1.9  PHOS  --   --  2.4* 2.3*   GFR: Estimated Creatinine Clearance: 59.4 mL/min (by C-G formula based on SCr of 0.79 mg/dL). Liver Function Tests: Recent Labs  Lab 01/25/21 2345  AST 30  ALT 22  ALKPHOS 91  BILITOT 0.7  PROT 6.9  ALBUMIN 4.1   No results for input(s): LIPASE, AMYLASE in the last 168 hours. No results for input(s): AMMONIA in the last 168 hours. Coagulation Profile: Recent Labs  Lab 01/25/21 2345  INR 1.0   Cardiac Enzymes: No results for input(s): CKTOTAL, CKMB, CKMBINDEX, TROPONINI in the last 168 hours. BNP (last 3 results) No results for input(s): PROBNP in the last 8760 hours. HbA1C: No results for input(s): HGBA1C in the last 72 hours. CBG: Recent Labs  Lab 01/26/21 0653 01/26/21 0735  GLUCAP 121* 135*   Lipid Profile: No results for input(s): CHOL, HDL, LDLCALC, TRIG, CHOLHDL, LDLDIRECT in the last 72 hours. Thyroid Function Tests: Recent Labs    01/26/21 0512  TSH 0.737   Anemia Panel: No results for input(s): VITAMINB12, FOLATE, FERRITIN, TIBC, IRON, RETICCTPCT in the last 72 hours. Sepsis Labs: Recent Labs  Lab 01/25/21 2345  LATICACIDVEN 1.9    Recent Results (from the past 240 hour(s))  SARS Coronavirus 2 by RT PCR (hospital order, performed in [facility] hospital lab) Nasopharyngeal Nasopharyngeal Swab     Status: None   Collection Time: 01/26/21 12:47 AM   Specimen: Nasopharyngeal Swab  Result Value Ref  Range Status   SARS Coronavirus 2 NEGATIVE NEGATIVE Final    Comment: (NOTE) SARS-CoV-2 target nucleic acids are NOT DETECTED.  The SARS-CoV-2 RNA is generally detectable in upper and lower respiratory specimens during the acute phase of infection. The lowest concentration of SARS-CoV-2 viral copies this assay can detect is 250 copies / mL. A negative result does not preclude SARS-CoV-2 infection and should not be used as the sole basis for treatment or other patient management decisions.  A negative result may occur with improper specimen collection / handling, submission of specimen other than nasopharyngeal swab, presence of viral mutation(s) within the areas targeted by this assay, and  inadequate number of viral copies (<250 copies / mL). A negative result must be combined with clinical observations, patient history, and epidemiological information.  Fact Sheet for Patients:   StrictlyIdeas.no  Fact Sheet for Healthcare Providers: BankingDealers.co.za  This test is not yet approved or  cleared by the [place] FDA and has been authorized for detection and/or diagnosis of SARS-CoV-2 by FDA under an Emergency Use Authorization (EUA).  This EUA will remain in effect (meaning this test can be used) for the duration of the COVID-19 declaration under Section 564(b)(1) of the Act, 21 U.S.C. section 360bbb-3(b)(1), unless the authorization is terminated or revoked sooner.  Performed at [facility], [place] [address], [place], [place] [address]   MRSA PCR Screening     Status: None   Collection Time: 01/26/21  6:41 AM   Specimen: Nasopharyngeal  Result Value Ref Range Status   MRSA by PCR NEGATIVE NEGATIVE Final    Comment:        The GeneXpert MRSA Assay (FDA approved for NASAL specimens only), is one component of a comprehensive MRSA colonization surveillance program. It is not intended to diagnose MRSA infection nor to  guide or monitor treatment for MRSA infections. Performed at [facility], [place] [address] [place], [place] [address]          Radiology Studies: CT HEAD WO CONTRAST  Result Date: 01/26/2021 CLINICAL DATA:  Subdural hematoma follow-up EXAM: CT HEAD WITHOUT CONTRAST TECHNIQUE: Contiguous axial images were obtained from the base of the skull through the vertex without intravenous contrast. COMPARISON:  01/26/2021 at 12:04 a.m. FINDINGS: Brain: Unchanged right posterior subdural hematoma measuring 7 mm in thickness, allowing for redistribution. There is no midline shift or other mass effect. No new site of hemorrhage. Vascular: No abnormal hyperdensity of the major intracranial arteries or dural venous sinuses. No intracranial atherosclerosis. Skull: The visualized skull base, calvarium and extracranial soft tissues are normal. Sinuses/Orbits: No fluid levels or advanced mucosal thickening of the visualized paranasal sinuses. No mastoid or middle ear effusion. The orbits are normal. IMPRESSION: Unchanged size of right posterior subdural hematoma, allowing for redistribution. No mass effect. Electronically Signed   By: Ulyses Jarred M.D.   On: 01/26/2021 20:50   ECHOCARDIOGRAM COMPLETE  Result Date: 01/26/2021    ECHOCARDIOGRAM REPORT   Patient Name:   WILIAM DISILVESTRO Date of Exam: 01/26/2021 Medical Rec #:  [id number]    Height:       72.0 in Accession #:    [id number]   Weight:       134.7 lb Date of Birth:  07-08-1934     BSA:          1.801 m Patient Age:    70 years     BP:           128/73 mmHg Patient Gender: M            HR:           93 bpm. Exam Location:  Inpatient Procedure: 2D Echo, Color Doppler and Cardiac Doppler Indications:    Premature ventricular contraction  History:        Patient has no prior history of Echocardiogram examinations.                 Arrythmias:Atrial Fibrillation; Signs/Symptoms:Syncope.  Sonographer:    Clayton Lefort RDCS (AE) Referring Phys: [id number] [facility]   Sonographer Comments: Suboptimal parasternal window. IMPRESSIONS  1. Left ventricular ejection fraction,  by estimation, is 45%. The left ventricle has mildly decreased function. The left ventricle demonstrates global hypokinesis. Left ventricular diastolic parameters are consistent with Grade I diastolic dysfunction (impaired relaxation).  2. Right ventricular systolic function is normal. The right ventricular size is normal. There is normal pulmonary artery systolic pressure. The estimated right ventricular systolic pressure is 123XX123 mmHg.  3. The mitral valve is normal in structure. No evidence of mitral valve regurgitation. No evidence of mitral stenosis.  4. The aortic valve is tricuspid. Aortic valve regurgitation is not visualized. Mild aortic valve sclerosis is present, with no evidence of aortic valve stenosis.  5. Aortic dilatation noted. There is mild dilatation of the aortic root, measuring 41 mm.  6. The inferior vena cava is normal in size with greater than 50% respiratory variability, suggesting right atrial pressure of 3 mmHg. FINDINGS  Left Ventricle: Left ventricular ejection fraction, by estimation, is 45%. The left ventricle has mildly decreased function. The left ventricle demonstrates global hypokinesis. The left ventricular internal cavity size was normal in size. There is no left ventricular hypertrophy. Left ventricular diastolic parameters are consistent with Grade I diastolic dysfunction (impaired relaxation). Right Ventricle: The right ventricular size is normal. No increase in right ventricular wall thickness. Right ventricular systolic function is normal. There is normal pulmonary artery systolic pressure. The tricuspid regurgitant velocity is 2.79 m/s, and  with an assumed right atrial pressure of 3 mmHg, the estimated right ventricular systolic pressure is 123XX123 mmHg. Left Atrium: Left atrial size was normal in size. Right Atrium: Right atrial size was normal in size. Pericardium: There  is no evidence of pericardial effusion. Mitral Valve: The mitral valve is normal in structure. There is mild calcification of the mitral valve leaflet(s). No evidence of mitral valve regurgitation. No evidence of mitral valve stenosis. Tricuspid Valve: The tricuspid valve is normal in structure. Tricuspid valve regurgitation is trivial. Aortic Valve: The aortic valve is tricuspid. Aortic valve regurgitation is not visualized. Mild aortic valve sclerosis is present, with no evidence of aortic valve stenosis. Aortic valve mean gradient measures 2.0 mmHg. Aortic valve peak gradient measures 3.2 mmHg. Aortic valve area, by VTI measures 2.60 cm. Pulmonic Valve: The pulmonic valve was normal in structure. Pulmonic valve regurgitation is not visualized. Aorta: Aortic dilatation noted. There is mild dilatation of the aortic root, measuring 41 mm. Venous: The inferior vena cava is normal in size with greater than 50% respiratory variability, suggesting right atrial pressure of 3 mmHg. IAS/Shunts: No atrial level shunt detected by color flow Doppler.  LEFT VENTRICLE PLAX 2D LVIDd:         4.70 cm LVIDs:         3.80 cm LV PW:         0.90 cm LV IVS:        0.90 cm LVOT diam:     2.00 cm     3D Volume EF: LV SV:         44          3D EF:        53 % LV SV Index:   24          LV EDV:       127 ml LVOT Area:     3.14 cm    LV ESV:       60 ml  LV SV:        67 ml  LV Volumes (MOD) LV vol d, MOD A2C: 91.9 ml LV vol d, MOD A4C: 97.3 ml LV vol s, MOD A2C: 48.9 ml LV vol s, MOD A4C: 50.2 ml LV SV MOD A2C:     43.0 ml LV SV MOD A4C:     97.3 ml LV SV MOD BP:      43.4 ml RIGHT VENTRICLE             IVC RV Basal diam:  3.30 cm     IVC diam: 1.40 cm RV S prime:     11.10 cm/s TAPSE (M-mode): 3.0 cm LEFT ATRIUM           Index       RIGHT ATRIUM           Index LA diam:      1.30 cm 0.72 cm/m  RA Area:     13.20 cm LA Vol (A2C): 16.7 ml 9.27 ml/m  RA Volume:   29.70 ml  16.49 ml/m LA Vol (A4C): 20.9 ml  11.60 ml/m  AORTIC VALVE AV Area (Vmax):    2.53 cm AV Area (Vmean):   2.36 cm AV Area (VTI):     2.60 cm AV Vmax:           89.20 cm/s AV Vmean:          66.800 cm/s AV VTI:            0.168 m AV Peak Grad:      3.2 mmHg AV Mean Grad:      2.0 mmHg LVOT Vmax:         71.90 cm/s LVOT Vmean:        50.200 cm/s LVOT VTI:          0.139 m LVOT/AV VTI ratio: 0.83  AORTA Ao Root diam: 4.10 cm Ao Asc diam:  3.90 cm TRICUSPID VALVE TR Peak grad:   31.1 mmHg TR Vmax:        279.00 cm/s  SHUNTS Systemic VTI:  0.14 m Systemic Diam: 2.00 cm Loralie Champagne MD Electronically signed by Loralie Champagne MD Signature Date/Time: 01/26/2021/6:32:34 PM    Final    VAS US CAROTID  Result Date: 01/26/2021 Carotid Arterial Duplex Study Indications:       Syncope. Comparison Study:  no prior Performing Technologist: Abram Sander RVS  Examination Guidelines: A complete evaluation includes B-mode imaging, spectral Doppler, color Doppler, and power Doppler as needed of all accessible portions of each vessel. Bilateral testing is considered an integral part of a complete examination. Limited examinations for reoccurring indications may be performed as noted.  Right Carotid Findings: +----------+--------+--------+--------+------------------+--------+           PSV cm/sEDV cm/sStenosisPlaque DescriptionComments +----------+--------+--------+--------+------------------+--------+ CCA Prox  66      9               heterogenous               +----------+--------+--------+--------+------------------+--------+ CCA Distal75      14              heterogenous               +----------+--------+--------+--------+------------------+--------+ ICA Prox  51      13      1-39%   heterogenous               +----------+--------+--------+--------+------------------+--------+ ICA Distal55      12                                         +----------+--------+--------+--------+------------------+--------+  ECA       75      7                                           +----------+--------+--------+--------+------------------+--------+ +----------+--------+-------+--------+-------------------+           PSV cm/sEDV cmsDescribeArm Pressure (mmHG) +----------+--------+-------+--------+-------------------+ [id number]                                        +----------+--------+-------+--------+-------------------+ +---------+--------+--+--------+-+---------+ VertebralPSV cm/s29EDV cm/s9Antegrade +---------+--------+--+--------+-+---------+  Left Carotid Findings: +----------+--------+--------+--------+------------------+--------+           PSV cm/sEDV cm/sStenosisPlaque DescriptionComments +----------+--------+--------+--------+------------------+--------+ CCA Prox  56      11              heterogenous               +----------+--------+--------+--------+------------------+--------+ CCA Distal85      15              heterogenous               +----------+--------+--------+--------+------------------+--------+ ICA Prox  52      13      1-39%   heterogenous               +----------+--------+--------+--------+------------------+--------+ ICA Distal58      13                                         +----------+--------+--------+--------+------------------+--------+ ECA       91      13                                         +----------+--------+--------+--------+------------------+--------+ +----------+--------+--------+--------+-------------------+           PSV cm/sEDV cm/sDescribeArm Pressure (mmHG) +----------+--------+--------+--------+-------------------+ [id number]                                          +----------+--------+--------+--------+-------------------+ +---------+--------+--------+--------------+ VertebralPSV cm/sEDV cm/sNot identified +---------+--------+--------+--------------+   Summary: Right Carotid: Velocities in the right ICA are  consistent with a 1-39% stenosis. Left Carotid: Velocities in the left ICA are consistent with a 1-39% stenosis. Vertebrals: Right vertebral artery demonstrates antegrade flow. Left vertebral             artery was not visualized. *See table(s) above for measurements and observations.  Electronically signed by Servando Snare MD on 01/26/2021 at 4:43:41 PM.    Final         Scheduled Meds: . carvedilol  3.125 mg Oral BID WC  . chlorhexidine  15 mL Mouth Rinse BID  . Chlorhexidine Gluconate Cloth  6 each Topical Daily  . cholecalciferol  2,000 Units Oral Daily  . dorzolamide  1 drop Both Eyes TID  . feeding supplement  237 mL Oral BID BM  . latanoprost  1 drop Both Eyes QHS  . levETIRAcetam  500 mg Oral BID  . mouth rinse  15 mL Mouth Rinse q12n4p  . pantoprazole  40 mg Oral Daily   Continuous  Infusions: . lactated ringers 50 mL/hr at 01/27/21 1915     LOS: 2 days    Time spent: 37min    William C Lancaster, DO [facility]  If 7PM-7AM, please contact night-coverage [email or web address]  01/28/2021, 7:35 AM

## 2021-01-29 DIAGNOSIS — R55 Syncope and collapse: Secondary | ICD-10-CM | POA: Diagnosis not present

## 2021-01-29 LAB — BASIC METABOLIC PANEL
Anion gap: 8 (ref 5–15)
BUN: 26 mg/dL — ABNORMAL HIGH (ref 8–23)
CO2: 25 mmol/L (ref 22–32)
Calcium: 8.6 mg/dL — ABNORMAL LOW (ref 8.9–10.3)
Chloride: 100 mmol/L (ref 98–111)
Creatinine, Ser: 0.67 mg/dL (ref 0.61–1.24)
GFR, Estimated: >60 mL/min (ref >60)
Glucose, Bld: 157 mg/dL — ABNORMAL HIGH (ref 70–99)
Potassium: 3.5 mmol/L (ref 3.5–5.1)
Sodium: 133 mmol/L — ABNORMAL LOW (ref 135–145)

## 2021-01-29 LAB — GLUCOSE, CAPILLARY: Glucose-Capillary: 107 mg/dL — ABNORMAL HIGH (ref 70–99)

## 2021-01-29 LAB — CBC
HCT: 35 % — ABNORMAL LOW (ref 39.0–52.0)
Hemoglobin: 11.6 g/dL — ABNORMAL LOW (ref 13.0–17.0)
MCH: 30.9 pg (ref 26.0–34.0)
MCHC: 33.1 g/dL (ref 30.0–36.0)
MCV: 93.3 fL (ref 80.0–100.0)
Platelets: 170 10*3/uL (ref 150–400)
RBC: 3.75 MIL/uL — ABNORMAL LOW (ref 4.22–5.81)
RDW: 13 % (ref 11.5–15.5)
WBC: 8.2 10*3/uL (ref 4.0–10.5)
nRBC: 0 % (ref 0.0–0.2)

## 2021-01-29 MED ORDER — GUAIFENESIN 100 MG/5ML PO SOLN
5.0000 mL | ORAL | Status: DC | PRN
  Administered 2021-01-30 – 2021-02-06 (×8): 100 mg via ORAL
  Filled 2021-01-29 (×9): qty 5

## 2021-01-29 MED ORDER — LEVETIRACETAM 100 MG/ML PO SOLN
500.0000 mg | Freq: Two times a day (BID) | ORAL | Status: AC
  Administered 2021-01-29 – 2021-02-02 (×10): 500 mg via ORAL
  Filled 2021-01-29 (×10): qty 5

## 2021-01-29 NOTE — Progress Notes (Signed)
Patient voided very small amount through the day, patient wanted to void while he stand up. Checked bladder scan at 1900, it showed more than 999. Since he hasn't void, did straight cath and amount was 820 ml. Paging Dr. Avon Gully, but he already left, night nurse made aware this matter and get order from night shift MD for this matter. HS [employer]

## 2021-01-29 NOTE — Progress Notes (Signed)
PROGRESS NOTE    Ryan Lynn  [id number] DOB: 06-Jul-1934 DOA: 01/25/2021 PCP: Shon Baton, MD   Brief Narrative:  87 yo male fell down stairs at home and brought to ER on 01/25/21.  Found to have [place], Syncope, SDH, A fib, Parotic cancer s/p XRT, GERD, ED, Glaucoma bilateral, Aspiration pneumonia complicated by C diff in 2016   CT head 1/26 > ICH with small right parietal SDH with mild mass effect, no MLS.  Mild SAH of medial right temporal lobe.  Echo 1/27 > EF 45%, grade 1 DD, RVSP 34.1 mmHg, aortic root 41 mm  Carotid duplex 1/27 > Right Carotid: Velocities in the right ICA are consistent with a 1-39% stenosis.  Left Carotid: Velocities in the left ICA are consistent with a 1-39% stenosis.  Vertebrals: Right vertebral artery demonstrates antegrade flow. Left vertebral artery was not visualized.   CT head 1/27 >> no change in Rt posterior SDH   Assessment & Plan:   Principal Problem:   Syncope and collapse Active Problems:   SAH (subarachnoid hemorrhage) (HCC)   SDH (subdural hematoma) (HCC)   Atrial fibrillation with rapid ventricular response (HCC)   Scalp laceration   Fall  Traumatic SDH and SAH secondary to syncopal event(see below). - seen by neurosurgery - imaging studies stable -no further intervention or imaging unless otherwise specified by neurosurgery -7 days of Keppra per neurosurgery recommendation -stop February 02, 2021 -patient and wife currently concerned that Keppra is causing patient's diarrhea and currently refusing any further treatment we discussed the risks of stopping Keppra or increased risk for posttraumatic seizure. -Would likely benefit from outpt follow up with neurology (previously seen by Dr. Antony Contras)  Syncopal event with new onset systolic CHF. PAC, PVCs. Aortic root dilation. Reported hx of PAF. Hypertension. -Unclear etiology, continue to follow clinically  -Continue carvedilol  -Frequent PVCs noted on telemetry -Cardiology  following in new reduced EF at 45% -no further recommendations, signed off as of January 28, 2021  Dysphagia in setting of prior hx of parotic cancer s/p XRT. Cachexia. - speech therapy consult to assess swallowing - nutrition consult to assess caloric needs  - continue IV fluids until able to tolerate diet  Glaucoma. - continue eye drops  Deconditioning, chronic -Continue PT OT  DVT prophylaxis: None in the setting of above, early ambulation, SCDs only Code Status: Full Family Communication: Called wife no answer  Status is: Inpatient  Dispo: The patient is from: Home              Anticipated d/c is to: TBD -likely CIR versus SNF given ongoing weakness per PT              Anticipated d/c date is: 24-48h pending above              Patient currently NOT medically stable for discharge  Consultants:   Neurosurgery, PCCM, Cardiology  Procedures:   None  Antimicrobials:  None   Subjective: No acute issues/events overnight -currently refusing Keppra due to questionable side effect of diarrhea.  Otherwise denies headache fever chills nausea vomiting diarrhea constipation.  Objective: Vitals:   01/28/21 1536 01/28/21 2036 01/28/21 2339 01/29/21 0356  BP: (!) 154/82 119/61 (!) 143/75 (!) 146/74  Pulse: 83 83 85 81  Resp: 20 18 17 18   Temp: 98.2 F (36.8 C) (!) 97.5 F (36.4 C) (!) 97.5 F (36.4 C) 97.7 F (36.5 C)  TempSrc: Oral   Oral  SpO2: 95% 100% 97% 97%  Weight:      Height:        Intake/Output Summary (Last 24 hours) at 01/29/2021 0738 Last data filed at 01/28/2021 1500 Gross per 24 hour  Intake --  Output 1 ml  Net -1 ml   Filed Weights   01/25/21 2340 01/26/21 0640  Weight: 69.4 kg 61.1 kg    Examination:  General exam: Appears calm and comfortable  Respiratory system: Clear to auscultation. Respiratory effort normal. Cardiovascular system: S1 & S2 heard, RRR. No JVD, murmurs, rubs, gallops or clicks. No pedal edema. Gastrointestinal system:  Abdomen is nondistended, soft and nontender. No organomegaly or masses felt. Normal bowel sounds heard. Central nervous system: Alert and oriented. No focal neurological deficits. Extremities: Symmetric 5 x 5 power. Skin: No rashes, lesions or ulcers Psychiatry: Judgement and insight appear normal. Mood & affect appropriate.     Data Reviewed: I have personally reviewed following labs and imaging studies  CBC: Recent Labs  Lab 01/25/21 2345 01/26/21 0001 01/26/21 0500 01/29/21 0100  WBC 13.4*  --  14.2* 8.2  NEUTROABS 10.6*  --   --   --   HGB 13.7 15.0 13.8 11.6*  HCT 40.6 44.0 41.7 35.0*  MCV 95.3  --  94.6 93.3  PLT 195  --  209 850   Basic Metabolic Panel: Recent Labs  Lab 01/25/21 2345 01/26/21 0001 01/26/21 0500 01/28/21 0138 01/29/21 0100  NA 133* 132* 133* 134* 133*  K 4.1 4.1 4.0 4.1 3.5  CL 97* 102 100 99 100  CO2 22  --  25 27 25   GLUCOSE 123* 119* 131* 129* 157*  BUN 20 23 17 23  26*  CREATININE 0.83 0.70 0.82 0.79 0.67  CALCIUM 9.4  --  9.2 8.8* 8.6*  MG 2.0  --  1.8 1.9  --   PHOS  --   --  2.4* 2.3*  --    GFR: Estimated Creatinine Clearance: 59.4 mL/min (by C-G formula based on SCr of 0.67 mg/dL). Liver Function Tests: Recent Labs  Lab 01/25/21 2345  AST 30  ALT 22  ALKPHOS 91  BILITOT 0.7  PROT 6.9  ALBUMIN 4.1   No results for input(s): LIPASE, AMYLASE in the last 168 hours. No results for input(s): AMMONIA in the last 168 hours. Coagulation Profile: Recent Labs  Lab 01/25/21 2345  INR 1.0   Cardiac Enzymes: No results for input(s): CKTOTAL, CKMB, CKMBINDEX, TROPONINI in the last 168 hours. BNP (last 3 results) No results for input(s): PROBNP in the last 8760 hours. HbA1C: No results for input(s): HGBA1C in the last 72 hours. CBG: Recent Labs  Lab 01/26/21 0653 01/26/21 0735 01/28/21 2124 01/29/21 0638  GLUCAP 121* 135* 211* 107*   Lipid Profile: No results for input(s): CHOL, HDL, LDLCALC, TRIG, CHOLHDL, LDLDIRECT in  the last 72 hours. Thyroid Function Tests: No results for input(s): TSH, T4TOTAL, FREET4, T3FREE, THYROIDAB in the last 72 hours. Anemia Panel: No results for input(s): VITAMINB12, FOLATE, FERRITIN, TIBC, IRON, RETICCTPCT in the last 72 hours. Sepsis Labs: Recent Labs  Lab 01/25/21 2345  LATICACIDVEN 1.9    Recent Results (from the past 240 hour(s))  SARS Coronavirus 2 by RT PCR (hospital order, performed in [facility] hospital lab) Nasopharyngeal Nasopharyngeal Swab     Status: None   Collection Time: 01/26/21 12:47 AM   Specimen: Nasopharyngeal Swab  Result Value Ref Range Status   SARS Coronavirus 2 NEGATIVE NEGATIVE Final    Comment: (NOTE) SARS-CoV-2 target nucleic acids are  NOT DETECTED.  The SARS-CoV-2 RNA is generally detectable in upper and lower respiratory specimens during the acute phase of infection. The lowest concentration of SARS-CoV-2 viral copies this assay can detect is 250 copies / mL. A negative result does not preclude SARS-CoV-2 infection and should not be used as the sole basis for treatment or other patient management decisions.  A negative result may occur with improper specimen collection / handling, submission of specimen other than nasopharyngeal swab, presence of viral mutation(s) within the areas targeted by this assay, and inadequate number of viral copies (<250 copies / mL). A negative result must be combined with clinical observations, patient history, and epidemiological information.  Fact Sheet for Patients:   StrictlyIdeas.no  Fact Sheet for Healthcare Providers: BankingDealers.co.za  This test is not yet approved or  cleared by the [place] FDA and has been authorized for detection and/or diagnosis of SARS-CoV-2 by FDA under an Emergency Use Authorization (EUA).  This EUA will remain in effect (meaning this test can be used) for the duration of the COVID-19 declaration under Section  564(b)(1) of the Act, 21 U.S.C. section 360bbb-3(b)(1), unless the authorization is terminated or revoked sooner.  Performed at [facility], [place] [address] [place], [place] [address]   MRSA PCR Screening     Status: None   Collection Time: 01/26/21  6:41 AM   Specimen: Nasopharyngeal  Result Value Ref Range Status   MRSA by PCR NEGATIVE NEGATIVE Final    Comment:        The GeneXpert MRSA Assay (FDA approved for NASAL specimens only), is one component of a comprehensive MRSA colonization surveillance program. It is not intended to diagnose MRSA infection nor to guide or monitor treatment for MRSA infections. Performed at [facility], [place] [address], [place], [place] [address]          Radiology Studies: No results found.      Scheduled Meds:  carvedilol  3.125 mg Oral BID WC   chlorhexidine  15 mL Mouth Rinse BID   Chlorhexidine Gluconate Cloth  6 each Topical Daily   cholecalciferol  2,000 Units Oral Daily   dorzolamide  1 drop Both Eyes TID   feeding supplement  237 mL Oral BID BM   latanoprost  1 drop Both Eyes QHS   levETIRAcetam  500 mg Oral BID   mouth rinse  15 mL Mouth Rinse q12n4p   pantoprazole  40 mg Oral Daily   Continuous Infusions:  lactated ringers 50 mL/hr at 01/27/21 1915     LOS: 3 days    Time spent: 29min    Decklyn Hyder C Jaisha Villacres, DO [facility]  If 7PM-7AM, please contact night-coverage [email or web address]  01/29/2021, 7:38 AM

## 2021-01-30 DIAGNOSIS — R55 Syncope and collapse: Secondary | ICD-10-CM | POA: Diagnosis not present

## 2021-01-30 LAB — CBC
HCT: 36 % — ABNORMAL LOW (ref 39.0–52.0)
Hemoglobin: 12.5 g/dL — ABNORMAL LOW (ref 13.0–17.0)
MCH: 32.1 pg (ref 26.0–34.0)
MCHC: 34.7 g/dL (ref 30.0–36.0)
MCV: 92.3 fL (ref 80.0–100.0)
Platelets: 207 10*3/uL (ref 150–400)
RBC: 3.9 MIL/uL — ABNORMAL LOW (ref 4.22–5.81)
RDW: 12.9 % (ref 11.5–15.5)
WBC: 7.7 10*3/uL (ref 4.0–10.5)
nRBC: 0 % (ref 0.0–0.2)

## 2021-01-30 LAB — BASIC METABOLIC PANEL
Anion gap: 10 (ref 5–15)
BUN: 15 mg/dL (ref 8–23)
CO2: 24 mmol/L (ref 22–32)
Calcium: 8.6 mg/dL — ABNORMAL LOW (ref 8.9–10.3)
Chloride: 99 mmol/L (ref 98–111)
Creatinine, Ser: 0.7 mg/dL (ref 0.61–1.24)
GFR, Estimated: >60 mL/min (ref >60)
Glucose, Bld: 119 mg/dL — ABNORMAL HIGH (ref 70–99)
Potassium: 3.5 mmol/L (ref 3.5–5.1)
Sodium: 133 mmol/L — ABNORMAL LOW (ref 135–145)

## 2021-01-30 MED ORDER — TAMSULOSIN HCL 0.4 MG PO CAPS
0.4000 mg | ORAL_CAPSULE | Freq: Every day | ORAL | Status: DC
  Administered 2021-01-30 – 2021-02-06 (×8): 0.4 mg via ORAL
  Filled 2021-01-30 (×9): qty 1

## 2021-01-30 NOTE — Plan of Care (Signed)

## 2021-01-30 NOTE — Progress Notes (Signed)
Physical Therapy Treatment Patient Details Name: Ryan Lynn MRN: [id number] DOB: [birth year] Today's Date: 01/30/2021    History of Present Illness 87 yo admitted 1/26 after fall down stairs possibly with syncope. Pt with new Afib with RVR with small right SDH, SAH. PMhx: ICH in 2021    PT Comments    Patient progressing this session able to perform bed mobility with less assist and ambulated further into the hallway.  Still fatigues and still with postural abnormality and balance deficits which will require intensive skilled therapies prior to d/c home.  Hopeful for CIR level rehab.  Will continue skilled PT in the acute setting.    Follow Up Recommendations  CIR;Supervision/Assistance - 24 hour     Equipment Recommendations  Rolling walker with 5" wheels    Recommendations for Other Services       Precautions / Restrictions Precautions Precautions: Fall Precaution Comments: obturator for eating, HOH speak to right ear    Mobility  Bed Mobility Overal bed mobility: Needs Assistance Bed Mobility: Rolling;Sidelying to Sit Rolling: Min assist Sidelying to sit: Min assist       General bed mobility comments: increased time, but used PT hand, then reached for rail to assist to come up, min A for balance once up and pt able to bring legs off the bed  Transfers Overall transfer level: Needs assistance Equipment used: Rolling walker (2 wheeled) Transfers: Sit to/from Stand Sit to Stand: Mod assist;+2 safety/equipment         General transfer comment: assist for balance, lifting from EOB to RW and pt insistent to have pants on so applied at EOB and assisted to don when standing, but pt initially incontinent of urine and assisted with urinal.  Stood for awhile, then able to ambulate as below  Ambulation/Gait Ambulation/Gait assistance: Min assist;Mod assist;+2 safety/equipment Gait Distance (Feet): 40 Feet (x 2) Assistive device: Rolling walker (2 wheeled) Gait  Pattern/deviations: Step-to pattern;Decreased stride length;Step-through pattern;Trunk flexed;Shuffle     General Gait Details: flexed posture, with cues throughout for forward gaze, some lateral LOB with A with RW to steady, then needing seated rest in the hallway in recliner, then returned to room with wife pushing chair behind.   Stairs             Wheelchair Mobility    Modified Rankin (Stroke Patients Only)       Balance Overall balance assessment: Needs assistance Sitting-balance support: Feet supported;Bilateral upper extremity supported Sitting balance-Leahy Scale: Poor Sitting balance - Comments: when donning pants lifting one leg pt with posterior LOB min A to recover   Standing balance support: Bilateral upper extremity supported Standing balance-Leahy Scale: Poor Standing balance comment: RW for support with flexed posture                            Cognition Arousal/Alertness: Awake/alert Behavior During Therapy: Flat affect Overall Cognitive Status: Impaired/Different from baseline Area of Impairment: Attention;Following commands;Safety/judgement;Problem solving                   Current Attention Level: Sustained   Following Commands: Follows one step commands consistently;Follows one step commands with increased time;Follows multi-step commands inconsistently Safety/Judgement: Decreased awareness of safety;Decreased awareness of deficits   Problem Solving: Slow processing;Requires verbal cues;Requires tactile cues        Exercises      General Comments General comments (skin integrity, edema, etc.): RN in the room end of session  to give cough medicine, increased time with nectar liquids to take sips after walking      Pertinent Vitals/Pain Pain Assessment: No/denies pain    Home Living                      Prior Function            PT Goals (current goals can now be found in the care plan section) Progress  towards PT goals: Progressing toward goals    Frequency    Min 3X/week      PT Plan Current plan remains appropriate    Co-evaluation              AM-PAC PT "6 Clicks" Mobility   Outcome Measure  Help needed turning from your back to your side while in a flat bed without using bedrails?: A Little Help needed moving from lying on your back to sitting on the side of a flat bed without using bedrails?: A Little Help needed moving to and from a bed to a chair (including a wheelchair)?: A Lot Help needed standing up from a chair using your arms (e.g., wheelchair or bedside chair)?: A Lot Help needed to walk in hospital room?: A Lot Help needed climbing 3-5 steps with a railing? : A Lot 6 Click Score: 14    End of Session Equipment Utilized During Treatment: Gait belt Activity Tolerance: Patient tolerated treatment well Patient left: in chair;with call bell/phone within reach;with family/visitor present;with nursing/sitter in room   PT Visit Diagnosis: Other abnormalities of gait and mobility (R26.89);Difficulty in walking, not elsewhere classified (R26.2);Unsteadiness on feet (R26.81);History of falling (Z91.81)     Time: 3976-7341 PT Time Calculation (min) (ACUTE ONLY): 37 min  Charges:  $Gait Training: 8-22 mins $Therapeutic Activity: 8-22 mins                     Magda Kiel, PT Acute Rehabilitation Services Pager:[phone] [phone] 01/30/2021    [name] 01/30/2021, 4:06 PM

## 2021-01-30 NOTE — Care Management Important Message (Signed)
Important Message  Patient Details  Name: Ryan Lynn MRN: [id number] Date of Birth: February 12, 1934   Medicare Important Message Given:  Yes     Chesley Valls 01/30/2021, 4:01 PM

## 2021-01-30 NOTE — Consult Note (Signed)
Physical Medicine and Rehabilitation Consult Reason for Consult: Decreased functional ability with syncope Referring Physician: [facility]   HPI: Ryan Lynn is a 87 y.o. right-handed male with history of glaucoma, prostate cancer with radiation therapy, as well as small [place] 2021 after syncopal episode.  Per chart review patient lives with spouse.  Independent with assistive device.  Two-level home half bath on main level as well as bedroom.  Presented 01/25/2021 after a fall down a flight of stairs.  Transient loss of consciousness.  Cranial CT scan showed acute intracranial hemorrhage with small right parietal subdural hematoma demonstrating mild mass-effect upon the right cerebral hemisphere.  No midline shift.  Additionally, mild subarachnoid hemorrhage seen involving the medial right temporal lobe.  CT cervical spine negative.  Admission chemistries unremarkable except sodium 133, glucose 123, troponin negative, BNP 171.  Neurosurgery followed Dr. Duffy Rhody conservative care with follow-up CT unchanged size of right posterior subdural hematoma no mass-effect.  Keppra added for seizure prophylaxis x7 days.  Hospital course follow-up cardiology services questionable atrial fibrillation.  Carotid Dopplers with no ICA stenosis.  Echocardiogram with ejection fraction of 26% grade 1 diastolic dysfunction currently with plan of conservative care follow-up outpatient..  Currently maintained on a dysphagia #3 nectar thick liquid diet.  Physical Medicine & Rehabilitation was consulted to assess candidacy for CIR given altered mental status and decreased functional ability.    Review of Systems  Constitutional: Negative for chills and fever.  HENT: Negative for hearing loss.   Eyes: Negative for blurred vision and double vision.  Respiratory: Negative for cough and shortness of breath.   Cardiovascular: Positive for leg swelling. Negative for chest pain and palpitations.  Gastrointestinal:  Positive for constipation. Negative for heartburn, nausea and vomiting.  Genitourinary: Positive for urgency. Negative for dysuria, flank pain and hematuria.  Musculoskeletal: Positive for falls, joint pain and myalgias.  Skin: Negative for rash.  Neurological: Positive for dizziness and weakness.       Dysphagia.  All other systems reviewed and are negative.  History reviewed. No pertinent past medical history. History reviewed. No pertinent surgical history. History reviewed. No pertinent family history. Social History:  has no history on file for tobacco use, alcohol use, and drug use. Allergies: Not on File Medications Prior to Admission  Medication Sig Dispense Refill  . cholecalciferol (VITAMIN D3) 25 MCG (1000 UNIT) tablet Take 2,000 Units by mouth daily.    . dorzolamide (TRUSOPT) 2 % ophthalmic solution 1 drop 3 (three) times daily.    Marland Kitchen latanoprost (XALATAN) 0.005 % ophthalmic solution 1 drop at bedtime.    Marland Kitchen omeprazole (PRILOSEC) 20 MG capsule Take 20 mg by mouth daily.      Home: Home Living Family/patient expects to be discharged to:: Private residence Living Arrangements: Spouse/significant other Available Help at Discharge: Family,Available 24 hours/day Type of Home: House Home Access: Stairs to enter [employer] of Steps: 5 (17 on deck) Entrance Stairs-Rails: Shepherd: Two level,Multi-level,1/2 bath on main level,Able to live on main level with bedroom/bathroom,Bed/bath upstairs Alternate Level Stairs-Number of Steps: flight Bathroom Shower/Tub: Multimedia programmer: Standard Bathroom Accessibility: Yes Home Equipment: Cane - single point  Functional History: Prior Function Level of Independence: Independent with assistive device(s) Comments: used cane outdoors and in community (ises Obtuator due to past mouth surgery/radiation) Functional Status:  Mobility: Bed Mobility Overal bed mobility: Needs Assistance Bed Mobility:  Supine to Sit Supine to sit: HOB elevated,Total assist,+2 for safety/equipment General bed  mobility comments: pt given hand over hand assist to reach for bed rail, but unable to grip rail with RUE; pt needed transfer pad assist and totalA to move BLE toward EOB and scoot hips toward EOB, maxA for trunk rise even with HOB fully elevated Transfers Overall transfer level: Needs assistance Equipment used: Rolling walker (2 wheeled) Transfers: Sit to/from Stand Sit to Stand: +2 safety/equipment,Mod assist Stand pivot transfers: Min assist,+2 physical assistance General transfer comment: from EOB to RW with modA, RN present to assist with IV pole but not needing to assist to lift; pt needs hand over hand assist to reach for armrest when sitting and increased time to process cue to sit. Ambulation/Gait Ambulation/Gait assistance: Min assist,Mod assist,+2 safety/equipment Gait Distance (Feet): 25 Feet Assistive device: Rolling walker (2 wheeled) Gait Pattern/deviations: Step-through pattern,Decreased stride length,Shuffle,Trunk flexed General Gait Details: short, slow, steps with flexed trunk and cues for posture and direction. pt with posterior bias with increased assist for balance during turns; minA for forward ambulation and modA for turns/RW mgmt Gait velocity: grossly <0.3 m/s Gait velocity interpretation: <1.8 ft/sec, indicate of risk for recurrent falls    ADL: ADL Overall ADL's : Needs assistance/impaired Grooming: Minimal assistance,Sitting Upper Body Bathing: Minimal assistance,Sitting Lower Body Bathing: Moderate assistance,Sit to/from stand Upper Body Dressing : Minimal assistance,Sitting Lower Body Dressing: Moderate assistance,Sit to/from stand Toilet Transfer: Minimal assistance,+2 for safety/equipment,RW Toilet Transfer Details (indicate cue type and reason): Requires Mod A at times wtihout RW due to posterior lean and L bias Toileting- Clothing Manipulation and Hygiene:  Moderate assistance Functional mobility during ADLs: Minimal assistance,+2 for safety/equipment,Rolling walker,Cueing for safety,Cueing for sequencing  Cognition: Cognition Overall Cognitive Status: Impaired/Different from baseline Orientation Level: Oriented to person Cognition Arousal/Alertness: Lethargic Behavior During Therapy: Flat affect Overall Cognitive Status: Impaired/Different from baseline Area of Impairment: Orientation,Attention,Memory,Safety/judgement,Awareness,Problem solving,Following commands Orientation Level:  (UTA, pt minimally verbal and difficult to understand his speech) Current Attention Level: Focused Memory: Decreased short-term memory Following Commands: Follows one step commands with increased time,Follows one step commands inconsistently Safety/Judgement: Decreased awareness of safety,Decreased awareness of deficits Awareness: Emergent Problem Solving: Slow processing,Decreased initiation,Difficulty sequencing,Requires verbal cues,Requires tactile cues General Comments: pt very lethargic in supine, needed totalA for transition to EOB and once EOB slightly more alert but still maintaining eyes closed even after given washcloth to wipe face; pt needing multimodal cues and greatly increased time to initiate/perform all mobility tasks. Per RN, he was much less alert during session than he was earlier in day. Chair alarm on for safety at end fo session.  Blood pressure 132/71, pulse 93, temperature 99.5 F (37.5 C), temperature source Oral, resp. rate 18, height 6' (1.829 m), weight 61.1 kg, SpO2 92 %. Physical Exam   General: Alert, No apparent distress, cachectic HEENT: Head is normocephalic, atraumatic, PERRLA, EOMI, sclera anicteric, oral mucosa pink and moist, dentition intact, ext ear canals clear,  Neck: Supple without JVD or lymphadenopathy Heart: Reg rate and rhythm. No murmurs rubs or gallops Chest: CTA bilaterally without wheezes, rales, or rhonchi; no  distress Abdomen: Soft, non-tender, non-distended, bowel sounds positive. Extremities: No clubbing, cyanosis, or edema. Pulses are 2+ Psych: Pt's affect is appropriate. Pt is cooperative Skin: Clean and intact without signs of breakdown Neuro: Patient sitting up in chair with family at bedside.  He is a bit lethargic but arousable although he would not open his eyes he was able to provide his name and age.  He cannot recall full events of the fall.  Follows simple  commands, but unable to tolerate MMT due to fatigue (had just ambulated in hallway with PT).   Results for orders placed or performed during the hospital encounter of 01/25/21 (from the past 24 hour(s))  CBC     Status: Abnormal   Collection Time: 01/30/21  1:46 AM  Result Value Ref Range   WBC 7.7 4.0 - 10.5 K/uL   RBC 3.90 (L) 4.22 - 5.81 MIL/uL   Hemoglobin 12.5 (L) 13.0 - 17.0 g/dL   HCT 36.0 (L) 39.0 - 52.0 %   MCV 92.3 80.0 - 100.0 fL   MCH 32.1 26.0 - 34.0 pg   MCHC 34.7 30.0 - 36.0 g/dL   RDW 12.9 11.5 - 15.5 %   Platelets 207 150 - 400 K/uL   nRBC 0.0 0.0 - 0.2 %  Basic metabolic panel     Status: Abnormal   Collection Time: 01/30/21  1:46 AM  Result Value Ref Range   Sodium 133 (L) 135 - 145 mmol/L   Potassium 3.5 3.5 - 5.1 mmol/L   Chloride 99 98 - 111 mmol/L   CO2 24 22 - 32 mmol/L   Glucose, Bld 119 (H) 70 - 99 mg/dL   BUN 15 8 - 23 mg/dL   Creatinine, Ser 0.70 0.61 - 1.24 mg/dL   Calcium 8.6 (L) 8.9 - 10.3 mg/dL   GFR, Estimated >60 >60 mL/min   Anion gap 10 5 - 15   No results found.   Assessment/Plan: Diagnosis: ICH with small right parietal SDH 1. Does the need for close, 24 hr/day medical supervision in concert with the patient's rehab needs make it unreasonable for this patient to be served in a less intensive setting? Yes 2. Co-Morbidities requiring supervision/potential complications:  1. Syncope: fall precautions 2. Afib 3. Parotic cancer s/p XRT 4. GERD 5. Bilateral glaucoma 6. Impaired  mobility and ADLs- will require intensive PT/OT 7. Dysphagia: will require intensive SLP 3. Due to bladder management, bowel management, safety, skin/wound care, disease management, medication administration, pain management and patient education, does the patient require 24 hr/day rehab nursing? Yes 4. Does the patient require coordinated care of a physician, rehab nurse, therapy disciplines of PT, OT, SLP to address physical and functional deficits in the context of the above medical diagnosis(es)? Yes Addressing deficits in the following areas: balance, endurance, locomotion, strength, transferring, bowel/bladder control, bathing, dressing, feeding, grooming, toileting, cognition, swallowing and psychosocial support 5. Can the patient actively participate in an intensive therapy program of at least 3 hrs of therapy per day at least 5 days per week? Yes 6. The potential for patient to make measurable gains while on inpatient rehab is good 7. Anticipated functional outcomes upon discharge from inpatient rehab are modified independent  with PT, modified independent with OT, supervision with SLP. 8. Estimated rehab length of stay to reach the above functional goals is: 10-14 days 9. Anticipated discharge destination: Home 10. Overall Rehab/Functional Prognosis: good  RECOMMENDATIONS: This patient's condition is appropriate for continued rehabilitative care in the following setting: CIR Patient has agreed to participate in recommended program. Yes Note that insurance prior authorization may be required for reimbursement for recommended care.  Comment: Thank you for this consult. Admission coordinator to follow.   I have personally performed a face to face diagnostic evaluation, including, but not limited to relevant history and physical exam findings, of this patient and developed relevant assessment and plan.  Additionally, I have reviewed and concur with the physician assistant's documentation  above.  Leeroy Cha, MD  [facility], PA-C 01/30/2021

## 2021-01-30 NOTE — Progress Notes (Signed)
OT Cancellation Note  Patient Details Name: Ryan Lynn MRN: [id number] DOB: 01/04/1934   Cancelled Treatment:    Reason Eval/Treat Not Completed: Fatigue/lethargy limiting ability to participate;Other (comment) Pt asleep in recliner upon arrival with pts family member stating he is very tired from walking with PT. Will check back as time allows for OT session.  Ryan Ports K., COTA/L Acute Rehabilitation Services [phone]   Ryan Lynn 01/30/2021, 12:15 PM

## 2021-01-30 NOTE — Progress Notes (Signed)
Inpatient Rehab Admissions Coordinator Note:   Per therapy recommendations, pt was screened for CIR candidacy by Calista Crain, MS CCC-SLP. At this time, Pt. Appears to have functional decline and is a good candidate for CIR. Will place order for rehab consult per protocol.  Please contact me with questions.   Keedan Sample, MS, CCC-SLP Rehab Admissions Coordinator  [phone] (celll) [phone] (office)  

## 2021-01-30 NOTE — Progress Notes (Signed)
PROGRESS NOTE    Ryan Lynn  [id number] DOB: October 03, 1934 DOA: 01/25/2021 PCP: Shon Baton, MD   Brief Narrative:  87 yo male fell down stairs at home and brought to ER on 01/25/21.  Found to have [place], Syncope, SDH, A fib, Parotic cancer s/p XRT, GERD, ED, Glaucoma bilateral, Aspiration pneumonia complicated by C diff in 2016   CT head 1/26 > ICH with small right parietal SDH with mild mass effect, no MLS.  Mild SAH of medial right temporal lobe.  Echo 1/27 > EF 45%, grade 1 DD, RVSP 34.1 mmHg, aortic root 41 mm  Carotid duplex 1/27 > Right Carotid: Velocities in the right ICA are consistent with a 1-39% stenosis.  Left Carotid: Velocities in the left ICA are consistent with a 1-39% stenosis.  Vertebrals: Right vertebral artery demonstrates antegrade flow. Left vertebral artery was not visualized.   CT head 1/27 >> no change in Rt posterior SDH   Assessment & Plan:   Principal Problem:   Syncope and collapse Active Problems:   SAH (subarachnoid hemorrhage) (HCC)   SDH (subdural hematoma) (HCC)   Atrial fibrillation with rapid ventricular response (HCC)   Scalp laceration   Fall  Traumatic SDH and SAH secondary to syncopal event(see below). - seen by neurosurgery - imaging studies stable -no further intervention or imaging unless otherwise specified by neurosurgery -7 days of Keppra per neurosurgery recommendation - tentatively to stop February 02, 2021 - Patient and wife previously refused as they are concerned that Keppra is causing patient's diarrhea/fevers/chills and currently refusing any further treatment we discussed the risks of stopping Keppra or increased risk for post-traumatic seizure. -Would likely benefit from outpt follow up with neurology (previously seen by Dr. Antony Contras)  Syncopal event with new onset systolic CHF. PAC, PVCs. Aortic root dilation. Reported hx of PAF. Hypertension. -Unclear etiology, continue to follow clinically  -Continue carvedilol   -Frequent PVCs noted on telemetry -Cardiology previously following given new reduced EF at 45% -no further recommendations, signed off as of January 28, 2021  Dysphagia in setting of prior hx of parotic cancer s/p XRT. Cachexia. - speech therapy consult to assess swallowing - worsening wet upper airway cough on 1/31 - concern he's not clearing very well - nutrition consult to assess caloric needs  - continue IV fluids until able to tolerate diet  Glaucoma. - continue eye drops  Urinary outlet obstruction, unspecified - Bladder scan overnight showed large residual volume - I/O catheter offered overnight but patient refused - Add flomax - follow UOP/symptoms  Deconditioning, chronic -Continue PT OT  DVT prophylaxis: None in the setting of above, early ambulation, SCDs only Code Status: Full Family Communication: Called wife no answer  Status is: Inpatient  Dispo: The patient is from: Home              Anticipated d/c is to: TBD -likely CIR versus SNF given ongoing weakness per PT              Anticipated d/c date is: 24-48h pending above              Patient currently NOT medically stable for discharge  Consultants:   Neurosurgery, PCCM, Cardiology  Procedures:   None  Antimicrobials:  None   Subjective: Overnight signout indicates patient had very large volume bladder scan but refused in and out cath, patient states he made "enough urine" but unclear if he truly completely voided his bladder or not.  Otherwise denies abdominal pain fullness,  nausea, vomiting, diarrhea, constipation, headache, fevers, chills.  He also does complain of ongoing cough associated with his milkshake and thickened liquids.  Objective: Vitals:   01/29/21 1803 01/29/21 2000 01/30/21 0006 01/30/21 0359  BP: (!) 159/88 139/71 (!) 148/79 (!) 148/74  Pulse: 89 88 90 95  Resp: 20 18 18 18   Temp: 99.3 F (37.4 C) 99.4 F (37.4 C) 98.4 F (36.9 C) 99.8 F (37.7 C)  TempSrc: Oral Oral Oral  Oral  SpO2: 95% 96% 94% 94%  Weight:      Height:        Intake/Output Summary (Last 24 hours) at 01/30/2021 0749 Last data filed at 01/29/2021 1940 Gross per 24 hour  Intake 2384.95 ml  Output 820 ml  Net 1564.95 ml   Filed Weights   01/25/21 2340 01/26/21 0640  Weight: 69.4 kg 61.1 kg    Examination:  General exam: Appears calm and comfortable  Respiratory system: Clear to auscultation. Respiratory effort normal. Cardiovascular system: S1 & S2 heard, RRR. No JVD, murmurs, rubs, gallops or clicks. No pedal edema. Gastrointestinal system: Abdomen is nondistended, soft and nontender. No organomegaly or masses felt. Normal bowel sounds heard. Central nervous system: Alert and oriented. No focal neurological deficits. Extremities: Symmetric 5 x 5 power. Skin: No rashes, lesions or ulcers Psychiatry: Judgement and insight appear normal. Mood & affect appropriate.     Data Reviewed: I have personally reviewed following labs and imaging studies  CBC: Recent Labs  Lab 01/25/21 2345 01/26/21 0001 01/26/21 0500 01/29/21 0100 01/30/21 0146  WBC 13.4*  --  14.2* 8.2 7.7  NEUTROABS 10.6*  --   --   --   --   HGB 13.7 15.0 13.8 11.6* 12.5*  HCT 40.6 44.0 41.7 35.0* 36.0*  MCV 95.3  --  94.6 93.3 92.3  PLT 195  --  209 170 696   Basic Metabolic Panel: Recent Labs  Lab 01/25/21 2345 01/26/21 0001 01/26/21 0500 01/28/21 0138 01/29/21 0100 01/30/21 0146  NA 133* 132* 133* 134* 133* 133*  K 4.1 4.1 4.0 4.1 3.5 3.5  CL 97* 102 100 99 100 99  CO2 22  --  25 27 25 24   GLUCOSE 123* 119* 131* 129* 157* 119*  BUN 20 23 17 23  26* 15  CREATININE 0.83 0.70 0.82 0.79 0.67 0.70  CALCIUM 9.4  --  9.2 8.8* 8.6* 8.6*  MG 2.0  --  1.8 1.9  --   --   PHOS  --   --  2.4* 2.3*  --   --    GFR: Estimated Creatinine Clearance: 59.4 mL/min (by C-G formula based on SCr of 0.7 mg/dL). Liver Function Tests: Recent Labs  Lab 01/25/21 2345  AST 30  ALT 22  ALKPHOS 91  BILITOT 0.7   PROT 6.9  ALBUMIN 4.1   No results for input(s): LIPASE, AMYLASE in the last 168 hours. No results for input(s): AMMONIA in the last 168 hours. Coagulation Profile: Recent Labs  Lab 01/25/21 2345  INR 1.0   Cardiac Enzymes: No results for input(s): CKTOTAL, CKMB, CKMBINDEX, TROPONINI in the last 168 hours. BNP (last 3 results) No results for input(s): PROBNP in the last 8760 hours. HbA1C: No results for input(s): HGBA1C in the last 72 hours. CBG: Recent Labs  Lab 01/26/21 0653 01/26/21 0735 01/28/21 2124 01/29/21 0638  GLUCAP 121* 135* 211* 107*   Lipid Profile: No results for input(s): CHOL, HDL, LDLCALC, TRIG, CHOLHDL, LDLDIRECT in the last 72 hours. Thyroid  Function Tests: No results for input(s): TSH, T4TOTAL, FREET4, T3FREE, THYROIDAB in the last 72 hours. Anemia Panel: No results for input(s): VITAMINB12, FOLATE, FERRITIN, TIBC, IRON, RETICCTPCT in the last 72 hours. Sepsis Labs: Recent Labs  Lab 01/25/21 2345  LATICACIDVEN 1.9    Recent Results (from the past 240 hour(s))  SARS Coronavirus 2 by RT PCR (hospital order, performed in [facility] hospital lab) Nasopharyngeal Nasopharyngeal Swab     Status: None   Collection Time: 01/26/21 12:47 AM   Specimen: Nasopharyngeal Swab  Result Value Ref Range Status   SARS Coronavirus 2 NEGATIVE NEGATIVE Final    Comment: (NOTE) SARS-CoV-2 target nucleic acids are NOT DETECTED.  The SARS-CoV-2 RNA is generally detectable in upper and lower respiratory specimens during the acute phase of infection. The lowest concentration of SARS-CoV-2 viral copies this assay can detect is 250 copies / mL. A negative result does not preclude SARS-CoV-2 infection and should not be used as the sole basis for treatment or other patient management decisions.  A negative result may occur with improper specimen collection / handling, submission of specimen other than nasopharyngeal swab, presence of viral mutation(s) within the areas  targeted by this assay, and inadequate number of viral copies (<250 copies / mL). A negative result must be combined with clinical observations, patient history, and epidemiological information.  Fact Sheet for Patients:   StrictlyIdeas.no  Fact Sheet for Healthcare Providers: BankingDealers.co.za  This test is not yet approved or  cleared by the [place] FDA and has been authorized for detection and/or diagnosis of SARS-CoV-2 by FDA under an Emergency Use Authorization (EUA).  This EUA will remain in effect (meaning this test can be used) for the duration of the COVID-19 declaration under Section 564(b)(1) of the Act, 21 U.S.C. section 360bbb-3(b)(1), unless the authorization is terminated or revoked sooner.  Performed at [facility], [place] [address] [place], [place] [address]   MRSA PCR Screening     Status: None   Collection Time: 01/26/21  6:41 AM   Specimen: Nasopharyngeal  Result Value Ref Range Status   MRSA by PCR NEGATIVE NEGATIVE Final    Comment:        The GeneXpert MRSA Assay (FDA approved for NASAL specimens only), is one component of a comprehensive MRSA colonization surveillance program. It is not intended to diagnose MRSA infection nor to guide or monitor treatment for MRSA infections. Performed at [facility], [place] [address] [place], [place] [address]      Radiology Studies: No results found.  Scheduled Meds: . carvedilol  3.125 mg Oral BID WC  . chlorhexidine  15 mL Mouth Rinse BID  . Chlorhexidine Gluconate Cloth  6 each Topical Daily  . cholecalciferol  2,000 Units Oral Daily  . dorzolamide  1 drop Both Eyes TID  . feeding supplement  237 mL Oral BID BM  . latanoprost  1 drop Both Eyes QHS  . levETIRAcetam  500 mg Oral BID  . mouth rinse  15 mL Mouth Rinse q12n4p  . pantoprazole  40 mg Oral Daily   Continuous Infusions: . lactated ringers 50 mL/hr at 01/29/21 1318     LOS:  4 days    Time spent: 65min  Shaleta Ruacho C Dalena Plantz, DO [facility]  If 7PM-7AM, please contact night-coverage [email or web address]  01/30/2021, 7:49 AM

## 2021-01-30 NOTE — Care Management Important Message (Signed)
Important Message  Patient Details  Name: Ryan Lynn MRN: [id number] Date of Birth: [birth year]   Medicare Important Message Given:  Yes     [name] 01/30/2021, 4:00 PM

## 2021-01-30 NOTE — Progress Notes (Signed)
Speech Language Pathology Treatment: Dysphagia  Patient Details Name: Ryan Lynn MRN: [id number] DOB: 20-Jul-1934 Today's Date: 01/30/2021 Time: 1310-1340 SLP Time Calculation (min) (ACUTE ONLY): 30 min  Assessment / Plan / Recommendation Clinical Impression  [facility] was assisting pt with lunch upon entering room.  He was sleepy this afternoon; oral intake has been minimal.  Required encouragement to open his eyes and participate. Quite weak, having difficulty drawing liquid from a straw and requiring assist with bringing cup/straw to lips.  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 Pt awakened and described the difficulty and accommodations he has had to make for his dysphagia for years.  We discussed the fluctuating ability of his body to tolerate aspiration, particularly when he is deconditioned and functional reserve is low. We discussed the value in making temporary adjustments to diet if we need to account for acute deterioration in swallow in order to bridge this period of time until he gets stronger.  He sounded congested today.  Review of recent cervical spine CT shows degenerative disc disease, particularly at C4-6, which could be having further structural impact on swallow function.  Pt may benefit from another MBS (no record in [facility], but his wife reports prior studies).  He may not agree, as he has strong opinions about his swallowing and his diet.  SLP will follow while on acute care. Continue dysphagia 3/nectars for now. Discussed the above with [facility].   HPI HPI: Pt is an 87 y.o. 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 He presented to [facility] 1/26 after he fell at home down a flight of stairs.  CT head 1/27: Acute intracranial hemorrhage with small right parietal subdural hematoma demonstrating mild  mass effect upon the right cerebral hemisphere. CXR 1/26: No acute infiltrate or edema. 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 Pt's wife reported that the had swallow studies in the past and provided a sheet with instructions which he has given. Per the wife, pt has vocal fold atrophy and must use chin tuck, effortful swallow, and drink from a straw. Pill sizes that are larger than the size of an aspirin are crushed at home. Liquids must be thickened to "mild to moderate" thickness and he consumed "soft and moist" foods. Pt's wife stated noodles, potatoes, pasta, etc, but stated that the pt cannot control soup. Pt's wife showed SLP the amount of thickener that she typically uses and it appears that he is on nectar thick liquids. CT cervical spine on admission revealed severe degenerative disc disease, particularly at C4-6.      SLP Plan  Continue with current plan of care       Recommendations  Diet recommendations: Dysphagia 3 (mechanical soft);Nectar-thick liquid Liquids provided via: Cup;Straw Medication Administration: Crushed with puree Supervision: Patient able to self feed;Staff to assist with self feeding Compensations: Slow rate;Small sips/bites;Chin tuck;Effortful swallow Postural Changes and/or Swallow Maneuvers: Seated upright 90 degrees                Oral Care Recommendations: Oral care BID Follow up Recommendations: Other (comment) (tba) SLP Visit Diagnosis: Dysphagia, pharyngeal phase (R13.13) Plan: Continue with current plan of care       GO                Ryan Lynn 01/30/2021, 2:00 PM  [facility] L.  Ryan [facility], [facility] Office number [phone] Pager [phone]

## 2021-01-30 NOTE — Progress Notes (Addendum)
Patient was bladder scanned @577ml . MD notified and got an order for in and out cath. The nurse made an attempt to cath the patient. Patient refused. Got patient up to void, patient voided little amount. MD notified

## 2021-01-31 DIAGNOSIS — R55 Syncope and collapse: Secondary | ICD-10-CM | POA: Diagnosis not present

## 2021-01-31 LAB — CBC
HCT: 33.8 % — ABNORMAL LOW (ref 39.0–52.0)
Hemoglobin: 11 g/dL — ABNORMAL LOW (ref 13.0–17.0)
MCH: 30.6 pg (ref 26.0–34.0)
MCHC: 32.5 g/dL (ref 30.0–36.0)
MCV: 93.9 fL (ref 80.0–100.0)
Platelets: 189 10*3/uL (ref 150–400)
RBC: 3.6 MIL/uL — ABNORMAL LOW (ref 4.22–5.81)
RDW: 12.8 % (ref 11.5–15.5)
WBC: 9.2 10*3/uL (ref 4.0–10.5)
nRBC: 0 % (ref 0.0–0.2)

## 2021-01-31 LAB — BASIC METABOLIC PANEL
Anion gap: 8 (ref 5–15)
BUN: 18 mg/dL (ref 8–23)
CO2: 26 mmol/L (ref 22–32)
Calcium: 8.4 mg/dL — ABNORMAL LOW (ref 8.9–10.3)
Chloride: 101 mmol/L (ref 98–111)
Creatinine, Ser: 0.68 mg/dL (ref 0.61–1.24)
GFR, Estimated: >60 mL/min (ref >60)
Glucose, Bld: 102 mg/dL — ABNORMAL HIGH (ref 70–99)
Potassium: 3.3 mmol/L — ABNORMAL LOW (ref 3.5–5.1)
Sodium: 135 mmol/L (ref 135–145)

## 2021-01-31 NOTE — Plan of Care (Signed)

## 2021-01-31 NOTE — PMR Pre-admission (Shared)
PMR Admission Coordinator Pre-Admission Assessment  Patient: Ryan Lynn is an 87 y.o., male MRN: [id number] DOB: 1934/11/10 Height: 6' (182.9 cm) Weight: 68.7 kg              Insurance Information HMO: yes    PPO:      PCP:      IPA:      80/20:      OTHER:  PRIMARY: [employer] Medicare      Policy#: [id number]      Subscriber: pt CM Name: none     Phone#: [phone]     Fax#: [phone] Received authorization for admit on 2/4 from [place] for admission 2/4 for 7 days, with clinical updates due 02/22/2021. Pre-Cert#: [id number]      Employer:  Benefits:  Phone #: [email or web address]   Name:  Irene Shipper Date: 12/31/2020 - present Deductible: $0 (does not have deductible) OOP Max: $3,600 ($30 met) CIR: $295/day co-pay for days 1-5, $0/day co-pay for days 6+ SNF: $0/day copay for days 1-20, $188/day copay for days 21-40, $0/day copay for days 41-100; limited to 100 days/cal yr Outpatient: $30/visit co-pay; Home Health:  100% coverage, 0% co-insurance; limited by medical necessity DME: 80% coverage; 20% co-insurance Providers: in network   SECONDARY:none       Policy#:       Phone#:   Development worker, community:       Phone#:   [employer]" for patients in Inpatient Rehabilitation Facilities with attached "Privacy Act [place] Records" was provided and verbally reviewed with: Patient  Emergency Contact Information Contact Information    Name Relation Home Work Ryan [facility], [facility]   [phone]     Current Medical History  Patient Admitting Diagnosis: [place] History of Present Illness: [facility] is a 87 y.o. right-handed male with history of glaucoma, prostate cancer with radiation therapy, as well as small [place] 2021 after syncopal episode.  Per chart review patient lives with spouse.  Independent with assistive device.  Two-level home half bath on main level as well as bedroom.  Presented 01/25/2021 after a fall down a flight of stairs.  Transient loss of  consciousness.  Cranial CT scan showed acute intracranial hemorrhage with small right parietal subdural hematoma demonstrating mild mass-effect upon the right cerebral hemisphere.  No midline shift.  Additionally, mild subarachnoid hemorrhage seen involving the medial right temporal lobe.  CT cervical spine negative.  Admission chemistries unremarkable except sodium 133, glucose 123, troponin negative, BNP 171.  Neurosurgery followed Dr. Duffy Rhody conservative care with follow-up CT unchanged size of right posterior subdural hematoma no mass-effect.  Keppra added for seizure prophylaxis x7 days.  Hospital course follow-up cardiology services questionable atrial fibrillation.  Carotid Dopplers with no ICA stenosis.  Echocardiogram with ejection fraction of 98% grade 1 diastolic dysfunction currently with plan of conservative care follow-up outpatient..  Currently maintained on a dysphagia #3 nectar thick liquid diet.  Physical Medicine & Rehabilitation was consulted to assess candidacy for CIR given altered mental status and decreased functional ability.   Complete NIHSS TOTAL: 3 Glasgow Coma Scale Score: 15  Past Medical History  History reviewed. No pertinent past medical history.  Family History  family history is not on file.  Prior Rehab/Hospitalizations:  Has the patient had prior rehab or hospitalizations prior to admission? Yes  Has the patient had major surgery during 100 days prior to admission? No  Current Medications   Current Facility-Administered Medications:  .  carvedilol (COREG) tablet  3.125 mg, 3.125 mg, Oral, BID WC, Sood, Vineet, MD, 3.125 mg at 02/02/21 1059 .  chlorhexidine (PERIDEX) 0.12 % solution 15 mL, 15 mL, Mouth Rinse, BID, Halford Chessman, Vineet, MD, 15 mL at 02/02/21 0851 .  Chlorhexidine Gluconate Cloth 2 % PADS 6 each, 6 each, Topical, Daily, Chesley Mires, MD, 6 each at 02/02/21 1101 .  cholecalciferol (VITAMIN D3) tablet 2,000 Units, 2,000 Units, Oral, Daily, Chesley Mires, MD, 2,000 Units at 02/02/21 1058 .  docusate sodium (COLACE) capsule 100 mg, 100 mg, Oral, BID PRN, Halford Chessman, Vineet, MD .  dorzolamide (TRUSOPT) 2 % ophthalmic solution 1 drop, 1 drop, Both Eyes, TID, Sood, Vineet, MD, 1 drop at 02/02/21 1101 .  feeding supplement (ENSURE ENLIVE / ENSURE PLUS) liquid 237 mL, 237 mL, Oral, TID BM, Little Ishikawa, MD, 237 mL at 02/01/21 2000 .  guaiFENesin (ROBITUSSIN) 100 MG/5ML solution 100 mg, 5 mL, Oral, Q4H PRN, Little Ishikawa, MD, 100 mg at 02/01/21 [id number] .  labetalol (NORMODYNE) injection 10 mg, 10 mg, Intravenous, Q2H PRN, Sood, Vineet, MD .  latanoprost (XALATAN) 0.005 % ophthalmic solution 1 drop, 1 drop, Both Eyes, QHS, Sood, Vineet, MD, 1 drop at 02/01/21 2233 .  levETIRAcetam (KEPPRA) 100 MG/ML solution 500 mg, 500 mg, Oral, BID, Little Ishikawa, MD, 500 mg at 02/02/21 1057 .  loperamide HCl (IMODIUM) 1 MG/7.5ML suspension 2 mg, 2 mg, Oral, PRN, Little Ishikawa, MD .  MEDLINE mouth rinse, 15 mL, Mouth Rinse, q12n4p, Sood, Vineet, MD, 15 mL at 02/02/21 1106 .  pantoprazole (PROTONIX) EC tablet 40 mg, 40 mg, Oral, Daily, Sood, Vineet, MD, 40 mg at 02/02/21 1059 .  polyethylene glycol (MIRALAX / GLYCOLAX) packet 17 g, 17 g, Oral, Daily PRN, Chesley Mires, MD .  Sara [place], , Oral, PRN, Chesley Mires, MD .  tamsulosin [facility]) capsule 0.4 mg, 0.4 mg, Oral, Daily, Little Ishikawa, MD, 0.4 mg at 02/02/21 1106  Patients Current Diet:  Diet Order            DIET DYS 3 Room service appropriate? Yes with Assist; Fluid consistency: Honey Thick  Diet effective now                 Precautions / Restrictions Precautions Precautions: Fall Precaution Comments: obturator for eating, HOH speak to right ear Restrictions Weight Bearing Restrictions: No   Has the patient had 2 or more falls or a fall with injury in the past year?Yes  Prior Activity Level Limited Community (1-2x/wk): Pt. went out for appointments and  occasional errands  Prior Functional Level Prior Function Level of Independence: Independent with assistive device(s) Comments: used cane outdoors and in community (ises Obtuator due to past mouth surgery/radiation)  Self Care: Did the patient need help bathing, dressing, using the toilet or eating?  Independent  Indoor Mobility: Did the patient need assistance with walking from room to room (with or without device)? Independent  Stairs: Did the patient need assistance with internal or external stairs (with or without device)? Independent  Functional Cognition: Did the patient need help planning regular tasks such as shopping or remembering to take medications? Independent  Home Assistive Devices / Equipment Home Equipment: Cane - single point  Prior Device Use: Indicate devices/aids used by the patient prior to current illness, exacerbation or injury? None of the above  Current Functional Level Cognition  Overall Cognitive Status: Impaired/Different from baseline Current Attention Level: Sustained Orientation Level: Oriented X4 Following Commands: Follows one step commands consistently,Follows  one step commands with increased time,Follows multi-step commands with increased time Safety/Judgement: Decreased awareness of safety,Decreased awareness of deficits General Comments: Pt requires repeated multi-modal cues for sequencing with transfers and mobility utilizing RW. Cues for maintaining safety and to manage lines/leads with assistance.    Extremity Assessment (includes Sensation/Coordination)  Upper Extremity Assessment: Defer to OT evaluation LUE Deficits / Details: LUE deficits from radiation; dropping items at times with L hand  Lower Extremity Assessment: Generalized weakness    ADLs  Overall ADL's : Needs assistance/impaired Grooming: Minimal assistance,Sitting Upper Body Bathing: Minimal assistance,Sitting Lower Body Bathing: Moderate assistance,Sit to/from  stand Upper Body Dressing : Minimal assistance,Sitting Lower Body Dressing: Moderate assistance,Sit to/from stand Toilet Transfer: Minimal assistance,+2 for safety/equipment,RW Toilet Transfer Details (indicate cue type and reason): Requires Mod A at times wtihout RW due to posterior lean and L bias Toileting- Clothing Manipulation and Hygiene: Moderate assistance Functional mobility during ADLs: Minimal assistance,+2 for safety/equipment,Rolling walker,Cueing for safety,Cueing for sequencing    Mobility  Overal bed mobility: Needs Assistance Bed Mobility: Sit to Supine Rolling: Min assist Sidelying to sit: Min assist Supine to sit: HOB elevated,Total assist,+2 for safety/equipment Sit to supine: Min assist General bed mobility comments: Increased time and assistance at legs to complete ascension onto bed and laterally scooting them to get pt midline in bed. MinA for superior transition in bed, cuing pt to pull up on rails.    Transfers  Overall transfer level: Needs assistance Equipment used: Rolling walker (2 wheeled) Transfers: Sit to/from [employer] Sit to Stand: Min assist Stand pivot transfers: Min assist General transfer comment: Visual demonstration with verbal cues for hands to push up on seated surface then once buttocks cleared to transition one hand at a time to RW, success. Extra time and minA to power up to stand and manage RW and cue to sequence steps to side step to L to commode.    Ambulation / Gait / Stairs / Wheelchair Mobility  Ambulation/Gait Ambulation/Gait assistance: Herbalist (Feet): 72 Feet (x2 bouts of ~3 ft > ~72 ft) Assistive device: Rolling walker (2 wheeled) Gait Pattern/deviations: Decreased stride length,Step-through pattern,Trunk flexed,Shuffle,Decreased step length - right,Decreased step length - left General Gait Details: flexed posture and decreased feet clearance and step length, cuing to correct with min success.  Assistance to maintain balance, manage RW during turns, and manage lines/leads. Gait velocity: reduced Gait velocity interpretation: <1.31 ft/sec, indicative of household ambulator    Posture / Balance Dynamic Sitting Balance Sitting balance - Comments: UE support to lift leg to donn/doff pants. Balance Overall balance assessment: Needs assistance Sitting-balance support: Feet supported,Bilateral upper extremity supported Sitting balance-Leahy Scale: Poor Sitting balance - Comments: UE support to lift leg to donn/doff pants. Postural control: Posterior lean,Other (comment) (L bias) Standing balance support: Bilateral upper extremity supported Standing balance-Leahy Scale: Poor Standing balance comment: RW for support with flexed posture    Special needs/care consideration Skin ecchymosis, abrasions and Designated visitor [facility] (from acute therapy documentation) Living Arrangements: Spouse/significant other Available Help at Discharge: Family,Available 24 hours/day Type of Home: House Home Layout: Two level,Multi-level,1/2 bath on main level,Able to live on main level with bedroom/bathroom,Bed/bath upstairs Alternate Level Stairs-Number of Steps: flight Home Access: Stairs to enter Entrance Stairs-Rails: Right,Left Entrance Stairs-Number of Steps: 5 (17 on deck) Bathroom Shower/Tub: Multimedia programmer: Standard Bathroom Accessibility: Yes How Accessible: Accessible via walker  Discharge Living Setting Plans for Discharge Living Setting: House  Type of Home at Discharge: House Discharge Home Layout: One level Discharge Home Access: Stairs to enter Entrance Stairs-Rails: Right,Can reach both,Left Entrance Stairs-Number of Steps: 10-14 days Discharge Bathroom Shower/Tub: Walk-in shower Discharge Bathroom Toilet: Standard Discharge Bathroom Accessibility: Yes How Accessible: Accessible via wheelchair,Accessible via walker Does the  patient have any problems obtaining your medications?: No  Social/Family/Support Systems Patient Roles: Spouse Contact Information: [phone] Anticipated Caregiver: [facility] Anticipated Caregiver's Contact Information: [phone] Ability/Limitations of Caregiver: Can provide min A Caregiver Availability: 24/7 Discharge Plan Discussed with Primary Caregiver: Yes Is Caregiver In Agreement with Plan?: Yes Does Caregiver/Family have Issues with Lodging/Transportation while Pt is in Rehab?: No   Goals Patient/Family Goal for Rehab: PT/OT/ mod I, SLP Supervision Expected length of stay: 10-14 days Pt/Family Agrees to Admission and willing to participate: Yes Program Orientation Provided & Reviewed with Pt/Caregiver Including Roles  & Responsibilities: Yes   Decrease burden of Care through IP rehab admission: NA   Possible need for SNF placement upon discharge:not anticipated   Patient Condition: {PATIENT'S CONDITION:22832}  Preadmission Screen Completed By:  Genella Mech, CCC-SLP, 01/31/2021 3:12 PM with day of admit updates by: *** ______________________________________________________________________   Discussed status with Dr. Marland Kitchenon***at *** and received approval for admission today.  Admission Coordinator:  Genella Mech, with day of admit updates by: *** time***/Date***

## 2021-01-31 NOTE — Progress Notes (Signed)
PROGRESS NOTE    Ryan Lynn  [id number] DOB: [birth year] DOA: 01/25/2021 PCP: Shon Baton, MD   Brief Narrative:  87 yo male fell down stairs at home and brought to ER on 01/25/21.  Found to have [place], Syncope, SDH, A fib, Parotic cancer s/p XRT, GERD, ED, Glaucoma bilateral, Aspiration pneumonia complicated by C diff in 2016   CT head 1/26 > ICH with small right parietal SDH with mild mass effect, no MLS.  Mild SAH of medial right temporal lobe.  Echo 1/27 > EF 45%, grade 1 DD, RVSP 34.1 mmHg, aortic root 41 mm  Carotid duplex 1/27 > Right Carotid: Velocities in the right ICA are consistent with a 1-39% stenosis.  Left Carotid: Velocities in the left ICA are consistent with a 1-39% stenosis.  Vertebrals: Right vertebral artery demonstrates antegrade flow. Left vertebral artery was not visualized.   CT head 1/27 >> no change in Rt posterior SDH   Assessment & Plan:   Principal Problem:   Syncope and collapse Active Problems:   SAH (subarachnoid hemorrhage) (HCC)   SDH (subdural hematoma) (HCC)   Atrial fibrillation with rapid ventricular response (HCC)   Scalp laceration   Fall  Traumatic SDH and SAH secondary to syncopal event(see below). - Evaluated by neurosurgery at intake -  imaging studies stable -no further intervention or imaging unless otherwise specified by neurosurgery -7 days of Keppra per neurosurgery recommendation - tentatively to stop February 02, 2021 - Patient and wife previously refused as they are concerned that Keppra is causing patient's diarrhea/fevers/chills and currently refusing any further treatment we discussed the risks of stopping Keppra or increased risk for post-traumatic seizure. -Would likely benefit from outpt follow up with neurology (previously seen by Dr. Antony Contras)  Syncopal event with new onset systolic CHF, POA PAC, PVCs. Aortic root dilation. Reported hx of PAF. Hypertension. -Unclear etiology, continue to follow clinically   -Continue carvedilol  -Frequent PVCs noted on telemetry -Cardiology previously following given new reduced EF at 45% -no further recommendations, signed off as of January 28, 2021  Chronic dysphagia in setting of prior hx of parotic cancer s/p XRT. Cachexia. - speech therapy continues to follow - continues to have weak wet cough - OOB to chair for all meals - no more eating/drinking while suppine - High risk for aspiration given his history - nutrition consult to assess caloric needs  - continue LR @50cc  until able to tolerate diet appropriately (Having difficulty with thickened liquids/straws)  Glaucoma. - continue eye drops  Urinary outlet obstruction, unspecified - Bladder scan overnight showed large residual volume - I/O catheter offered overnight but patient refused - Add flomax - follow UOP/symptoms  Deconditioning, chronic -Continue PT OT  DVT prophylaxis: None in the setting of above, early ambulation, SCDs only Code Status: Full Family Communication: Wife at bedside this am  Status is: Inpatient  Dispo: The patient is from: Home              Anticipated d/c is to: TBD - CIR cannot offer a bed until next week.              Anticipated d/c date is: 24-48h pending above              Patient currently NOT medically stable for discharge  Consultants:   Neurosurgery, PCCM, Cardiology  Procedures:   None  Antimicrobials:  None   Subjective: Lower pelvis fullness/pain this am with urinary obstruction - agreed to foley placement this am.  Otherwise denies abdominal pain fullness, nausea, vomiting, diarrhea, constipation, headache, fevers, chills.  He also does complain of ongoing cough associated with his milkshake and thickened liquids.  Objective: Vitals:   01/30/21 1615 01/30/21 2048 01/31/21 0022 01/31/21 0424  BP: 116/60 124/69 112/69 129/68  Pulse: 88 87 94 82  Resp: 18 18 18 18   Temp: 99.8 F (37.7 C) 98.5 F (36.9 C) 98.8 F (37.1 C) 97.9 F (36.6  C)  TempSrc: Oral Oral Oral Oral  SpO2: 92% 92% 92% 93%  Weight:      Height:       No intake or output data in the 24 hours ending 01/31/21 0734 Filed Weights   01/25/21 2340 01/26/21 0640  Weight: 69.4 kg 61.1 kg    Examination:  General exam: Appears calm and comfortable  Respiratory system: Clear to auscultation. Respiratory effort normal. Cardiovascular system: S1 & S2 heard, RRR. No JVD, murmurs, rubs, gallops or clicks. No pedal edema. Gastrointestinal system: Abdomen is nondistended, soft and nontender. No organomegaly or masses felt. Normal bowel sounds heard. Central nervous system: Alert and oriented. No focal neurological deficits. Extremities: Symmetric 5 x 5 power. Skin: No rashes, lesions or ulcers Psychiatry: Judgement and insight appear normal. Mood & affect appropriate.   Data Reviewed: I have personally reviewed following labs and imaging studies  CBC: Recent Labs  Lab 01/25/21 2345 01/26/21 0001 01/26/21 0500 01/29/21 0100 01/30/21 0146 01/31/21 0246  WBC 13.4*  --  14.2* 8.2 7.7 9.2  NEUTROABS 10.6*  --   --   --   --   --   HGB 13.7 15.0 13.8 11.6* 12.5* 11.0*  HCT 40.6 44.0 41.7 35.0* 36.0* 33.8*  MCV 95.3  --  94.6 93.3 92.3 93.9  PLT 195  --  209 170 207 144   Basic Metabolic Panel: Recent Labs  Lab 01/25/21 2345 01/26/21 0001 01/26/21 0500 01/28/21 0138 01/29/21 0100 01/30/21 0146 01/31/21 0246  NA 133*   < > 133* 134* 133* 133* 135  K 4.1   < > 4.0 4.1 3.5 3.5 3.3*  CL 97*   < > 100 99 100 99 101  CO2 22  --  25 27 25 24 26   GLUCOSE 123*   < > 131* 129* 157* 119* 102*  BUN 20   < > 17 23 26* 15 18  CREATININE 0.83   < > 0.82 0.79 0.67 0.70 0.68  CALCIUM 9.4  --  9.2 8.8* 8.6* 8.6* 8.4*  MG 2.0  --  1.8 1.9  --   --   --   PHOS  --   --  2.4* 2.3*  --   --   --    < > = values in this interval not displayed.   GFR: Estimated Creatinine Clearance: 59.4 mL/min (by C-G formula based on SCr of 0.68 mg/dL). Liver Function  Tests: Recent Labs  Lab 01/25/21 2345  AST 30  ALT 22  ALKPHOS 91  BILITOT 0.7  PROT 6.9  ALBUMIN 4.1   No results for input(s): LIPASE, AMYLASE in the last 168 hours. No results for input(s): AMMONIA in the last 168 hours. Coagulation Profile: Recent Labs  Lab 01/25/21 2345  INR 1.0   Cardiac Enzymes: No results for input(s): CKTOTAL, CKMB, CKMBINDEX, TROPONINI in the last 168 hours. BNP (last 3 results) No results for input(s): PROBNP in the last 8760 hours. HbA1C: No results for input(s): HGBA1C in the last 72 hours. CBG: Recent Labs  Lab  01/26/21 0653 01/26/21 0735 01/28/21 2124 01/29/21 0638  GLUCAP 121* 135* 211* 107*   Lipid Profile: No results for input(s): CHOL, HDL, LDLCALC, TRIG, CHOLHDL, LDLDIRECT in the last 72 hours. Thyroid Function Tests: No results for input(s): TSH, T4TOTAL, FREET4, T3FREE, THYROIDAB in the last 72 hours. Anemia Panel: No results for input(s): VITAMINB12, FOLATE, FERRITIN, TIBC, IRON, RETICCTPCT in the last 72 hours. Sepsis Labs: Recent Labs  Lab 01/25/21 2345  LATICACIDVEN 1.9    Recent Results (from the past 240 hour(s))  SARS Coronavirus 2 by RT PCR (hospital order, performed in [facility] hospital lab) Nasopharyngeal Nasopharyngeal Swab     Status: None   Collection Time: 01/26/21 12:47 AM   Specimen: Nasopharyngeal Swab  Result Value Ref Range Status   SARS Coronavirus 2 NEGATIVE NEGATIVE Final    Comment: (NOTE) SARS-CoV-2 target nucleic acids are NOT DETECTED.  The SARS-CoV-2 RNA is generally detectable in upper and lower respiratory specimens during the acute phase of infection. The lowest concentration of SARS-CoV-2 viral copies this assay can detect is 250 copies / mL. A negative result does not preclude SARS-CoV-2 infection and should not be used as the sole basis for treatment or other patient management decisions.  A negative result may occur with improper specimen collection / handling, submission of  specimen other than nasopharyngeal swab, presence of viral mutation(s) within the areas targeted by this assay, and inadequate number of viral copies (<250 copies / mL). A negative result must be combined with clinical observations, patient history, and epidemiological information.  Fact Sheet for Patients:   StrictlyIdeas.no  Fact Sheet for Healthcare Providers: BankingDealers.co.za  This test is not yet approved or  cleared by the [place] FDA and has been authorized for detection and/or diagnosis of SARS-CoV-2 by FDA under an Emergency Use Authorization (EUA).  This EUA will remain in effect (meaning this test can be used) for the duration of the COVID-19 declaration under Section 564(b)(1) of the Act, 21 U.S.C. section 360bbb-3(b)(1), unless the authorization is terminated or revoked sooner.  Performed at [facility], [place] [address], [place], [place] [address]   MRSA PCR Screening     Status: None   Collection Time: 01/26/21  6:41 AM   Specimen: Nasopharyngeal  Result Value Ref Range Status   MRSA by PCR NEGATIVE NEGATIVE Final    Comment:        The GeneXpert MRSA Assay (FDA approved for NASAL specimens only), is one component of a comprehensive MRSA colonization surveillance program. It is not intended to diagnose MRSA infection nor to guide or monitor treatment for MRSA infections. Performed at [facility], [place] [address], [place], [place] [address]      Radiology Studies: No results found.  Scheduled Meds: . carvedilol  3.125 mg Oral BID WC  . chlorhexidine  15 mL Mouth Rinse BID  . Chlorhexidine Gluconate Cloth  6 each Topical Daily  . cholecalciferol  2,000 Units Oral Daily  . dorzolamide  1 drop Both Eyes TID  . feeding supplement  237 mL Oral BID BM  . latanoprost  1 drop Both Eyes QHS  . levETIRAcetam  500 mg Oral BID  . mouth rinse  15 mL Mouth Rinse q12n4p  . pantoprazole  40 mg Oral  Daily  . tamsulosin  0.4 mg Oral Daily   Continuous Infusions: . lactated ringers 50 mL/hr at 01/30/21 1305     LOS: 5 days    Time spent: 41min  Gwyndolyn Saxon  Loraine Grip, DO [facility]  If 7PM-7AM, please contact night-coverage [email or web address]  01/31/2021, 7:34 AM

## 2021-01-31 NOTE — Progress Notes (Signed)
Per MD order, 14 fr indwelling foley catheter inserted at 1125 after this nurse discuss and explain the procedure to  patient and family. 1200 ml of urine noted in bag with clear amber color. Patient tolerated procedure well without any complaint. Patient states "you made my pain go away."  Will continue to monitor and support patient.

## 2021-01-31 NOTE — Progress Notes (Signed)
Inpatient Rehab Admissions Coordinator:   Met with patient at bedside to discuss potential CIR admission. Pt. Stated interest. And his wife can provide 24/7 support. I will open a case with his insurance, Will pursue for potential admit next week, pending bed availability and insurance authorization.   Clemens Catholic, [place], [place] Admissions Coordinator  [phone] ([place]) [phone] (office)

## 2021-02-01 DIAGNOSIS — R55 Syncope and collapse: Secondary | ICD-10-CM | POA: Diagnosis not present

## 2021-02-01 LAB — BASIC METABOLIC PANEL
Anion gap: 9 (ref 5–15)
BUN: 19 mg/dL (ref 8–23)
CO2: 25 mmol/L (ref 22–32)
Calcium: 8.2 mg/dL — ABNORMAL LOW (ref 8.9–10.3)
Chloride: 99 mmol/L (ref 98–111)
Creatinine, Ser: 0.68 mg/dL (ref 0.61–1.24)
GFR, Estimated: >60 mL/min (ref >60)
Glucose, Bld: 118 mg/dL — ABNORMAL HIGH (ref 70–99)
Potassium: 3.2 mmol/L — ABNORMAL LOW (ref 3.5–5.1)
Sodium: 133 mmol/L — ABNORMAL LOW (ref 135–145)

## 2021-02-01 LAB — CBC
HCT: 29.7 % — ABNORMAL LOW (ref 39.0–52.0)
Hemoglobin: 10.3 g/dL — ABNORMAL LOW (ref 13.0–17.0)
MCH: 32.2 pg (ref 26.0–34.0)
MCHC: 34.7 g/dL (ref 30.0–36.0)
MCV: 92.8 fL (ref 80.0–100.0)
Platelets: 208 10*3/uL (ref 150–400)
RBC: 3.2 MIL/uL — ABNORMAL LOW (ref 4.22–5.81)
RDW: 13 % (ref 11.5–15.5)
WBC: 7.9 10*3/uL (ref 4.0–10.5)
nRBC: 0 % (ref 0.0–0.2)

## 2021-02-01 MED ORDER — POTASSIUM CHLORIDE 20 MEQ PO PACK
40.0000 meq | PACK | Freq: Once | ORAL | Status: AC
  Administered 2021-02-01: 40 meq via ORAL
  Filled 2021-02-01: qty 2

## 2021-02-01 MED ORDER — LOPERAMIDE HCL 1 MG/7.5ML PO SUSP
2.0000 mg | ORAL | Status: DC | PRN
  Filled 2021-02-01: qty 15

## 2021-02-01 MED ORDER — ENSURE ENLIVE PO LIQD
237.0000 mL | Freq: Three times a day (TID) | ORAL | Status: DC
  Administered 2021-02-01 – 2021-02-06 (×6): 237 mL via ORAL

## 2021-02-01 NOTE — Progress Notes (Signed)
PROGRESS NOTE    KYI ROMANELLO  OEU:[id number] DOB: [birth year] DOA: 01/25/2021 PCP: Shon Baton, MD   Brief Narrative:  87 yo male fell down stairs at home and brought to ER on 01/25/21.  Found to have [place], Syncope, SDH, A fib, Parotic cancer s/p XRT, GERD, ED, Glaucoma bilateral, Aspiration pneumonia complicated by C diff in 2016   CT head 1/26 > ICH with small right parietal SDH with mild mass effect, no MLS.  Mild SAH of medial right temporal lobe.  Echo 1/27 > EF 45%, grade 1 DD, RVSP 34.1 mmHg, aortic root 41 mm  Carotid duplex 1/27 > Right Carotid: Velocities in the right ICA are consistent with a 1-39% stenosis.  Left Carotid: Velocities in the left ICA are consistent with a 1-39% stenosis.  Vertebrals: Right vertebral artery demonstrates antegrade flow. Left vertebral artery was not visualized.   CT head 1/27 >> no change in Rt posterior SDH   Assessment & Plan:   Principal Problem:   Syncope and collapse Active Problems:   SAH (subarachnoid hemorrhage) (HCC)   SDH (subdural hematoma) (HCC)   Atrial fibrillation with rapid ventricular response (HCC)   Scalp laceration   Fall   Traumatic SDH and SAH secondary to syncopal event(see below). - Evaluated by neurosurgery at intake -  imaging studies stable -no further intervention or imaging unless otherwise specified by neurosurgery - 7 days of Keppra per neurosurgery recommendation - tentatively to stop February 02, 2021 - - Would likely benefit from outpt follow up with neurology (previously seen by Dr. Antony Contras)  Syncopal event with new onset systolic CHF, POA PAC, PVCs. Aortic root dilation. Reported hx of PAF. Hypertension. -Unclear etiology, continue to follow clinically  -Continue carvedilol  -Frequent PVCs noted on telemetry -Cardiology previously following given new reduced EF at 45% -no further recommendations, signed off as of January 28, 2021  Acute on chronic dysphagia in setting of prior hx of parotic  cancer s/p XRT. Severe protein caloric malnutrition and cachexia. - Speech therapy continues to follow -appreciate insight and recommendations - Continue flutter, incentive spirometry: continues to have weak wet cough - OOB to chair for all meals - no more eating/drinking while suppine - High risk for aspiration given his history - Unable to suction at bedside this morning due to weakness and anatomical changes in the setting of radiation and cancer. - Nutrition consult to assess caloric needs  - continue LR @50cc  until able to tolerate diet appropriately (Having difficulty with thickened liquids/straws currently)  Glaucoma. - continue eye drops  -Dorzolamide 3 times daily, latanoprost nightly **patient reports he has been taking latanoprost 3 times daily and dorzolamide nightly - we will follow-up with pharmacy and ophthalmology**  Urinary outlet obstruction, unspecified - Bladder scan overnight showed large residual volume - I/O catheter offered overnight but patient refused - Add flomax - follow UOP/symptoms  Deconditioning, chronic -Continue PT OT  DVT prophylaxis: None in the setting of above, early ambulation, SCDs only Code Status: Full Family Communication: Wife at bedside this am, lengthy discussion about patient's prognosis, multiple issues and ultimate disposition to CIR versus SNF pending his clinical status  Status is: Inpatient  Dispo: The patient is from: Home              Anticipated d/c is to: TBD - CIR cannot offer a bed until next week.              Anticipated d/c date is: 24-48h pending above  Patient currently NOT medically stable for discharge  Consultants:   Neurosurgery, PCCM, Cardiology  Procedures:   None  Antimicrobials:  None   Subjective: No acute issues or events overnight, patient continues to complain of generalized cough poor clearance of saliva and oral liquids, as well as ongoing diarrhea/soft stool.  He otherwise denies  chest pain shortness of breath nausea vomiting constipation headache fevers or chills.  Objective: Vitals:   01/31/21 1935 01/31/21 1939 02/01/21 0002 02/01/21 0359  BP: 112/60  (!) 106/55 129/67  Pulse: 91 88 88 87  Resp: 18  18 18   Temp: 99.1 F (37.3 C)  98.6 F (37 C) 97.7 F (36.5 C)  TempSrc: Oral  Oral Oral  SpO2: 90% 95% 96% 93%  Weight:      Height:        Intake/Output Summary (Last 24 hours) at 02/01/2021 0755 Last data filed at 02/01/2021 0600 Gross per 24 hour  Intake 200 ml  Output 1200 ml  Net -1000 ml   Filed Weights   01/25/21 2340 01/26/21 0640  Weight: 69.4 kg 61.1 kg    Examination:  General exam: Appears calm and comfortable  Respiratory system: Clear to auscultation. Respiratory effort normal. Cardiovascular system: S1 & S2 heard, RRR. No JVD, murmurs, rubs, gallops or clicks. No pedal edema. Gastrointestinal system: Abdomen is nondistended, soft and nontender. No organomegaly or masses felt. Normal bowel sounds heard. Central nervous system: Alert and oriented. No focal neurological deficits. Extremities: Symmetric 5 x 5 power. Skin: No rashes, lesions or ulcers Psychiatry: Judgement and insight appear normal. Mood & affect appropriate.   Data Reviewed: I have personally reviewed following labs and imaging studies  CBC: Recent Labs  Lab 01/25/21 2345 01/26/21 0001 01/26/21 0500 01/29/21 0100 01/30/21 0146 01/31/21 0246 02/01/21 0436  WBC 13.4*  --  14.2* 8.2 7.7 9.2 7.9  NEUTROABS 10.6*  --   --   --   --   --   --   HGB 13.7   < > 13.8 11.6* 12.5* 11.0* 10.3*  HCT 40.6   < > 41.7 35.0* 36.0* 33.8* 29.7*  MCV 95.3  --  94.6 93.3 92.3 93.9 92.8  PLT 195  --  209 170 207 189 208   < > = values in this interval not displayed.   Basic Metabolic Panel: Recent Labs  Lab 01/25/21 2345 01/26/21 0001 01/26/21 0500 01/28/21 0138 01/29/21 0100 01/30/21 0146 01/31/21 0246 02/01/21 0436  NA 133*   < > 133* 134* 133* 133* 135 133*  K 4.1    < > 4.0 4.1 3.5 3.5 3.3* 3.2*  CL 97*   < > 100 99 100 99 101 99  CO2 22  --  25 27 25 24 26 25   GLUCOSE 123*   < > 131* 129* 157* 119* 102* 118*  BUN 20   < > 17 23 26* 15 18 19   CREATININE 0.83   < > 0.82 0.79 0.67 0.70 0.68 0.68  CALCIUM 9.4  --  9.2 8.8* 8.6* 8.6* 8.4* 8.2*  MG 2.0  --  1.8 1.9  --   --   --   --   PHOS  --   --  2.4* 2.3*  --   --   --   --    < > = values in this interval not displayed.   GFR: Estimated Creatinine Clearance: 59.4 mL/min (by C-G formula based on SCr of 0.68 mg/dL). Liver Function Tests: Recent Labs  Lab 01/25/21 2345  AST 30  ALT 22  ALKPHOS 91  BILITOT 0.7  PROT 6.9  ALBUMIN 4.1   No results for input(s): LIPASE, AMYLASE in the last 168 hours. No results for input(s): AMMONIA in the last 168 hours. Coagulation Profile: Recent Labs  Lab 01/25/21 2345  INR 1.0   Cardiac Enzymes: No results for input(s): CKTOTAL, CKMB, CKMBINDEX, TROPONINI in the last 168 hours. BNP (last 3 results) No results for input(s): PROBNP in the last 8760 hours. HbA1C: No results for input(s): HGBA1C in the last 72 hours. CBG: Recent Labs  Lab 01/26/21 0653 01/26/21 0735 01/28/21 2124 01/29/21 0638  GLUCAP 121* 135* 211* 107*   Lipid Profile: No results for input(s): CHOL, HDL, LDLCALC, TRIG, CHOLHDL, LDLDIRECT in the last 72 hours. Thyroid Function Tests: No results for input(s): TSH, T4TOTAL, FREET4, T3FREE, THYROIDAB in the last 72 hours. Anemia Panel: No results for input(s): VITAMINB12, FOLATE, FERRITIN, TIBC, IRON, RETICCTPCT in the last 72 hours. Sepsis Labs: Recent Labs  Lab 01/25/21 2345  LATICACIDVEN 1.9    Recent Results (from the past 240 hour(s))  SARS Coronavirus 2 by RT PCR (hospital order, performed in [facility] hospital lab) Nasopharyngeal Nasopharyngeal Swab     Status: None   Collection Time: 01/26/21 12:47 AM   Specimen: Nasopharyngeal Swab  Result Value Ref Range Status   SARS Coronavirus 2 NEGATIVE NEGATIVE Final     Comment: (NOTE) SARS-CoV-2 target nucleic acids are NOT DETECTED.  The SARS-CoV-2 RNA is generally detectable in upper and lower respiratory specimens during the acute phase of infection. The lowest concentration of SARS-CoV-2 viral copies this assay can detect is 250 copies / mL. A negative result does not preclude SARS-CoV-2 infection and should not be used as the sole basis for treatment or other patient management decisions.  A negative result may occur with improper specimen collection / handling, submission of specimen other than nasopharyngeal swab, presence of viral mutation(s) within the areas targeted by this assay, and inadequate number of viral copies (<250 copies / mL). A negative result must be combined with clinical observations, patient history, and epidemiological information.  Fact Sheet for Patients:   StrictlyIdeas.no  Fact Sheet for Healthcare Providers: BankingDealers.co.za  This test is not yet approved or  cleared by the [place] FDA and has been authorized for detection and/or diagnosis of SARS-CoV-2 by FDA under an Emergency Use Authorization (EUA).  This EUA will remain in effect (meaning this test can be used) for the duration of the COVID-19 declaration under Section 564(b)(1) of the Act, 21 U.S.C. section 360bbb-3(b)(1), unless the authorization is terminated or revoked sooner.  Performed at [facility], [place] [address], [place], [place] [address]   MRSA PCR Screening     Status: None   Collection Time: 01/26/21  6:41 AM   Specimen: Nasopharyngeal  Result Value Ref Range Status   MRSA by PCR NEGATIVE NEGATIVE Final    Comment:        The GeneXpert MRSA Assay (FDA approved for NASAL specimens only), is one component of a comprehensive MRSA colonization surveillance program. It is not intended to diagnose MRSA infection nor to guide or monitor treatment for MRSA infections. Performed at  [facility], [place] [address], [place],  [address]      Radiology Studies: No results found.  Scheduled Meds:  carvedilol  3.125 mg Oral BID WC   chlorhexidine  15 mL Mouth Rinse BID   Chlorhexidine Gluconate Cloth  6 each Topical Daily   cholecalciferol  2,000 Units Oral Daily   dorzolamide  1 drop Both Eyes TID   feeding supplement  237 mL Oral BID BM   latanoprost  1 drop Both Eyes QHS   levETIRAcetam  500 mg Oral BID   mouth rinse  15 mL Mouth Rinse q12n4p   pantoprazole  40 mg Oral Daily   tamsulosin  0.4 mg Oral Daily   Continuous Infusions:  lactated ringers 50 mL/hr at 02/01/21 0500     LOS: 6 days   Time spent: 32min  Phyllip Claw C Glendy Barsanti, DO [facility]  If 7PM-7AM, please contact night-coverage [email or web address]  02/01/2021, 7:55 AM

## 2021-02-01 NOTE — Progress Notes (Signed)
Physical Therapy Treatment Patient Details Name: Ryan Lynn MRN: [id number] DOB: Aug 30, 1934 Today's Date: 02/01/2021    History of Present Illness 87 yo admitted 1/26 after fall down stairs possibly with syncope. Pt with new Afib with RVR with small right SDH, SAH. PMhx: ICH in 2021    PT Comments    He had bouts of diarrhea throughout session and benefited from a do-it-yourself made brief today. Pt making progress with mobility, ambulating an increased distance of up to ~72 ft during 1 bout while utilizing a RW and minA for stability and safety/management of lines, leads, and RW. Focused session on transfer and gait training, with pt only needing minAx1 to come to stand today after being provided visual demonstration of proper sequencing of hands between surfaces. Pt is very motivated to improve, at risk for falls and injuries, below his baseline level of function, and able to tolerate increased therapy time and would be a great candidate to benefit from intensive therapy in the CIR setting. Will continue to follow acutely.   Follow Up Recommendations  CIR;Supervision/Assistance - 24 hour     Equipment Recommendations  Rolling walker with 5" wheels    Recommendations for Other Services       Precautions / Restrictions Precautions Precautions: Fall Precaution Comments: obturator for eating, HOH speak to right ear Restrictions Weight Bearing Restrictions: No    Mobility  Bed Mobility Overal bed mobility: Needs Assistance Bed Mobility: Sit to Supine       Sit to supine: Min assist   General bed mobility comments: Increased time and assistance at legs to complete ascension onto bed and laterally scooting them to get pt midline in bed. MinA for superior transition in bed, cuing pt to pull up on rails.  Transfers Overall transfer level: Needs assistance Equipment used: Rolling walker (2 wheeled) Transfers: Sit to/from [facility] Sit to Stand: Min assist Stand  pivot transfers: Min assist       General transfer comment: Visual demonstration with verbal cues for hands to push up on seated surface then once buttocks cleared to transition one hand at a time to RW, success. Extra time and minA to power up to stand and manage RW and cue to sequence steps to side step to L to commode.  Ambulation/Gait Ambulation/Gait assistance: Min assist Gait Distance (Feet): 72 Feet (x2 bouts of ~3 ft > ~72 ft) Assistive device: Rolling walker (2 wheeled) Gait Pattern/deviations: Decreased stride length;Step-through pattern;Trunk flexed;Shuffle;Decreased step length - right;Decreased step length - left Gait velocity: reduced Gait velocity interpretation: <1.31 ft/sec, indicative of household ambulator General Gait Details: flexed posture and decreased feet clearance and step length, cuing to correct with min success. Assistance to maintain balance, manage RW during turns, and manage lines/leads.   Stairs             Wheelchair Mobility    Modified Rankin (Stroke Patients Only) Modified Rankin (Stroke Patients Only) Pre-Morbid Rankin Score: No significant disability Modified Rankin: Moderately severe disability     Balance Overall balance assessment: Needs assistance Sitting-balance support: Feet supported;Bilateral upper extremity supported Sitting balance-Leahy Scale: Poor Sitting balance - Comments: UE support to lift leg to donn/doff pants.   Standing balance support: Bilateral upper extremity supported Standing balance-Leahy Scale: Poor Standing balance comment: RW for support with flexed posture                            Cognition Arousal/Alertness: Awake/alert Behavior  During Therapy: WFL for tasks assessed/performed Overall Cognitive Status: Impaired/Different from baseline Area of Impairment: Attention;Following commands;Safety/judgement;Problem solving;Memory                   Current Attention Level:  Sustained Memory: Decreased short-term memory Following Commands: Follows one step commands consistently;Follows one step commands with increased time;Follows multi-step commands with increased time Safety/Judgement: Decreased awareness of safety;Decreased awareness of deficits   Problem Solving: Slow processing;Requires verbal cues;Requires tactile cues;Difficulty sequencing General Comments: Pt requires repeated multi-modal cues for sequencing with transfers and mobility utilizing RW. Cues for maintaining safety and to manage lines/leads with assistance.      Exercises      General Comments General comments (skin integrity, edema, etc.): Bouts of diarrhea throughout, made "diaper" with paper pad held between legs with paper scrub pants      Pertinent Vitals/Pain Pain Assessment: 0-10 Pain Score: 2  Pain Location: buttocks with diarrhea Pain Descriptors / Indicators: Burning;Pressure Pain Intervention(s): Limited activity within patient's tolerance;Monitored during session;Repositioned    Home Living                      Prior Function            PT Goals (current goals can now be found in the care plan section) Acute Rehab PT Goals Patient Stated Goal: to get better and go home PT Goal Formulation: With patient/family Time For Goal Achievement: 02/10/21 Potential to Achieve Goals: Fair Progress towards PT goals: Progressing toward goals    Frequency    Min 3X/week      PT Plan Current plan remains appropriate    Co-evaluation              AM-PAC PT "6 Clicks" Mobility   Outcome Measure  Help needed turning from your back to your side while in a flat bed without using bedrails?: A Little Help needed moving from lying on your back to sitting on the side of a flat bed without using bedrails?: A Little Help needed moving to and from a bed to a chair (including a wheelchair)?: A Little Help needed standing up from a chair using your arms (e.g.,  wheelchair or bedside chair)?: A Little Help needed to walk in hospital room?: A Little Help needed climbing 3-5 steps with a railing? : A Lot 6 Click Score: 17    End of Session Equipment Utilized During Treatment: Gait belt Activity Tolerance: Patient tolerated treatment well Patient left: in bed;with call bell/phone within reach;with bed alarm set;with family/visitor present Nurse Communication: Mobility status;Other (comment) (diarrhea) PT Visit Diagnosis: Other abnormalities of gait and mobility (R26.89);Difficulty in walking, not elsewhere classified (R26.2);Unsteadiness on feet (R26.81);History of falling (Z91.81)     Time: 6644-0347 PT Time Calculation (min) (ACUTE ONLY): 50 min  Charges:  $Gait Training: 23-37 mins $Therapeutic Activity: 8-22 mins                     Moishe Spice, PT, DPT Acute Rehabilitation Services  Pager: [phone] Office: [place] 02/01/2021, 4:57 PM

## 2021-02-01 NOTE — Progress Notes (Signed)
Inpatient Rehab Admissions Coordinator:   I spoke with [facility] and pt.'s initial request for acute rehab has been denied, as their Market researcher felt pt. Was not stable due to "having a pending MBSS." He gave the option to resubmit the case once MBSS is complete. I plan to re-open a case once MBS has been completed.   Clemens Catholic, [place], [place] Admissions Coordinator  [phone] ([place]) [phone] (office)

## 2021-02-01 NOTE — Progress Notes (Signed)
Nutrition Follow-up  DOCUMENTATION CODES:   Severe malnutrition in context of chronic illness  INTERVENTION:   -d/c Magic cup   -Ensure Enlive TID, each supplement provides 350 kcal, 20 g protein  -Continue Vital Cuisine/Hormel Shake TID, each supplement provides 520 kcal, 22 grams protein   -Monitor po intake, pt would benefit from tube feeding to assist in meeting nutrition needs. Consider Cortrak placement if within pt's goals of care.   -Re-weight pt  NUTRITION DIAGNOSIS:   Severe Malnutrition related to chronic illness as evidenced by severe fat depletion,severe muscle depletion.  GOAL:   Patient will meet greater than or equal to 90% of their needs  MONITOR:   PO intake,Supplement acceptance,Skin,I & O's,Labs,Weight trends  REASON FOR ASSESSMENT:   Consult Assessment of nutrition requirement/status  ASSESSMENT:   87 yo male admitted S/P fall down stairs due to syncopal event, ICH and R SDH. PMH includes A fib, parotic cancer s/p XRT, GERD, glaucoma, aspiration PNA.  Pt mentioned that he has not had a good appetite PTA or during his current hospital stay.  Pt's wife told intern that pt is currently consuming about 25% of his tray. She mentioned that he is not able to eat the protein that he is being served due to the meat being too tough and not chopped properly for his tolerance. Pt's wife mentioned that pt is enjoying the mashed potatoes that he is being served and is able to consume these easily.   Pt's wife mentioned that at home pt is able to eat creamy foods such as mashed potatoes or rice/noodle based dishes.   Pt and his wife did indicated that he was enjoying the ONS he was being served. Pt did mention that the Magic Cup did cause some discomfort d/t its cold temperature. Pt agreed to continue with his supplements of Vital Cuisine Shake and Ensure Enlive. Intern discussed the benefits of these supplements in aiding his healing process due to the added protein  and calories.   Intern discussed with pt and his wife that a tube feeding would assist in ensuring the pt is receiving adequate nutrition. Pt's wife indicated that this would be a last resort and was something that they would discuss with pt's doctor. Intern encouraged po intake as well as continuation with his current ONS.   Pt discussed with intern that his UBW was around 150#. Pt and pt's wife did mention that he felt he has lost weight since his hospital admission. Pt's noted weights indicated a 14% weight loss between 01/26 and 01/27. However, this weight loss may not be an accurate measurement.   01/31 - speech therapy consult to assess swallowing d/t worsening wet upper airway cough (concern for not clearing well); speech consulted and recommended continuation with current plan of Dysphagia 3 (mechanical soft) and nectar-thick liquid.  02/02 - SLP recommended pt continue with Dysphagia 3 (mechanical soft) and nectar thick liquid.  Meds reviewed: vitamin D3 (2000 IU, daily), Ensure Enlive (BID)  Labs reviewed: sodium (133 mg/dL), potassium (3.2 mmol/L), glucose (118 mg/dL), calcium (8.2 mg/dL)  NUTRITION - FOCUSED PHYSICAL EXAM:  Flowsheet Row Most Recent Value  Orbital Region Severe depletion  Upper Arm Region Severe depletion  Thoracic and Lumbar Region Severe depletion  Buccal Region Severe depletion  Temple Region Moderate depletion  Clavicle Bone Region Severe depletion  Clavicle and Acromion Bone Region Severe depletion  Scapular Bone Region Severe depletion  Dorsal Hand Moderate depletion  Patellar Region Severe depletion  Anterior Thigh Region  Severe depletion  Posterior Calf Region Severe depletion  Edema (RD Assessment) None  Hair Reviewed  Eyes Reviewed  Mouth Reviewed  Skin Reviewed  Nails Reviewed       Diet Order:   Diet Order            DIET DYS 3 Room service appropriate? Yes with Assist; Fluid consistency: Nectar Thick  Diet effective now                  EDUCATION NEEDS:   No education needs have been identified at this time  Skin:  Skin Assessment: Skin Integrity Issues: Skin Integrity Issues:: Incisions Incisions: open laceration;Skin tear R arm; closed incision R side of head Other: skin tear R arm; incision R side of head  Last BM:  01/31 (type 7)  Height:   Ht Readings from Last 1 Encounters:  01/25/21 6' (1.829 m)    Weight:   Wt Readings from Last 1 Encounters:  01/26/21 61.1 kg    Ideal Body Weight:  80.9 kg  BMI:  Body mass index is 18.27 kg/m.  Estimated Nutritional Needs:   Kcal:  2100-2300  Protein:  105-120 g  Fluid:  >/= 2.1 L    Salvadore Oxford, Dietetic Intern 02/01/2021 2:50 PM

## 2021-02-01 NOTE — Progress Notes (Signed)
OT Cancellation Note  Patient Details Name: COSTA JHA MRN: [id number] DOB: Jul 25, 1934   Cancelled Treatment:    Reason Eval/Treat Not Completed: Other (comment) patient with PT at this time. OT to check back as time allows.   Gloris Manchester OTR/L Supplemental OT, Department of rehab services [phone]  Bradyn Soward R H. 02/01/2021, 2:02 PM

## 2021-02-01 NOTE — Progress Notes (Addendum)
  Speech Language Pathology Treatment: Dysphagia  Patient Details Name: Ryan Lynn MRN: [id number] DOB: 10-Nov-1934 Today's Date: 02/01/2021 Time: 1013-1040 SLP Time Calculation (min) (ACUTE ONLY): 27 min  Assessment / Plan / Recommendation Clinical Impression  Pt sitting in recliner, feeding himself with assistance from his wife.  He is much more alert and participatory today, yet demonstrates overt clinical signs of dysphagia with concerns for aspiration. There is constant wet coughing associated with all PO intake.  Lung sounds with bilateral rhonchi.  He may benefit from repeating an MBS to ascertain current function and to gather information for decision-making.  There may be diet modifications that can be made to bridge this period of time while he has had acute decompensation.  Pt and his wife agree with plan.  Mr. Arnall has requested [name] perform the study as she worked with him in Dec 2020 when he had his last evaluation and he was impressed with her care. Will plan to schedule for next date - Continue current diet cautiously.  Pt is making his own decisions about his care and prefers to remain on dys3/nectars pending MBS.   HPI HPI: Pt is an 87 y.o. 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 He presented to [facility] ED 1/26 after he fell at home down a flight of stairs.  CT head 1/27: Acute intracranial hemorrhage with small right parietal subdural hematoma demonstrating mild mass effect upon the right cerebral hemisphere. CXR 1/26: No acute infiltrate or edema. 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 Pt's wife reported that the had swallow studies in the past and provided a sheet with instructions which he has given. Per the wife, pt has vocal fold atrophy and must use chin tuck, effortful swallow, and drink from a straw. Pill sizes that are larger than the size of an aspirin are crushed at home. Liquids  must be thickened to "mild to moderate" thickness and he consumed "soft and moist" foods. Pt's wife stated noodles, potatoes, pasta, etc, but stated that the pt cannot control soup. Pt's wife showed SLP the amount of thickener that she typically uses and it appears that he is on nectar thick liquids. CT cervical spine on admission revealed severe degenerative disc disease, particularly at C4-6. Pt's most recent swallow study was 09/25/19, at which time deteriorating swallow was revealed with intermittent aspiration and signficant pharyngeal residue.  Pt participated in OP SLP for dysphagia tx October-December of 2020 (these records are available under a different MRN).      SLP Plan  Continue with current plan of care       Recommendations  Diet recommendations: Dysphagia 3 (mechanical soft);Nectar-thick liquid Liquids provided via: Cup;Straw Medication Administration: Crushed with puree Supervision: Patient able to self feed;Staff to assist with self feeding Compensations: Slow rate;Small sips/bites;Chin tuck;Effortful swallow Postural Changes and/or Swallow Maneuvers: Out of bed for meals                Oral Care Recommendations: Oral care BID Follow up Recommendations: Inpatient Rehab SLP Visit Diagnosis: Dysphagia, pharyngeal phase (R13.13) Plan: Continue with current plan of care       GO              Rhian Funari L. Tivis Ringer, [place] CCC/SLP Acute Rehabilitation Services Office number [phone] Pager [phone]   [name] 02/01/2021, 11:05 AM

## 2021-02-02 ENCOUNTER — Inpatient Hospital Stay (HOSPITAL_COMMUNITY): Payer: Medicare Other

## 2021-02-02 DIAGNOSIS — E43 Unspecified severe protein-calorie malnutrition: Secondary | ICD-10-CM | POA: Insufficient documentation

## 2021-02-02 DIAGNOSIS — R55 Syncope and collapse: Secondary | ICD-10-CM | POA: Diagnosis not present

## 2021-02-02 LAB — CBC
HCT: 32.9 % — ABNORMAL LOW (ref 39.0–52.0)
Hemoglobin: 10.6 g/dL — ABNORMAL LOW (ref 13.0–17.0)
MCH: 30.3 pg (ref 26.0–34.0)
MCHC: 32.2 g/dL (ref 30.0–36.0)
MCV: 94 fL (ref 80.0–100.0)
Platelets: 247 10*3/uL (ref 150–400)
RBC: 3.5 MIL/uL — ABNORMAL LOW (ref 4.22–5.81)
RDW: 12.9 % (ref 11.5–15.5)
WBC: 6.8 10*3/uL (ref 4.0–10.5)
nRBC: 0 % (ref 0.0–0.2)

## 2021-02-02 LAB — BASIC METABOLIC PANEL
Anion gap: 8 (ref 5–15)
BUN: 14 mg/dL (ref 8–23)
CO2: 25 mmol/L (ref 22–32)
Calcium: 8.1 mg/dL — ABNORMAL LOW (ref 8.9–10.3)
Chloride: 99 mmol/L (ref 98–111)
Creatinine, Ser: 0.65 mg/dL (ref 0.61–1.24)
GFR, Estimated: >60 mL/min (ref >60)
Glucose, Bld: 116 mg/dL — ABNORMAL HIGH (ref 70–99)
Potassium: 3.9 mmol/L (ref 3.5–5.1)
Sodium: 132 mmol/L — ABNORMAL LOW (ref 135–145)

## 2021-02-02 NOTE — Progress Notes (Signed)
Inpatient Rehabilitation-Admissions Coordinator   Was notified by Horris Latino with SLP that the [facility] has been completed. Will now re-open insurance case for CIR request. Will follow up once there has been a determination.   Raechel Ache, OTR/L  Rehab Admissions Coordinator  [phone] 02/02/2021 11:14 AM

## 2021-02-02 NOTE — Progress Notes (Addendum)
Physical Therapy Treatment Patient Details Name: Ryan Lynn MRN: [id number] DOB: 01-23-1934 Today's Date: 02/02/2021    History of Present Illness 87 yo admitted 1/26 after fall down stairs possibly with syncope. Pt with new Afib with RVR with small right SDH, SAH. PMhx: ICH in 2021    PT Comments    Pt fatigued after a long day and requested to only do exercises in bed to focus on strengthening his legs for functional mobility purposes rather than get out of bed today. Thus, pt performed the Exercises listed below with noted decreased quality and thus strength in his R leg compared to his L. Noted particular weakness in his R gluteus medius, quads, and hip flexors compared to his L, which would impact his stability and leg advancement with gait. Pt is at high risk for falls and injury and very motivated to participate and improve and would therefore greatly benefit from intensive therapy services in the CIR setting to maximize his independence and safety with all functional mobility to ultimately maximize his quality of life. Will continue to follow acutely.    Follow Up Recommendations  CIR;Supervision/Assistance - 24 hour     Equipment Recommendations  Rolling walker with 5" wheels    Recommendations for Other Services       Precautions / Restrictions Precautions Precautions: Fall Precaution Comments: obturator for eating, HOH speak to right ear Restrictions Weight Bearing Restrictions: No    Mobility  Bed Mobility Overal bed mobility: Needs Assistance Bed Mobility: Rolling Rolling: Min guard         General bed mobility comments: Min guard for pt to roll to either side 1x with use of rails. MinA to transition superiorly in bed with cues to push through feet.  Transfers Overall transfer level: Needs assistance Equipment used: Rolling walker (2 wheeled) Transfers: Sit to/from [facility] Sit to Stand: Min assist Stand pivot transfers: Min assist        General transfer comment: Pt declined out of bed interventions today due to fatigue  Ambulation/Gait             General Gait Details: Pt declined out of bed interventions today due to fatigue   Stairs             Wheelchair Mobility    Modified Rankin (Stroke Patients Only) Modified Rankin (Stroke Patients Only) Pre-Morbid Rankin Score: No significant disability Modified Rankin: Moderately severe disability     Balance Overall balance assessment: Needs assistance Sitting-balance support: Feet supported;Bilateral upper extremity supported Sitting balance-Leahy Scale: Poor Sitting balance - Comments: UE support to lift leg to donn/doff pants.   Standing balance support: Single extremity supported;Bilateral upper extremity supported Standing balance-Leahy Scale: Poor Standing balance comment: Flexed posture. Min A to steady at sink with unilateral UE support on sink surface.                            Cognition Arousal/Alertness: Awake/alert Behavior During Therapy: WFL for tasks assessed/performed Overall Cognitive Status: Impaired/Different from baseline Area of Impairment: Attention;Following commands;Safety/judgement;Problem solving;Memory                   Current Attention Level: Sustained Memory: Decreased short-term memory Following Commands: Follows one step commands consistently;Follows one step commands with increased time;Follows multi-step commands with increased time Safety/Judgement: Decreased awareness of safety;Decreased awareness of deficits   Problem Solving: Slow processing;Requires verbal cues;Requires tactile cues;Difficulty sequencing General Comments: Repeated multi-modal cues  throughout.      Exercises General Exercises - Lower Extremity Ankle Circles/Pumps: Strengthening;Both;15 reps;Supine Gluteal Sets: Strengthening;Both;10 reps;Supine (bridges in bed) Short Arc Quad: Strengthening;Both;10 reps;Supine Hip  ABduction/ADduction: Strengthening;Both;10 reps;Sidelying;Supine (supine hip adduction against manual resistance; sidelying clamshells) Straight Leg Raises: Strengthening;Both;10 reps;Supine    General Comments General comments (skin integrity, edema, etc.): SpO2 as low as 80s% with exercise and coughing moving in bed; SpO2 increased to 90s% when resting end of session o1-1.5L/min O2 via [place] throughout      Pertinent Vitals/Pain Pain Assessment: No/denies pain Pain Intervention(s): Monitored during session    Home Living                      Prior Function            PT Goals (current goals can now be found in the care plan section) Acute Rehab PT Goals Patient Stated Goal: to get better and go home PT Goal Formulation: With patient/family Time For Goal Achievement: 02/10/21 Potential to Achieve Goals: Fair Progress towards PT goals: Progressing toward goals    Frequency    Min 4X/week      PT Plan Current plan remains appropriate;Frequency needs to be updated    Co-evaluation              AM-PAC PT "6 Clicks" Mobility   Outcome Measure  Help needed turning from your back to your side while in a flat bed without using bedrails?: A Little Help needed moving from lying on your back to sitting on the side of a flat bed without using bedrails?: A Little Help needed moving to and from a bed to a chair (including a wheelchair)?: A Little Help needed standing up from a chair using your arms (e.g., wheelchair or bedside chair)?: A Little Help needed to walk in hospital room?: A Little Help needed climbing 3-5 steps with a railing? : A Lot 6 Click Score: 17    End of Session   Activity Tolerance: Patient limited by fatigue Patient left: in bed;with call bell/phone within reach;with bed alarm set;with nursing/sitter in room Nurse Communication: Mobility status;Other (comment) (SpO2 changes) PT Visit Diagnosis: Other abnormalities of gait and mobility  (R26.89);Difficulty in walking, not elsewhere classified (R26.2);Unsteadiness on feet (R26.81);History of falling (Z91.81)     Time: 2595-6387 PT Time Calculation (min) (ACUTE ONLY): 26 min  Charges:  $Therapeutic Exercise: 23-37 mins                     Moishe Spice, PT, DPT Acute Rehabilitation Services  Pager: [phone] Office: [place] 02/02/2021, 5:48 PM

## 2021-02-02 NOTE — Progress Notes (Signed)
Pt placed on 3L O2 last nigth 2/2 @ 2300. The patient would desat in the lower 80's while sleeping. Awake he would hang out in the low 90's. Pt was never above 91 throughout the night, unless he was talking or coughing. Pt was not in any distress. Pt temperature would fluctuate, temperature adjusted in the room and some covers removed.

## 2021-02-02 NOTE — Progress Notes (Signed)
Has been off and on oxygen throughout the day because keeps desatting.  It is mostly when he falls asleep.  He did better when up in the chair and talking and engaging with his wife.  Does cough and sounds like there is something in the back of his throat but when you go to suction him, there is nothing there.  He did try the honey thick liquid diet but could not tolerate it because it was too thick and he and wife asked that I call ST, Horris Latino, to inform her and see if something else can be done or if he can go back to nectar thick liquid.  Nectar thick was reordered.

## 2021-02-02 NOTE — Progress Notes (Signed)
Peer-to-peer evaluation  Unfortunately patient's insurance company has denied patient's application for inpatient rehab placement and requested a peer to peer by noon tomorrow (02/03/2021).  I called the number that was requested of me, unfortunately they are unable to locate any denials or issues with the patient's current hospital stay or requests.  At this point patient remains medically stable for discharge to inpatient rehab facility, defer to insurance company for further evaluation of patient's status.  I strongly feel the patient would do well at inpatient rehab, certainly the goal of the patient and wife is to return home and be as independent as possible and inpatient rehab would be the best course of action to ensure this outcome.   I remain available if insurance needs further information but at this time given the number provided [phone] opt 5) I was unable to assist the patient I have no further records to appeal patient's denial.

## 2021-02-02 NOTE — Progress Notes (Signed)
Occupational Therapy Treatment Patient Details Name: Ryan Lynn MRN: [id number] DOB: 07/13/1934 Today's Date: 02/02/2021    History of present illness 87 yo admitted 1/26 after fall down stairs possibly with syncope. Pt with new Afib with RVR with small right SDH, SAH. PMhx: ICH in 2021   OT comments  OT treatment session with focus on activity tolerance, standing balance, and self-care re-education. Patient able to stand at sink for completion of 3/3 grooming tasks with Min A. Repeat reminders for upright posture as patient has forward head with anterior bias. Patient continues to be limited by decreased strength, decreased cognition, decreased sitting/standing balance and need for Min to Mod A overall for self-care tasks. Continued recommendation for intensive CIR. OT will continue to follow acutely.    Follow Up Recommendations  CIR;Supervision/Assistance - 24 hour    Equipment Recommendations  3 in 1 bedside commode;Other (comment) (RW)    Recommendations for Other Services      Precautions / Restrictions Precautions Precautions: Fall Precaution Comments: obturator for eating, HOH speak to right ear Restrictions Weight Bearing Restrictions: No       Mobility Bed Mobility Overal bed mobility: Needs Assistance             General bed mobility comments: Patient seated in recliner upon entry.  Transfers Overall transfer level: Needs assistance Equipment used: Rolling walker (2 wheeled) Transfers: Sit to/from [facility] Sit to Stand: Min assist Stand pivot transfers: Min assist       General transfer comment: Sit to stand from recliner at sink in prep for grooming with Min A and cues for hand placement. Good knowledge of technique for controlled descent when sitting.    Balance Overall balance assessment: Needs assistance Sitting-balance support: Feet supported;Bilateral upper extremity supported Sitting balance-Leahy Scale: Poor Sitting balance -  Comments: UE support to lift leg to donn/doff pants.   Standing balance support: Single extremity supported;Bilateral upper extremity supported Standing balance-Leahy Scale: Poor Standing balance comment: Flexed posture. Min A to steady at sink with unilateral UE support on sink surface.                           ADL either performed or assessed with clinical judgement   ADL       Grooming: Minimal assistance;Standing                                 General ADL Comments: Oral hygiene standing at sink level with Min A for steadying. Patient able to manage tube of toothpaste with assist to place toothpaste on toothbrush only 2/2 decreased pinch strength.     Vision       Perception     Praxis      Cognition Arousal/Alertness: Awake/alert Behavior During Therapy: WFL for tasks assessed/performed Overall Cognitive Status: Impaired/Different from baseline Area of Impairment: Attention;Following commands;Safety/judgement;Problem solving;Memory                   Current Attention Level: Sustained Memory: Decreased short-term memory Following Commands: Follows one step commands consistently;Follows one step commands with increased time;Follows multi-step commands with increased time Safety/Judgement: Decreased awareness of safety;Decreased awareness of deficits   Problem Solving: Slow processing;Requires verbal cues;Requires tactile cues;Difficulty sequencing General Comments: Able to sequence oral hygiene standing at sink level with occasional cueing.        Exercises     Shoulder  Instructions       General Comments      Pertinent Vitals/ Pain       Pain Assessment: No/denies pain  Home Living                                          Prior Functioning/Environment              Frequency  Min 2X/week        Progress Toward Goals  OT Goals(current goals can now be found in the care plan section)  Progress  towards OT goals: Progressing toward goals  Acute Rehab OT Goals Patient Stated Goal: to get better and go home OT Goal Formulation: With patient/family Time For Goal Achievement: 02/10/21 Potential to Achieve Goals: Good ADL Goals Pt Will Perform Eating: with modified independence;sitting Pt Will Perform Grooming: with modified independence;standing Pt Will Perform Upper Body Bathing: with modified independence;sitting Pt Will Perform Lower Body Bathing: with modified independence;sit to/from stand Pt Will Transfer to Toilet: with modified independence;ambulating Additional ADL Goal #1: Pt will demonstrae anticipatory awareness during ADL tasks  Plan Discharge plan remains appropriate;Frequency remains appropriate    Co-evaluation                 AM-PAC OT "6 Clicks" Daily Activity     Outcome Measure   Help from another person eating meals?: A Lot Help from another person taking care of personal grooming?: A Little Help from another person toileting, which includes using toliet, bedpan, or urinal?: A Lot Help from another person bathing (including washing, rinsing, drying)?: A Lot Help from another person to put on and taking off regular upper body clothing?: A Little Help from another person to put on and taking off regular lower body clothing?: A Lot 6 Click Score: 14    End of Session Equipment Utilized During Treatment: Gait belt  OT Visit Diagnosis: Unsteadiness on feet (R26.81);Other abnormalities of gait and mobility (R26.89);Muscle weakness (generalized) (M62.81);History of falling (Z91.81);Other symptoms and signs involving cognitive function;Pain;Dizziness and giddiness (R42)   Activity Tolerance Patient tolerated treatment well   Patient Left in chair;with call bell/phone within reach;with chair alarm set;with family/visitor present   Nurse Communication          Time: 7616-0737 OT Time Calculation (min): 24 min  Charges: OT General Charges $OT  Visit: 1 Visit OT Treatments $Self Care/Home Management : 23-37 mins  Vanette Noguchi H. OTR/L Supplemental OT, Department of rehab services [phone]   Samiel Peel R H. 02/02/2021, 3:25 PM

## 2021-02-02 NOTE — Plan of Care (Signed)

## 2021-02-02 NOTE — Progress Notes (Signed)
Inpatient Rehabilitation-Admissions Coordinator   Pt's insurance company has requested a peer to peer conference prior to making a final determination regarding CIR request. AC has notified attending on peer to peer deadline of noon EST on 02/03/21. Will follow up once there has been a final determination.   Raechel Ache, OTR/L  Rehab Admissions Coordinator  [phone] 02/02/2021 5:03 PM

## 2021-02-02 NOTE — Progress Notes (Signed)
Modified Barium Swallow Progress Note  Patient Details  Name: Ryan Lynn MRN: [id number] Date of Birth: [birth year]  Today's Date: 02/02/2021  Modified Barium Swallow completed.  Full report located under Chart Review in the Imaging Section.  Brief recommendations include the following:  Clinical Impression  In comparison with MBS in 2020, severity of pts dysphagia has become even more profoundly impaired, likely with ongoing chronic decline and with new acute impairment given increased weakness and functional reserve after a fall. Pt demonstrates severe oral impairment. He cannot hold his head up straight and anterior tilt exacerbates reduced labial seal and decreased ability to lift up tongue to hard palate for lingual striping and bolus propulsion. He could not achieve negative oral pressure for a cup or straw sip and required spoon feeding which is not his baseline. Bolus transit was more of posterior spillage and required a larger bolus or pt could not even get the bolus to the vallecule. There was absent velopharyngeal elevation, and minimal hyoide excursion and epiglottic deflection. Though pts pharynx is distended due to curvature of his spine he did manage to achieve some pharyngeal paeristalsis to transit 10-15% of boluses through UES during multiple swallows. THis improved with a head turn left. Oropharyngeal residue was still profound and pt has decreased sensation of this. Following bites of puree with teaspoons of honey thick liquids did aid in pharyngeal transit though pt does aspirate the mixed residue post swallow with intermittent sensation and no significant airway clearance. Overall, the pts risk of an aspiration based pna is high, his risk of asphyxiation with food is high and his risk of poor nutritional intake is high.  See recommendations below. Given an acute component of dysphagia, pt would greatly benefit from rehabilitation as there are ways to increase cough strength, awareness  of oral hygiene and mobility to combat pts risk. Will f/u for discussion with family and MD regarding these topics.   Swallow Evaluation Recommendations       SLP Diet Recommendations: Honey thick liquids;Other (Comment) (moist solids from home, dys 3 texture at hospital is not recommended.) With known risk of aspiration if family wishes.    Liquid Administration via: Spoon   Medication Administration: Crushed with puree   Supervision: Patient able to self feed;Staff to assist with self feeding   Compensations: Slow rate;Small sips/bites;Multiple dry swallows after each bite/sip;Follow solids with liquid (head turn left)       Oral Care Recommendations: Oral care QID   Other Recommendations: Order thickener from [place], [place] Pager [phone] Office [phone]  Lynann Beaver 02/02/2021,10:48 AM

## 2021-02-02 NOTE — Progress Notes (Signed)
PROGRESS NOTE    Ryan Lynn  [id number] DOB: August 19, 1934 DOA: 01/25/2021 PCP: Shon Baton, MD   Brief Narrative:  87 yo male fell down stairs at home and brought to ER on 01/25/21.  Found to have [place], Syncope, SDH, A fib, Parotic cancer s/p XRT, GERD, ED, Glaucoma bilateral, Aspiration pneumonia complicated by C diff in 2016   CT head 1/26 > ICH with small right parietal SDH with mild mass effect, no MLS.  Mild SAH of medial right temporal lobe.  Echo 1/27 > EF 45%, grade 1 DD, RVSP 34.1 mmHg, aortic root 41 mm  Carotid duplex 1/27 > Right Carotid: Velocities in the right ICA are consistent with a 1-39% stenosis.  Left Carotid: Velocities in the left ICA are consistent with a 1-39% stenosis.  Vertebrals: Right vertebral artery demonstrates antegrade flow. Left vertebral artery was not visualized.   CT head 1/27 >> no change in Rt posterior SDH   Assessment & Plan:   Principal Problem:   Syncope and collapse Active Problems:   SAH (subarachnoid hemorrhage) (HCC)   SDH (subdural hematoma) (HCC)   Atrial fibrillation with rapid ventricular response (HCC)   Scalp laceration   Fall   Protein-calorie malnutrition, severe   Traumatic SDH and SAH secondary to syncopal event(see below). - Evaluated by neurosurgery at intake -  imaging studies stable -no further intervention or imaging unless otherwise specified by neurosurgery - 7 days of Keppra per neurosurgery recommendation - tentatively to stop February 02, 2021 - - Would likely benefit from outpt follow up with neurology (previously seen by Dr. Antony Contras)  Syncopal event with new onset systolic CHF, POA PAC, PVCs. Aortic root dilation. Reported hx of PAF. Hypertension. -Unclear etiology, continue to follow clinically  -Continue carvedilol  -Frequent PVCs noted on telemetry, consistent with his baseline -Cardiology previously following given new reduced EF at 45% -no further recommendations, signed off as of January 28, 2021  Acute on chronic dysphagia in setting of prior hx of parotic cancer s/p XRT. Severe protein caloric malnutrition and cachexia. - Speech therapy continues to follow -appreciate insight and recommendations - Continue flutter, incentive spirometry: continues to have weak wet cough - OOB to chair for all meals - no more eating/drinking while suppine - High risk for aspiration given his history -barium swallow today shows as expected acute on chronic dysphagia, hopefully will continue to improve with ongoing speech therapy. -Increasing p.o. intake appropriately, encouraged increased free water, otherwise will liberalize diet allow family to bring in home foods to encourage improved p.o. intake.  Glaucoma. - continue eye drops  -Dorzolamide 3 times daily, latanoprost nightly   Urinary outlet obstruction, unspecified - Bladder scan overnight showed large residual volume - I/O catheter offered overnight but patient refused - Add flomax - follow UOP/symptoms  Deconditioning, chronic -Continue PT OT  DVT prophylaxis: None in the setting of above, early ambulation, SCDs only Code Status: DNR - change in status and confirmed at bedside on 02/02/2021 with wife present. Family Communication: Lengthy discussion this morning with wife at bedside about CODE STATUS, disposition and likely prolonged clinical course of physical therapy and speech therapy given his somewhat weakened and deteriorated state after being in the hospital for the past week.  Status is: Inpatient  Dispo: The patient is from: Home              Anticipated d/c is to: CIR cannot offer a bed until next week.  Anticipated d/c date is: 24-48h pending above              Patient currently IS medically stable for discharge  Consultants:   Neurosurgery, PCCM, Cardiology  Procedures:   None  Antimicrobials:  None   Subjective: No acute issues or events overnight, patient continues to have episodes of mild  hypoxia overnight likely undiagnosed sleep apnea, will continue to support with oxygen.  Otherwise patient continues to be excited and motivated for inpatient rehab evaluation, denies chest pain, shortness of breath, nausea, vomiting, diarrhea, constipation, headache, fevers, chills.  He does complain of ongoing cough but markedly improving from previous.  Objective: Vitals:   02/01/21 2337 02/02/21 0442 02/02/21 0500 02/02/21 0700  BP: 139/75 (!) 146/81  136/73  Pulse: 95 99  95  Resp: 20 19  (!) 34  Temp: 98.8 F (37.1 C) (!) 100.8 F (38.2 C)  98.4 F (36.9 C)  TempSrc: Oral Oral  Axillary  SpO2: 94% (!) 89% 91% 91%  Weight:   68.7 kg   Height:        Intake/Output Summary (Last 24 hours) at 02/02/2021 0756 Last data filed at 02/01/2021 1927 Gross per 24 hour  Intake --  Output 250 ml  Net -250 ml   Filed Weights   01/25/21 2340 01/26/21 0640 02/02/21 0500  Weight: 69.4 kg 61.1 kg 68.7 kg    Examination:  General exam: Appears calm and comfortable  Respiratory system: Clear to auscultation. Respiratory effort normal.  Notable upper airway noises due to chronic dysphagia Cardiovascular system: S1 & S2 heard, RRR. No JVD, murmurs, rubs, gallops or clicks. No pedal edema. Gastrointestinal system: Abdomen is nondistended, soft and nontender. No organomegaly or masses felt. Normal bowel sounds heard. Central nervous system: Alert and oriented. No focal neurological deficits. Extremities: Symmetric 5 x 5 power. Skin: No rashes, lesions or ulcers Psychiatry: Judgement and insight appear normal. Mood & affect appropriate.   Data Reviewed: I have personally reviewed following labs and imaging studies  CBC: Recent Labs  Lab 01/29/21 0100 01/30/21 0146 01/31/21 0246 02/01/21 0436 02/02/21 0402  WBC 8.2 7.7 9.2 7.9 6.8  HGB 11.6* 12.5* 11.0* 10.3* 10.6*  HCT 35.0* 36.0* 33.8* 29.7* 32.9*  MCV 93.3 92.3 93.9 92.8 94.0  PLT 170 207 189 208 102   Basic Metabolic  Panel: Recent Labs  Lab 01/28/21 0138 01/29/21 0100 01/30/21 0146 01/31/21 0246 02/01/21 0436 02/02/21 0402  NA 134* 133* 133* 135 133* 132*  K 4.1 3.5 3.5 3.3* 3.2* 3.9  CL 99 100 99 101 99 99  CO2 27 25 24 26 25 25   GLUCOSE 129* 157* 119* 102* 118* 116*  BUN 23 26* 15 18 19 14   CREATININE 0.79 0.67 0.70 0.68 0.68 0.65  CALCIUM 8.8* 8.6* 8.6* 8.4* 8.2* 8.1*  MG 1.9  --   --   --   --   --   PHOS 2.3*  --   --   --   --   --    GFR: Estimated Creatinine Clearance: 66.8 mL/min (by C-G formula based on SCr of 0.65 mg/dL). Liver Function Tests: No results for input(s): AST, ALT, ALKPHOS, BILITOT, PROT, ALBUMIN in the last 168 hours. No results for input(s): LIPASE, AMYLASE in the last 168 hours. No results for input(s): AMMONIA in the last 168 hours. Coagulation Profile: No results for input(s): INR, PROTIME in the last 168 hours. Cardiac Enzymes: No results for input(s): CKTOTAL, CKMB, CKMBINDEX, TROPONINI in the last  168 hours. BNP (last 3 results) No results for input(s): PROBNP in the last 8760 hours. HbA1C: No results for input(s): HGBA1C in the last 72 hours. CBG: Recent Labs  Lab 01/28/21 2124 01/29/21 0638  GLUCAP 211* 107*   Lipid Profile: No results for input(s): CHOL, HDL, LDLCALC, TRIG, CHOLHDL, LDLDIRECT in the last 72 hours. Thyroid Function Tests: No results for input(s): TSH, T4TOTAL, FREET4, T3FREE, THYROIDAB in the last 72 hours. Anemia Panel: No results for input(s): VITAMINB12, FOLATE, FERRITIN, TIBC, IRON, RETICCTPCT in the last 72 hours. Sepsis Labs: No results for input(s): PROCALCITON, LATICACIDVEN in the last 168 hours.  Recent Results (from the past 240 hour(s))  SARS Coronavirus 2 by RT PCR (hospital order, performed in [facility] hospital lab) Nasopharyngeal Nasopharyngeal Swab     Status: None   Collection Time: 01/26/21 12:47 AM   Specimen: Nasopharyngeal Swab  Result Value Ref Range Status   SARS Coronavirus 2 NEGATIVE NEGATIVE Final     Comment: (NOTE) SARS-CoV-2 target nucleic acids are NOT DETECTED.  The SARS-CoV-2 RNA is generally detectable in upper and lower respiratory specimens during the acute phase of infection. The lowest concentration of SARS-CoV-2 viral copies this assay can detect is 250 copies / mL. A negative result does not preclude SARS-CoV-2 infection and should not be used as the sole basis for treatment or other patient management decisions.  A negative result may occur with improper specimen collection / handling, submission of specimen other than nasopharyngeal swab, presence of viral mutation(s) within the areas targeted by this assay, and inadequate number of viral copies (<250 copies / mL). A negative result must be combined with clinical observations, patient history, and epidemiological information.  Fact Sheet for Patients:   StrictlyIdeas.no  Fact Sheet for Healthcare Providers: BankingDealers.co.za  This test is not yet approved or  cleared by the [place] FDA and has been authorized for detection and/or diagnosis of SARS-CoV-2 by FDA under an Emergency Use Authorization (EUA).  This EUA will remain in effect (meaning this test can be used) for the duration of the COVID-19 declaration under Section 564(b)(1) of the Act, 21 U.S.C. section 360bbb-3(b)(1), unless the authorization is terminated or revoked sooner.  Performed at [facility], [place] [address], [place], [place] [address]   MRSA PCR Screening     Status: None   Collection Time: 01/26/21  6:41 AM   Specimen: Nasopharyngeal  Result Value Ref Range Status   MRSA by PCR NEGATIVE NEGATIVE Final    Comment:        The GeneXpert MRSA Assay (FDA approved for NASAL specimens only), is one component of a comprehensive MRSA colonization surveillance program. It is not intended to diagnose MRSA infection nor to guide or monitor treatment for MRSA  infections. Performed at [facility], [place] [address], [place], [place] [address]      Radiology Studies: No results found.  Scheduled Meds:  carvedilol  3.125 mg Oral BID WC   chlorhexidine  15 mL Mouth Rinse BID   Chlorhexidine Gluconate Cloth  6 each Topical Daily   cholecalciferol  2,000 Units Oral Daily   dorzolamide  1 drop Both Eyes TID   feeding supplement  237 mL Oral TID BM   latanoprost  1 drop Both Eyes QHS   levETIRAcetam  500 mg Oral BID   mouth rinse  15 mL Mouth Rinse q12n4p   pantoprazole  40 mg Oral Daily   tamsulosin  0.4 mg Oral Daily  Continuous Infusions:  lactated ringers 50 mL/hr at 02/02/21 0012     LOS: 7 days   Time spent: 95min  Kimiye Strathman C Borghild Thaker, DO [facility]  If 7PM-7AM, please contact night-coverage [email or web address]  02/02/2021, 7:56 AM

## 2021-02-02 NOTE — Progress Notes (Signed)
SLP Cancellation Note  Patient Details Name: DEO MEHRINGER MRN: [id number] DOB: 12/02/1934   Cancelled treatment:        Given that pt want nectar thick instead of honey and risk is present with either texture, will change back to nectar per his preference.    Deicy Rusk, Katherene Ponto 02/02/2021, 2:32 PM

## 2021-02-02 NOTE — Progress Notes (Signed)
Speech Language Pathology Treatment: Dysphagia  Patient Details Name: Ryan Lynn MRN: [id number] DOB: September 06, 1934 Today's Date: 02/02/2021 Time: 4585-9292 SLP Time Calculation (min) (ACUTE ONLY): 75 min  Assessment / Plan / Recommendation Clinical Impression  Visited pt and wife at bedside to review results. Wife confirms that pt has been consuming soft solids with extra gravy and sauce at home and drinking thickened liquids. He has declined over the past few months and on review of his imaging was also found to have had an MRI in 9/21 that showed Hyperdensity left anterior pons on CT corresponds to an area of chronic micro hemorrhage which may explain the dramatic decline in swallowing recently. During this session I assisted pt with oral care as dry oral mucosa is impeding function. Pt noted to have severe coughing when attempting oral care, which wife reports is new. I encouraged pt to brush teeth and care for obturator here as he would at home and request care from staff. We also discussed strategies to bring pts food from home as the mech soft diet is too dry for him to attempt. We talked about severe risk of aspiration with all PO and even immediate choking on food. We broached the topic of alternative methods of nutrition to supplement his nutritional needs but further discussion is needed. Will f/u for further interventions and to resume exercises to target pulmonary hygiene and hyoid excursion as appropriate.    HPI HPI: Pt is an 87 y.o. 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 He presented to [facility] ED 1/26 after he fell at home down a flight of stairs.  CT head 1/27: Acute intracranial hemorrhage with small right parietal subdural hematoma demonstrating mild mass effect upon the right cerebral hemisphere. CXR 1/26: No acute infiltrate or edema. 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 Pt's wife reported that  the had swallow studies in the past and provided a sheet with instructions which he has given. Per the wife, pt has vocal fold atrophy and must use chin tuck, effortful swallow, and drink from a straw. Pill sizes that are larger than the size of an aspirin are crushed at home. Liquids must be thickened to "mild to moderate" thickness and he consumed "soft and moist" foods. Pt's wife stated noodles, potatoes, pasta, etc, but stated that the pt cannot control soup. Pt's wife showed SLP the amount of thickener that she typically uses and it appears that he is on nectar thick liquids. CT cervical spine on admission revealed severe degenerative disc disease, particularly at C4-6. Pt's most recent swallow study was 09/25/19, at which time deteriorating swallow was revealed with intermittent aspiration and signficant pharyngeal residue.  Pt participated in OP SLP for dysphagia tx October-December of 2020 (these records are available under a different MRN).      SLP Plan  Continue with current plan of care       Recommendations  Diet recommendations: Other(comment) (meals from home, honey thick liquids) Medication Administration: Crushed with puree Supervision: Staff to assist with self feeding Compensations: Slow rate;Small sips/bites;Multiple dry swallows after each bite/sip;Follow solids with liquid;Effortful swallow;Clear throat intermittently                Follow up Recommendations: Inpatient Rehab SLP Visit Diagnosis: Dysphagia, pharyngeal phase (R13.13) Plan: Continue with current plan of care       GO               [facility] [place],  MA CCC-SLP  Acute Rehabilitation Services Pager [phone] Office [phone]  Lynann Beaver 02/02/2021, 11:00 AM

## 2021-02-03 DIAGNOSIS — R55 Syncope and collapse: Secondary | ICD-10-CM | POA: Diagnosis not present

## 2021-02-03 NOTE — Progress Notes (Signed)
PROGRESS NOTE    Ryan Lynn  FIE:[id number] DOB: 28-Nov-1934 DOA: 01/25/2021 PCP: Shon Baton, MD   Brief Narrative:  87 yo male fell down stairs at home and brought to ER on 01/25/21.  Found to have [place], Syncope, SDH, A fib, Parotic cancer s/p XRT, GERD, ED, Glaucoma bilateral, Aspiration pneumonia complicated by C diff in 2016   CT head 1/26 > ICH with small right parietal SDH with mild mass effect, no MLS.  Mild SAH of medial right temporal lobe.  Echo 1/27 > EF 45%, grade 1 DD, RVSP 34.1 mmHg, aortic root 41 mm  Carotid duplex 1/27 > Right Carotid: Velocities in the right ICA are consistent with a 1-39% stenosis.  Left Carotid: Velocities in the left ICA are consistent with a 1-39% stenosis.  Vertebrals: Right vertebral artery demonstrates antegrade flow. Left vertebral artery was not visualized.   CT head 1/27 >> no change in Rt posterior SDH   Assessment & Plan:   Principal Problem:   Syncope and collapse Active Problems:   SAH (subarachnoid hemorrhage) (HCC)   SDH (subdural hematoma) (HCC)   Atrial fibrillation with rapid ventricular response (HCC)   Scalp laceration   Fall   Protein-calorie malnutrition, severe  Traumatic SDH and SAH secondary to syncopal event(see below). - Evaluated by neurosurgery at intake -  imaging studies stable -no further intervention or imaging unless otherwise specified by neurosurgery - 7 days of Keppra per neurosurgery recommendation - tentatively to stop February 02, 2021 - - Would likely benefit from outpt follow up with neurology (previously seen by Dr. Antony Contras)  Syncopal event with new onset systolic CHF, POA PAC, PVCs. Aortic root dilation. Reported hx of PAF. Hypertension. -Unclear etiology, continue to follow clinically  -Continue carvedilol  -Frequent PVCs noted on telemetry, consistent with his baseline -Cardiology previously following given new reduced EF at 45% -no further recommendations, signed off as of January 28, 2021  Acute on chronic dysphagia in setting of prior hx of parotic cancer s/p XRT. Severe protein caloric malnutrition and cachexia. - Speech therapy continues to follow - appreciate insight and recommendations - Continue flutter, incentive spirometry: continues to have weak wet cough - OOB to chair for all meals - no more eating/drinking while suppine - High risk for aspiration given his history -barium swallow today shows as expected acute on chronic dysphagia, hopefully will continue to improve with ongoing speech therapy. -Increasing p.o. intake appropriately, encouraged increased free water, otherwise will liberalize diet allow family to bring in home foods to encourage improved p.o. intake.  Glaucoma. - continue eye drops  -Dorzolamide 3 times daily, latanoprost nightly   Urinary outlet obstruction, unspecified - Bladder scan overnight showed large residual volume - I/O catheter offered overnight but patient refused - Add flomax - follow UOP/symptoms  Deconditioning, chronic -Continue PT OT  DVT prophylaxis: None in the setting of above, early ambulation, SCDs only Code Status: DNR - change in status and confirmed at bedside on 02/02/2021 with wife present. Family Communication: Lengthy discussion this morning with wife at bedside about patient's status, we continue to discuss his high risk for aspiration in the setting of dysphagia but will attempt to push patient to improve with physical therapy and speech therapy over the next few weeks at rehab once approved.  Status is: Inpatient  Dispo: The patient is from: Home              Anticipated d/c is to: CIR pending approval and bed availability  Anticipated d/c date is: 24-48h pending bed availability and insurance approval              Patient currently IS medically stable for discharge  Consultants:   Neurosurgery, PCCM, Cardiology  Procedures:   None  Antimicrobials:  None   Subjective: No acute issues  or events overnight, patient continues to have episodes of hypoxia overnight likely in the setting of sleep apnea, difficult to wean off oxygen today while patient has very weak cough and ongoing dysphagia there is concern for ongoing possible aspiration but remains without fevers or chills or other complaints other than cough..  Objective: Vitals:   02/02/21 1704 02/02/21 1946 02/02/21 2341 02/03/21 0501  BP:  139/86 119/64 132/73  Pulse:  78 91 94  Resp:  15 19 20   Temp:  98 F (36.7 C) 99 F (37.2 C) 98.3 F (36.8 C)  TempSrc:  Oral Oral Oral  SpO2: 93% 91% 94% 92%  Weight:      Height:        Intake/Output Summary (Last 24 hours) at 02/03/2021 0719 Last data filed at 02/02/2021 1555 Gross per 24 hour  Intake 138 ml  Output 400 ml  Net -262 ml   Filed Weights   01/25/21 2340 01/26/21 0640 02/02/21 0500  Weight: 69.4 kg 61.1 kg 68.7 kg    Examination:  General exam: Appears calm and comfortable  Respiratory system: Clear to auscultation. Respiratory effort normal.  Notable upper airway noises due to chronic dysphagia/anatomical changes. Cardiovascular system: S1 & S2 heard, RRR. No JVD, murmurs, rubs, gallops or clicks. No pedal edema. Gastrointestinal system: Abdomen is nondistended, soft and nontender. No organomegaly or masses felt. Normal bowel sounds heard. Central nervous system: Alert and oriented. No focal neurological deficits. Extremities: Symmetric 5 x 5 power. Skin: No rashes, lesions or ulcers Psychiatry: Judgement and insight appear normal. Mood & affect appropriate.   Data Reviewed: I have personally reviewed following labs and imaging studies  CBC: Recent Labs  Lab 01/29/21 0100 01/30/21 0146 01/31/21 0246 02/01/21 0436 02/02/21 0402  WBC 8.2 7.7 9.2 7.9 6.8  HGB 11.6* 12.5* 11.0* 10.3* 10.6*  HCT 35.0* 36.0* 33.8* 29.7* 32.9*  MCV 93.3 92.3 93.9 92.8 94.0  PLT 170 207 189 208 428   Basic Metabolic Panel: Recent Labs  Lab 01/28/21 0138  01/29/21 0100 01/30/21 0146 01/31/21 0246 02/01/21 0436 02/02/21 0402  NA 134* 133* 133* 135 133* 132*  K 4.1 3.5 3.5 3.3* 3.2* 3.9  CL 99 100 99 101 99 99  CO2 27 25 24 26 25 25   GLUCOSE 129* 157* 119* 102* 118* 116*  BUN 23 26* 15 18 19 14   CREATININE 0.79 0.67 0.70 0.68 0.68 0.65  CALCIUM 8.8* 8.6* 8.6* 8.4* 8.2* 8.1*  MG 1.9  --   --   --   --   --   PHOS 2.3*  --   --   --   --   --    GFR: Estimated Creatinine Clearance: 66.8 mL/min (by C-G formula based on SCr of 0.65 mg/dL). Liver Function Tests: No results for input(s): AST, ALT, ALKPHOS, BILITOT, PROT, ALBUMIN in the last 168 hours. No results for input(s): LIPASE, AMYLASE in the last 168 hours. No results for input(s): AMMONIA in the last 168 hours. Coagulation Profile: No results for input(s): INR, PROTIME in the last 168 hours. Cardiac Enzymes: No results for input(s): CKTOTAL, CKMB, CKMBINDEX, TROPONINI in the last 168 hours. BNP (last 3 results) No  results for input(s): PROBNP in the last 8760 hours. HbA1C: No results for input(s): HGBA1C in the last 72 hours. CBG: Recent Labs  Lab 01/28/21 2124 01/29/21 0638  GLUCAP 211* 107*   Lipid Profile: No results for input(s): CHOL, HDL, LDLCALC, TRIG, CHOLHDL, LDLDIRECT in the last 72 hours. Thyroid Function Tests: No results for input(s): TSH, T4TOTAL, FREET4, T3FREE, THYROIDAB in the last 72 hours. Anemia Panel: No results for input(s): VITAMINB12, FOLATE, FERRITIN, TIBC, IRON, RETICCTPCT in the last 72 hours. Sepsis Labs: No results for input(s): PROCALCITON, LATICACIDVEN in the last 168 hours.  Recent Results (from the past 240 hour(s))  SARS Coronavirus 2 by RT PCR (hospital order, performed in [facility] hospital lab) Nasopharyngeal Nasopharyngeal Swab     Status: None   Collection Time: 01/26/21 12:47 AM   Specimen: Nasopharyngeal Swab  Result Value Ref Range Status   SARS Coronavirus 2 NEGATIVE NEGATIVE Final    Comment: (NOTE) SARS-CoV-2 target  nucleic acids are NOT DETECTED.  The SARS-CoV-2 RNA is generally detectable in upper and lower respiratory specimens during the acute phase of infection. The lowest concentration of SARS-CoV-2 viral copies this assay can detect is 250 copies / mL. A negative result does not preclude SARS-CoV-2 infection and should not be used as the sole basis for treatment or other patient management decisions.  A negative result may occur with improper specimen collection / handling, submission of specimen other than nasopharyngeal swab, presence of viral mutation(s) within the areas targeted by this assay, and inadequate number of viral copies (<250 copies / mL). A negative result must be combined with clinical observations, patient history, and epidemiological information.  Fact Sheet for Patients:   StrictlyIdeas.no  Fact Sheet for Healthcare Providers: BankingDealers.co.za  This test is not yet approved or  cleared by the [place] FDA and has been authorized for detection and/or diagnosis of SARS-CoV-2 by FDA under an Emergency Use Authorization (EUA).  This EUA will remain in effect (meaning this test can be used) for the duration of the COVID-19 declaration under Section 564(b)(1) of the Act, 21 U.S.C. section 360bbb-3(b)(1), unless the authorization is terminated or revoked sooner.  Performed at [facility], [place] [address], [place], [place] [address]   MRSA PCR Screening     Status: None   Collection Time: 01/26/21  6:41 AM   Specimen: Nasopharyngeal  Result Value Ref Range Status   MRSA by PCR NEGATIVE NEGATIVE Final    Comment:        The GeneXpert MRSA Assay (FDA approved for NASAL specimens only), is one component of a comprehensive MRSA colonization surveillance program. It is not intended to diagnose MRSA infection nor to guide or monitor treatment for MRSA infections. Performed at [facility], [place]  [address] [place], [place] [address]      Radiology Studies: DG Swallowing Func-Speech Pathology  Result Date: 02/02/2021 Objective Swallowing Evaluation: Type of Study: MBS-Modified Barium Swallow Study  Patient Details Name: KENGO BOYAJIAN MRN: [id number] Date of Birth: 13-Nov-1934 Today's Date: 02/02/2021 Time: SLP Start Time (ACUTE ONLY): 0830 -SLP Stop Time (ACUTE ONLY): 0910 SLP Time Calculation (min) (ACUTE ONLY): 40 min Past Medical History: No past medical history on file. Past Surgical History: No past surgical history on file. HPI: Pt is an 87 y.o. 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 He presented to [facility] ED 1/26 after he fell at home down a flight of stairs.  CT head 1/27: Acute intracranial hemorrhage with small right parietal subdural hematoma demonstrating mild mass effect upon the right cerebral hemisphere. CXR 1/26: No acute infiltrate or edema. 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 Pt's wife reported that the had swallow studies in the past and provided a sheet with instructions which he has given. Per the wife, pt has vocal fold atrophy and must use chin tuck, effortful swallow, and drink from a straw. Pill sizes that are larger than the size of an aspirin are crushed at home. Liquids must be thickened to "mild to moderate" thickness and he consumed "soft and moist" foods. Pt's wife stated noodles, potatoes, pasta, etc, but stated that the pt cannot control soup. Pt's wife showed SLP the amount of thickener that she typically uses and it appears that he is on nectar thick liquids. CT cervical spine on admission revealed severe degenerative disc disease, particularly at C4-6. Pt's most recent swallow study was 09/25/19, at which time deteriorating swallow was revealed with intermittent aspiration and signficant pharyngeal residue.  Pt participated in OP SLP for dysphagia tx October-December of 2020 (these records  are available under a different MRN).  No data recorded Assessment / Plan / Recommendation CHL IP CLINICAL IMPRESSIONS 02/02/2021 Clinical Impression In comparison with MBS in 2020, severity of pts dysphagia has become even more profoundly impaired, likely with ongoing chronic decline and with new acute impairment given increased weakness and functional reserve after a fall. Pt demonstrates severe oral impairment. He cannot hold his head up straight and anterior tilt exacerbates reduced labial seal and decreased ability to lift up tongue to hard palate for lingual striping and bolus propulsion. He could not achieve negative oral pressure for a cup or straw sip and required spoon feeding which is not his baseline. Bolus transit was more of posterior spillage and required a larger bolus or pt could not even get the bolus to the vallecule. There was absent velopharyngeal elevation, and minimal hyoid excursion and epiglottic deflection. Though pts pharynx is distended due to curvature of his spine he did manage to achieve some pharyngeal peristalsis to transit 10-15% of boluses through UES during multiple swallows. THis improved with a head turn left. Oropharyngeal residue was still profound and pt has decreased sensation of this. Following bites of puree with teaspoons of honey thick liquids did aid in pharyngeal transit though pt does aspirate the mixed residue post swallow with intermittent sensation and no significant airway clearance. Overall, the pts risk of an aspiration based pna is high, his risk of asphyxiation with food is high and his risk of poor nutritional intake is high.  See recommendations below. Given an acute component of dysphagia, pt would greatly benefit from rehabilitation as there are ways to increase cough strength, awareness of oral hygiene and mobility to combat pts risk. Will f/u for discussion with family and MD regarding these topics. SLP Visit Diagnosis Dysphagia, pharyngeal phase (R13.13)  Attention and concentration deficit following -- Frontal lobe and executive function deficit following -- Impact on safety and function Severe aspiration risk;Risk for inadequate nutrition/hydration   CHL IP TREATMENT RECOMMENDATION 02/02/2021 Treatment Recommendations Therapy as outlined in treatment plan below   Prognosis 01/27/2021 Prognosis for Safe Diet Advancement Guarded Barriers to Reach Goals Severity of deficits;Time post onset Barriers/Prognosis Comment -- CHL IP DIET RECOMMENDATION 02/02/2021 SLP Diet Recommendations Honey thick liquids;Other (Comment) Liquid Administration via Spoon Medication Administration Crushed with puree Compensations Slow rate;Small sips/bites;Multiple dry swallows after each bite/sip;Follow solids with liquid  Postural Changes --   CHL IP OTHER RECOMMENDATIONS 02/02/2021 Recommended Consults -- Oral Care Recommendations Oral care QID Other Recommendations Order thickener from pharmacy   CHL IP FOLLOW UP RECOMMENDATIONS 02/02/2021 Follow up Recommendations Inpatient Rehab   CHL IP FREQUENCY AND DURATION 02/02/2021 Speech Therapy Frequency (ACUTE ONLY) min 2x/week Treatment Duration 2 weeks      CHL IP ORAL PHASE 02/02/2021 Oral Phase Impaired Oral - Pudding Teaspoon -- Oral - Pudding Cup -- Oral - Honey Teaspoon Decreased bolus cohesion;Delayed oral transit;Lingual/palatal residue;Reduced posterior propulsion;Incomplete tongue to palate contact;Weak lingual manipulation;Left anterior bolus loss Oral - Honey Cup -- Oral - Nectar Teaspoon Decreased bolus cohesion;Delayed oral transit;Lingual/palatal residue;Reduced posterior propulsion;Incomplete tongue to palate contact;Weak lingual manipulation;Left anterior bolus loss Oral - Nectar Cup Reduced posterior propulsion;Decreased bolus cohesion Oral - Nectar Straw (No Data) Oral - Thin Teaspoon -- Oral - Thin Cup -- Oral - Thin Straw -- Oral - Puree Decreased bolus cohesion;Delayed oral transit;Lingual/palatal residue;Reduced posterior  propulsion;Incomplete tongue to palate contact;Weak lingual manipulation;Left anterior bolus loss Oral - Mech Soft -- Oral - Regular -- Oral - Multi-Consistency -- Oral - Pill -- Oral Phase - Comment --  CHL IP PHARYNGEAL PHASE 02/02/2021 Pharyngeal Phase Impaired Pharyngeal- Pudding Teaspoon -- Pharyngeal -- Pharyngeal- Pudding Cup -- Pharyngeal -- Pharyngeal- Honey Teaspoon Reduced pharyngeal peristalsis;Reduced epiglottic inversion;Reduced anterior laryngeal mobility;Reduced tongue base retraction;Penetration/Apiration after swallow;Moderate aspiration;Pharyngeal residue - valleculae;Pharyngeal residue - pyriform Pharyngeal Material enters airway, passes BELOW cords and not ejected out despite cough attempt by patient;Material enters airway, CONTACTS cords and not ejected out;Material enters airway, passes BELOW cords without attempt by patient to eject out (silent aspiration);Material does not enter airway Pharyngeal- Honey Cup -- Pharyngeal -- Pharyngeal- Nectar Teaspoon Reduced pharyngeal peristalsis;Reduced epiglottic inversion;Reduced anterior laryngeal mobility;Reduced tongue base retraction;Penetration/Apiration after swallow;Moderate aspiration;Pharyngeal residue - valleculae;Pharyngeal residue - pyriform Pharyngeal Material enters airway, passes BELOW cords without attempt by patient to eject out (silent aspiration);Material enters airway, passes BELOW cords and not ejected out despite cough attempt by patient;Material enters airway, CONTACTS cords and not ejected out;Material does not enter airway Pharyngeal- Nectar Cup -- Pharyngeal -- Pharyngeal- Nectar Straw -- Pharyngeal -- Pharyngeal- Thin Teaspoon -- Pharyngeal -- Pharyngeal- Thin Cup -- Pharyngeal -- Pharyngeal- Thin Straw -- Pharyngeal -- Pharyngeal- Puree Reduced pharyngeal peristalsis;Reduced epiglottic inversion;Reduced anterior laryngeal mobility;Reduced tongue base retraction;Pharyngeal residue - valleculae;Pharyngeal residue - pyriform  Pharyngeal -- Pharyngeal- Mechanical Soft -- Pharyngeal -- Pharyngeal- Regular -- Pharyngeal -- Pharyngeal- Multi-consistency -- Pharyngeal -- Pharyngeal- Pill -- Pharyngeal -- Pharyngeal Comment --  No flowsheet data found. [name], MA CCC-SLP Acute Rehabilitation Services Pager [phone] Office [phone] Lynann Beaver 02/02/2021, 10:49 AM               Scheduled Meds: . carvedilol  3.125 mg Oral BID WC  . chlorhexidine  15 mL Mouth Rinse BID  . Chlorhexidine Gluconate Cloth  6 each Topical Daily  . cholecalciferol  2,000 Units Oral Daily  . dorzolamide  1 drop Both Eyes TID  . feeding supplement  237 mL Oral TID BM  . latanoprost  1 drop Both Eyes QHS  . mouth rinse  15 mL Mouth Rinse q12n4p  . pantoprazole  40 mg Oral Daily  . tamsulosin  0.4 mg Oral Daily   Continuous Infusions:    LOS: 8 days   Time spent: 13min  Aziah Kaiser C Jenniah Bhavsar, DO [facility]  If 7PM-7AM, please contact night-coverage [email or web address]  02/03/2021, 7:19 AM

## 2021-02-03 NOTE — Progress Notes (Signed)
Physical Therapy Treatment Patient Details Name: Ryan Lynn MRN: [id number] DOB: [birth year] Today's Date: 02/03/2021    History of Present Illness 87 yo admitted 1/26 after fall down stairs possibly with syncope. Pt with new Afib with RVR with small right SDH, SAH. PMhx: ICH in 2021    PT Comments    Pt continues to make progress with mobility as he was able to ambulate up to ~180 ft with a RW and minA in one bout this date. However, he is at risk for falls and injuries as he tends to drag his R foot more compared to his L and could easily trip on objects/carpets and almost ran into an obstacle in the hall as he was not looking superiorly despite cues. Pt fatigued after gait bout, displaying endurance deficits. Will continue to follow acutely. Pt would greatly benefit from intensive therapies in CIR setting to maximize his independence and safety with all functional mobility to improve his quality of life and decrease his burden of care.   Follow Up Recommendations  CIR;Supervision/Assistance - 24 hour     Equipment Recommendations  Rolling walker with 5" wheels    Recommendations for Other Services       Precautions / Restrictions Precautions Precautions: Fall Precaution Comments: obturator for eating, HOH speak to right ear Restrictions Weight Bearing Restrictions: No    Mobility  Bed Mobility Overal bed mobility: Needs Assistance Bed Mobility: Sit to Supine       Sit to supine: Min assist   General bed mobility comments: Increased time and assistance at legs to complete superior transition onto bed to transition to supine. MinA for superior transition in bed, cuing pt to pull up on rails and push through feet.  Transfers Overall transfer level: Needs assistance Equipment used: Rolling walker (2 wheeled) Transfers: Sit to/from Stand Sit to Stand: Min assist         General transfer comment: Initially, pt tried to pull up on RW thus cued pt to push up from recliner  instead, success with minA and extra time to power up to stand and gain balance.  Ambulation/Gait Ambulation/Gait assistance: Min assist Gait Distance (Feet): 180 Feet Assistive device: Rolling walker (2 wheeled) Gait Pattern/deviations: Decreased stride length;Step-through pattern;Trunk flexed;Shuffle;Decreased step length - right;Decreased dorsiflexion - right Gait velocity: reduced Gait velocity interpretation: <1.31 ft/sec, indicative of household ambulator General Gait Details: flexed posture and decreased feet clearance and step length, especially on the R Provided tactile and verbal cues at R leg to correct, with good carryover noted. Pt came proximal to obstacle in hall and was cued to try to maintain a superior gaze rather than looking at feet to maintain safety.   Stairs             Wheelchair Mobility    Modified Rankin (Stroke Patients Only) Modified Rankin (Stroke Patients Only) Pre-Morbid Rankin Score: No significant disability Modified Rankin: Moderately severe disability     Balance Overall balance assessment: Needs assistance Sitting-balance support: Feet supported;Bilateral upper extremity supported Sitting balance-Leahy Scale: Poor Sitting balance - Comments: UE support with min guard for static sitting EOB.   Standing balance support: Bilateral upper extremity supported Standing balance-Leahy Scale: Poor Standing balance comment: RW for support with flexed posture                            Cognition Arousal/Alertness: Awake/alert Behavior During Therapy: WFL for tasks assessed/performed Overall Cognitive Status: Impaired/Different from baseline  Area of Impairment: Attention;Following commands;Safety/judgement;Problem solving;Memory                   Current Attention Level: Sustained Memory: Decreased short-term memory Following Commands: Follows one step commands consistently;Follows one step commands with increased time;Follows  multi-step commands with increased time Safety/Judgement: Decreased awareness of safety;Decreased awareness of deficits   Problem Solving: Slow processing;Requires verbal cues;Requires tactile cues;Difficulty sequencing General Comments: Pt requires repeated multi-modal cues for sequencing with transfers and mobility utilizing RW. Cues for maintaining safety.      Exercises      General Comments General comments (skin integrity, edema, etc.): On 4L/min O2 via [place] upon arrival, weaned to 2L/min with gait with SpO2 95%, once back in bed SpO2 92% and pt about to take nap thus increased back to 4L/min      Pertinent Vitals/Pain Pain Assessment: No/denies pain Pain Intervention(s): Monitored during session    Home Living                      Prior Function            PT Goals (current goals can now be found in the care plan section) Acute Rehab PT Goals Patient Stated Goal: to get better and go home PT Goal Formulation: With patient/family Time For Goal Achievement: 02/10/21 Potential to Achieve Goals: Fair Progress towards PT goals: Progressing toward goals    Frequency    Min 3X/week      PT Plan Current plan remains appropriate    Co-evaluation              AM-PAC PT "6 Clicks" Mobility   Outcome Measure  Help needed turning from your back to your side while in a flat bed without using bedrails?: A Little Help needed moving from lying on your back to sitting on the side of a flat bed without using bedrails?: A Little Help needed moving to and from a bed to a chair (including a wheelchair)?: A Little Help needed standing up from a chair using your arms (e.g., wheelchair or bedside chair)?: A Little Help needed to walk in hospital room?: A Little Help needed climbing 3-5 steps with a railing? : A Lot 6 Click Score: 17    End of Session Equipment Utilized During Treatment: Gait belt Activity Tolerance: Patient tolerated treatment well Patient left:  in bed;with call bell/phone within reach;with bed alarm set;with family/visitor present Nurse Communication: Mobility status PT Visit Diagnosis: Other abnormalities of gait and mobility (R26.89);Difficulty in walking, not elsewhere classified (R26.2);Unsteadiness on feet (R26.81);History of falling (Z91.81)     Time: 9562-1308 PT Time Calculation (min) (ACUTE ONLY): 23 min  Charges:  $Gait Training: 23-37 mins                     Moishe Spice, PT, DPT Acute Rehabilitation Services  Pager: [phone] Office: [place] 02/03/2021, 3:28 PM

## 2021-02-03 NOTE — Progress Notes (Addendum)
RRT called due to pt having a persistent cough. During evaluation, pt is noted to have a very weak but productive cough. His lungs bilaterally sound clear throughout. Upper airway above vocalcords noted to have thick, tan and copious secretions. Using a yaunkeur, RT is able to remove some but not all. Would recommend a PRN order for NT suction to clear pt's airway out for comfort. Pt is stable at this time. RN to page MD.  Update: MD gave verbal order for PRN NT suction. Pt NT suctioned, copious amounts of tan, thick secretions removed. RRT will continue to monitor.

## 2021-02-03 NOTE — Plan of Care (Signed)

## 2021-02-03 NOTE — Progress Notes (Signed)
Inpatient Rehab Admissions Coordinator:  Awaiting decision from insurance company regarding peer-to-peer.  Also, there are no beds available for potential CIR admission today.  Pt and family made aware.  Will continue to follow.   Gayland Curry, [place], [place] Admissions Coordinator [phone]

## 2021-02-04 DIAGNOSIS — R55 Syncope and collapse: Secondary | ICD-10-CM | POA: Diagnosis not present

## 2021-02-04 LAB — CBC
HCT: 32.5 % — ABNORMAL LOW (ref 39.0–52.0)
Hemoglobin: 10.9 g/dL — ABNORMAL LOW (ref 13.0–17.0)
MCH: 31.3 pg (ref 26.0–34.0)
MCHC: 33.5 g/dL (ref 30.0–36.0)
MCV: 93.4 fL (ref 80.0–100.0)
Platelets: 318 10*3/uL (ref 150–400)
RBC: 3.48 MIL/uL — ABNORMAL LOW (ref 4.22–5.81)
RDW: 12.9 % (ref 11.5–15.5)
WBC: 11.5 10*3/uL — ABNORMAL HIGH (ref 4.0–10.5)
nRBC: 0 % (ref 0.0–0.2)

## 2021-02-04 LAB — COMPREHENSIVE METABOLIC PANEL
ALT: 42 U/L (ref 0–44)
AST: 45 U/L — ABNORMAL HIGH (ref 15–41)
Albumin: 1.9 g/dL — ABNORMAL LOW (ref 3.5–5.0)
Alkaline Phosphatase: 75 U/L (ref 38–126)
Anion gap: 10 (ref 5–15)
BUN: 16 mg/dL (ref 8–23)
CO2: 25 mmol/L (ref 22–32)
Calcium: 8.2 mg/dL — ABNORMAL LOW (ref 8.9–10.3)
Chloride: 98 mmol/L (ref 98–111)
Creatinine, Ser: 0.7 mg/dL (ref 0.61–1.24)
GFR, Estimated: >60 mL/min (ref >60)
Glucose, Bld: 122 mg/dL — ABNORMAL HIGH (ref 70–99)
Potassium: 4.3 mmol/L (ref 3.5–5.1)
Sodium: 133 mmol/L — ABNORMAL LOW (ref 135–145)
Total Bilirubin: 1 mg/dL (ref 0.3–1.2)
Total Protein: 5 g/dL — ABNORMAL LOW (ref 6.5–8.1)

## 2021-02-04 NOTE — Progress Notes (Signed)
SLP Cancellation Note  Patient Details Name: Ryan Lynn MRN: [id number] DOB: April 24, 1934   Cancelled treatment:       Reason Eval/Treat Not Completed: Patient at procedure or test/unavailable; Pt unavailable (taking a bath) when SLP attempted.  Will continue efforts as able.  Elvina Sidle, M.S., [place] 02/04/2021, 3:21 PM

## 2021-02-04 NOTE — Progress Notes (Signed)
PROGRESS NOTE    Ryan Lynn  [id number] DOB: April 21, 1934 DOA: 01/25/2021 PCP: Shon Baton, MD   Brief Narrative:  87 yo male fell down stairs at home and brought to ER on 01/25/21.  Found to have [place], Syncope, SDH, A fib, Parotic cancer s/p XRT, GERD, ED, Glaucoma bilateral, Aspiration pneumonia complicated by C diff in 2016   CT head 1/26 > ICH with small right parietal SDH with mild mass effect, no MLS.  Mild SAH of medial right temporal lobe.  Echo 1/27 > EF 45%, grade 1 DD, RVSP 34.1 mmHg, aortic root 41 mm  Carotid duplex 1/27 > Right Carotid: Velocities in the right ICA are consistent with a 1-39% stenosis.  Left Carotid: Velocities in the left ICA are consistent with a 1-39% stenosis.  Vertebrals: Right vertebral artery demonstrates antegrade flow. Left vertebral artery was not visualized.   CT head 1/27 >> no change in Rt posterior SDH   Assessment & Plan:   Principal Problem:   Syncope and collapse Active Problems:   SAH (subarachnoid hemorrhage) (HCC)   SDH (subdural hematoma) (HCC)   Atrial fibrillation with rapid ventricular response (HCC)   Scalp laceration   Fall   Protein-calorie malnutrition, severe  Traumatic SDH and SAH secondary to syncopal event (see below). - Evaluated by neurosurgery at intake -  imaging studies stable -no further intervention or imaging unless otherwise specified by neurosurgery - 7 days of Keppra per neurosurgery recommendation -stopped 02/02/21 - Would likely benefit from outpt follow up with neurology (previously seen by Dr. Antony Contras)  Syncopal event with new onset systolic CHF, POA PAC, PVCs. Aortic root dilation. Reported hx of PAF. Hypertension. -Unclear etiology, continue to follow clinically  -Continue carvedilol  -Frequent PVCs noted on telemetry, consistent with his baseline -Cardiology previously following given new reduced EF at 45% -no further recommendations, signed off as of January 28, 2021  Acute on chronic  dysphagia in setting of prior hx of parotic cancer s/p XRT. Severe protein caloric malnutrition and cachexia. - Speech therapy continues to follow - appreciate insight and recommendations - Continue flutter, incentive spirometry: continues to have weak wet cough - OOB to chair for all meals - no more eating/drinking while suppine - High risk for aspiration given his history -barium swallow today shows as expected acute on chronic dysphagia, hopefully will continue to improve with ongoing speech therapy. -Increasing p.o. intake appropriately, encouraged increased free water, otherwise will liberalize diet allow family to bring in home foods to encourage improved p.o. intake.  Glaucoma. - continue eye drops  -Dorzolamide 3 times daily, latanoprost nightly   Urinary outlet obstruction, unspecified - Patient finally agreeable to foley catheter - will likely need to follow up with previous urologist once discharged - Add flomax - follow UOP/symptoms  Deconditioning, chronic -Continue PT OT  DVT prophylaxis: None in the setting of above, early ambulation, SCDs only Code Status: DNR - change in status and confirmed at bedside on 02/02/2021 with wife present. Family Communication: Lengthy discussion this morning with wife at bedside about patient's status, we continue to discuss his high risk for aspiration in the setting of dysphagia but will attempt to push patient to improve with physical therapy and speech therapy over the next few weeks at rehab once approved.  Status is: Inpatient  Dispo: The patient is from: Home              Anticipated d/c is to: CIR pending approval and bed availability  Anticipated d/c date is: 24-48h pending bed availability and insurance approval              Patient currently IS medically stable for discharge  Consultants:   Neurosurgery, PCCM, Cardiology  Procedures:   None  Antimicrobials:  None   Subjective: No acute issues or events  overnight, remains on oxygen today - very weak cough and ongoing dysphagia there is concern for ongoing possible aspiration but remains without fevers or chills or other complaints other than cough.  Objective: Vitals:   02/03/21 1538 02/03/21 2120 02/03/21 2321 02/04/21 0414  BP: 113/75 127/80 131/83 119/75  Pulse: 93 (!) 48 96 100  Resp: 18 19 17 17   Temp: 98.5 F (36.9 C) 98.3 F (36.8 C)  98 F (36.7 C)  TempSrc: Oral Oral    SpO2: 92% 91% 91% 94%  Weight:      Height:        Intake/Output Summary (Last 24 hours) at 02/04/2021 0807 Last data filed at 02/03/2021 1700 Gross per 24 hour  Intake --  Output 600 ml  Net -600 ml   Filed Weights   01/25/21 2340 01/26/21 0640 02/02/21 0500  Weight: 69.4 kg 61.1 kg 68.7 kg    Examination:  General: Cachectic appearing gentleman sitting up at bedside chair comfortably, No acute distress. HEENT:  Normocephalic atraumatic.  Sclerae nonicteric, noninjected.  Extraocular movements intact bilaterally. Neck: Postsurgical/radiation changes noted, upper airway noises/gurgling Lungs: Upper airway noises noted but lungs appear clear without rhonchi, wheeze, or rales. Heart:  Regular rate and rhythm.  Without murmurs, rubs, or gallops. Abdomen:  Soft, nontender, nondistended.  Without guarding or rebound. Extremities: Without cyanosis, clubbing, edema, or obvious deformity. Vascular:  Dorsalis pedis and posterior tibial pulses palpable bilaterally. Skin:  Warm and dry, no erythema, no ulcerations.  Data Reviewed: I have personally reviewed following labs and imaging studies  CBC: Recent Labs  Lab 01/30/21 0146 01/31/21 0246 02/01/21 0436 02/02/21 0402 02/04/21 0302  WBC 7.7 9.2 7.9 6.8 11.5*  HGB 12.5* 11.0* 10.3* 10.6* 10.9*  HCT 36.0* 33.8* 29.7* 32.9* 32.5*  MCV 92.3 93.9 92.8 94.0 93.4  PLT 207 189 208 247 0000000   Basic Metabolic Panel: Recent Labs  Lab 01/30/21 0146 01/31/21 0246 02/01/21 0436 02/02/21 0402 02/04/21 0302   NA 133* 135 133* 132* 133*  K 3.5 3.3* 3.2* 3.9 4.3  CL 99 101 99 99 98  CO2 24 26 25 25 25   GLUCOSE 119* 102* 118* 116* 122*  BUN 15 18 19 14 16   CREATININE 0.70 0.68 0.68 0.65 0.70  CALCIUM 8.6* 8.4* 8.2* 8.1* 8.2*   GFR: Estimated Creatinine Clearance: 66.8 mL/min (by C-G formula based on SCr of 0.7 mg/dL). Liver Function Tests: Recent Labs  Lab 02/04/21 0302  AST 45*  ALT 42  ALKPHOS 75  BILITOT 1.0  PROT 5.0*  ALBUMIN 1.9*   No results for input(s): LIPASE, AMYLASE in the last 168 hours. No results for input(s): AMMONIA in the last 168 hours. Coagulation Profile: No results for input(s): INR, PROTIME in the last 168 hours. Cardiac Enzymes: No results for input(s): CKTOTAL, CKMB, CKMBINDEX, TROPONINI in the last 168 hours. BNP (last 3 results) No results for input(s): PROBNP in the last 8760 hours. HbA1C: No results for input(s): HGBA1C in the last 72 hours. CBG: Recent Labs  Lab 01/28/21 2124 01/29/21 0638  GLUCAP 211* 107*   Lipid Profile: No results for input(s): CHOL, HDL, LDLCALC, TRIG, CHOLHDL, LDLDIRECT in the last  72 hours. Thyroid Function Tests: No results for input(s): TSH, T4TOTAL, FREET4, T3FREE, THYROIDAB in the last 72 hours. Anemia Panel: No results for input(s): VITAMINB12, FOLATE, FERRITIN, TIBC, IRON, RETICCTPCT in the last 72 hours. Sepsis Labs: No results for input(s): PROCALCITON, LATICACIDVEN in the last 168 hours.  Recent Results (from the past 240 hour(s))  SARS Coronavirus 2 by RT PCR (hospital order, performed in [facility] hospital lab) Nasopharyngeal Nasopharyngeal Swab     Status: None   Collection Time: 01/26/21 12:47 AM   Specimen: Nasopharyngeal Swab  Result Value Ref Range Status   SARS Coronavirus 2 NEGATIVE NEGATIVE Final    Comment: (NOTE) SARS-CoV-2 target nucleic acids are NOT DETECTED.  The SARS-CoV-2 RNA is generally detectable in upper and lower respiratory specimens during the acute phase of infection. The  lowest concentration of SARS-CoV-2 viral copies this assay can detect is 250 copies / mL. A negative result does not preclude SARS-CoV-2 infection and should not be used as the sole basis for treatment or other patient management decisions.  A negative result may occur with improper specimen collection / handling, submission of specimen other than nasopharyngeal swab, presence of viral mutation(s) within the areas targeted by this assay, and inadequate number of viral copies (<250 copies / mL). A negative result must be combined with clinical observations, patient history, and epidemiological information.  Fact Sheet for Patients:   StrictlyIdeas.no  Fact Sheet for Healthcare Providers: BankingDealers.co.za  This test is not yet approved or  cleared by the [place] FDA and has been authorized for detection and/or diagnosis of SARS-CoV-2 by FDA under an Emergency Use Authorization (EUA).  This EUA will remain in effect (meaning this test can be used) for the duration of the COVID-19 declaration under Section 564(b)(1) of the Act, 21 U.S.C. section 360bbb-3(b)(1), unless the authorization is terminated or revoked sooner.  Performed at [facility], [place] [address] [place], [place] [address]   MRSA PCR Screening     Status: None   Collection Time: 01/26/21  6:41 AM   Specimen: Nasopharyngeal  Result Value Ref Range Status   MRSA by PCR NEGATIVE NEGATIVE Final    Comment:        The GeneXpert MRSA Assay (FDA approved for NASAL specimens only), is one component of a comprehensive MRSA colonization surveillance program. It is not intended to diagnose MRSA infection nor to guide or monitor treatment for MRSA infections. Performed at [facility], [place] [address] [place] , [place] [address]      Radiology Studies: DG Swallowing Func-Speech Pathology  Result Date: 02/02/2021 Objective Swallowing Evaluation: Type of  Study: MBS-Modified Barium Swallow Study  Patient Details Name: DADE KRUPNICK MRN: [id number] Date of Birth: 1934/05/09 Today's Date: 02/02/2021 Time: SLP Start Time (ACUTE ONLY): 0830 -SLP Stop Time (ACUTE ONLY): 0910 SLP Time Calculation (min) (ACUTE ONLY): 40 min Past Medical History: No past medical history on file. Past Surgical History: No past surgical history on file. HPI: Pt is an 87 y.o. 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 He presented to [facility] ED 1/26 after he fell at home down a flight of stairs.  CT head 1/27: Acute intracranial hemorrhage with small right parietal subdural hematoma demonstrating mild mass effect upon the right cerebral hemisphere. CXR 1/26: No acute infiltrate or edema. 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 Pt's wife reported that the had swallow studies in the past and provided a  sheet with instructions which he has given. Per the wife, pt has vocal fold atrophy and must use chin tuck, effortful swallow, and drink from a straw. Pill sizes that are larger than the size of an aspirin are crushed at home. Liquids must be thickened to "mild to moderate" thickness and he consumed "soft and moist" foods. Pt's wife stated noodles, potatoes, pasta, etc, but stated that the pt cannot control soup. Pt's wife showed SLP the amount of thickener that she typically uses and it appears that he is on nectar thick liquids. CT cervical spine on admission revealed severe degenerative disc disease, particularly at C4-6. Pt's most recent swallow study was 09/25/19, at which time deteriorating swallow was revealed with intermittent aspiration and signficant pharyngeal residue.  Pt participated in OP SLP for dysphagia tx October-December of 2020 (these records are available under a different MRN).  No data recorded Assessment / Plan / Recommendation CHL IP CLINICAL IMPRESSIONS 02/02/2021 Clinical Impression In comparison  with MBS in 2020, severity of pts dysphagia has become even more profoundly impaired, likely with ongoing chronic decline and with new acute impairment given increased weakness and functional reserve after a fall. Pt demonstrates severe oral impairment. He cannot hold his head up straight and anterior tilt exacerbates reduced labial seal and decreased ability to lift up tongue to hard palate for lingual striping and bolus propulsion. He could not achieve negative oral pressure for a cup or straw sip and required spoon feeding which is not his baseline. Bolus transit was more of posterior spillage and required a larger bolus or pt could not even get the bolus to the vallecule. There was absent velopharyngeal elevation, and minimal hyoid excursion and epiglottic deflection. Though pts pharynx is distended due to curvature of his spine he did manage to achieve some pharyngeal peristalsis to transit 10-15% of boluses through UES during multiple swallows. THis improved with a head turn left. Oropharyngeal residue was still profound and pt has decreased sensation of this. Following bites of puree with teaspoons of honey thick liquids did aid in pharyngeal transit though pt does aspirate the mixed residue post swallow with intermittent sensation and no significant airway clearance. Overall, the pts risk of an aspiration based pna is high, his risk of asphyxiation with food is high and his risk of poor nutritional intake is high.  See recommendations below. Given an acute component of dysphagia, pt would greatly benefit from rehabilitation as there are ways to increase cough strength, awareness of oral hygiene and mobility to combat pts risk. Will f/u for discussion with family and MD regarding these topics. SLP Visit Diagnosis Dysphagia, pharyngeal phase (R13.13) Attention and concentration deficit following -- Frontal lobe and executive function deficit following -- Impact on safety and function Severe aspiration  risk;Risk for inadequate nutrition/hydration   CHL IP TREATMENT RECOMMENDATION 02/02/2021 Treatment Recommendations Therapy as outlined in treatment plan below   Prognosis 01/27/2021 Prognosis for Safe Diet Advancement Guarded Barriers to Reach Goals Severity of deficits;Time post onset Barriers/Prognosis Comment -- CHL IP DIET RECOMMENDATION 02/02/2021 SLP Diet Recommendations Honey thick liquids;Other (Comment) Liquid Administration via Spoon Medication Administration Crushed with puree Compensations Slow rate;Small sips/bites;Multiple dry swallows after each bite/sip;Follow solids with liquid Postural Changes --   CHL IP OTHER RECOMMENDATIONS 02/02/2021 Recommended Consults -- Oral Care Recommendations Oral care QID Other Recommendations Order thickener from pharmacy   CHL IP FOLLOW UP RECOMMENDATIONS 02/02/2021 Follow up Recommendations Inpatient Rehab   CHL IP FREQUENCY AND DURATION 02/02/2021 Speech Therapy Frequency (ACUTE  ONLY) min 2x/week Treatment Duration 2 weeks      CHL IP ORAL PHASE 02/02/2021 Oral Phase Impaired Oral - Pudding Teaspoon -- Oral - Pudding Cup -- Oral - Honey Teaspoon Decreased bolus cohesion;Delayed oral transit;Lingual/palatal residue;Reduced posterior propulsion;Incomplete tongue to palate contact;Weak lingual manipulation;Left anterior bolus loss Oral - Honey Cup -- Oral - Nectar Teaspoon Decreased bolus cohesion;Delayed oral transit;Lingual/palatal residue;Reduced posterior propulsion;Incomplete tongue to palate contact;Weak lingual manipulation;Left anterior bolus loss Oral - Nectar Cup Reduced posterior propulsion;Decreased bolus cohesion Oral - Nectar Straw (No Data) Oral - Thin Teaspoon -- Oral - Thin Cup -- Oral - Thin Straw -- Oral - Puree Decreased bolus cohesion;Delayed oral transit;Lingual/palatal residue;Reduced posterior propulsion;Incomplete tongue to palate contact;Weak lingual manipulation;Left anterior bolus loss Oral - Mech Soft -- Oral - Regular -- Oral - Multi-Consistency --  Oral - Pill -- Oral Phase - Comment --  CHL IP PHARYNGEAL PHASE 02/02/2021 Pharyngeal Phase Impaired Pharyngeal- Pudding Teaspoon -- Pharyngeal -- Pharyngeal- Pudding Cup -- Pharyngeal -- Pharyngeal- Honey Teaspoon Reduced pharyngeal peristalsis;Reduced epiglottic inversion;Reduced anterior laryngeal mobility;Reduced tongue base retraction;Penetration/Apiration after swallow;Moderate aspiration;Pharyngeal residue - valleculae;Pharyngeal residue - pyriform Pharyngeal Material enters airway, passes BELOW cords and not ejected out despite cough attempt by patient;Material enters airway, CONTACTS cords and not ejected out;Material enters airway, passes BELOW cords without attempt by patient to eject out (silent aspiration);Material does not enter airway Pharyngeal- Honey Cup -- Pharyngeal -- Pharyngeal- Nectar Teaspoon Reduced pharyngeal peristalsis;Reduced epiglottic inversion;Reduced anterior laryngeal mobility;Reduced tongue base retraction;Penetration/Apiration after swallow;Moderate aspiration;Pharyngeal residue - valleculae;Pharyngeal residue - pyriform Pharyngeal Material enters airway, passes BELOW cords without attempt by patient to eject out (silent aspiration);Material enters airway, passes BELOW cords and not ejected out despite cough attempt by patient;Material enters airway, CONTACTS cords and not ejected out;Material does not enter airway Pharyngeal- Nectar Cup -- Pharyngeal -- Pharyngeal- Nectar Straw -- Pharyngeal -- Pharyngeal- Thin Teaspoon -- Pharyngeal -- Pharyngeal- Thin Cup -- Pharyngeal -- Pharyngeal- Thin Straw -- Pharyngeal -- Pharyngeal- Puree Reduced pharyngeal peristalsis;Reduced epiglottic inversion;Reduced anterior laryngeal mobility;Reduced tongue base retraction;Pharyngeal residue - valleculae;Pharyngeal residue - pyriform Pharyngeal -- Pharyngeal- Mechanical Soft -- Pharyngeal -- Pharyngeal- Regular -- Pharyngeal -- Pharyngeal- Multi-consistency -- Pharyngeal -- Pharyngeal- Pill --  Pharyngeal -- Pharyngeal Comment --  No flowsheet data found. [name], MA CCC-SLP Acute Rehabilitation Services Pager [phone] Office [phone] Lynann Beaver 02/02/2021, 10:49 AM               Scheduled Meds: . carvedilol  3.125 mg Oral BID WC  . chlorhexidine  15 mL Mouth Rinse BID  . Chlorhexidine Gluconate Cloth  6 each Topical Daily  . cholecalciferol  2,000 Units Oral Daily  . dorzolamide  1 drop Both Eyes TID  . feeding supplement  237 mL Oral TID BM  . latanoprost  1 drop Both Eyes QHS  . mouth rinse  15 mL Mouth Rinse q12n4p  . pantoprazole  40 mg Oral Daily  . tamsulosin  0.4 mg Oral Daily   Continuous Infusions:    LOS: 9 days   Time spent: 47min  Robt Okuda C Adalai Perl, DO [facility]  If 7PM-7AM, please contact night-coverage [email or web address]  02/04/2021, 8:07 AM

## 2021-02-04 NOTE — Plan of Care (Signed)

## 2021-02-05 ENCOUNTER — Inpatient Hospital Stay (HOSPITAL_COMMUNITY): Payer: Medicare Other

## 2021-02-05 DIAGNOSIS — R55 Syncope and collapse: Secondary | ICD-10-CM | POA: Diagnosis not present

## 2021-02-05 MED ORDER — PANTOPRAZOLE SODIUM 40 MG PO PACK
40.0000 mg | PACK | Freq: Every day | ORAL | Status: DC
  Administered 2021-02-05 – 2021-02-06 (×2): 40 mg via ORAL
  Filled 2021-02-05 (×3): qty 20

## 2021-02-05 MED ORDER — OMEPRAZOLE 2 MG/ML ORAL SUSPENSION: 20.0000 mg | Freq: Every day | ORAL | Status: DC

## 2021-02-05 MED ORDER — PANTOPRAZOLE SODIUM 40 MG PO PACK: 40.0000 mg | PACK | Freq: Every day | ORAL | Status: DC

## 2021-02-05 MED ORDER — DOCUSATE SODIUM 50 MG/5ML PO LIQD: 100.0000 mg | Freq: Two times a day (BID) | ORAL | Status: DC | PRN

## 2021-02-05 NOTE — Plan of Care (Signed)

## 2021-02-05 NOTE — Progress Notes (Signed)
[facility] Progress Note  Patient: Ryan Lynn    IRS:[id number]  DOA: 01/25/2021     Date of Service: the patient was seen and examined on 02/05/2021  Brief hospital course: 87 yo male fell down stairs at home and brought to ER on 01/25/21. Found to have Delta, Syncope, SDH, A fib, Parotic cancer s/p XRT, GERD, ED, Glaucoma bilateral, Aspiration pneumonia complicated by C diff in 2016.  CT head 1/26 >ICH with small right parietal SDH with mild mass effect, no MLS. Mild SAH of medial right temporal lobe.  Echo 1/27 >EF 45%, grade 1 DD, RVSP 34.1 mmHg, aortic root 41 mm  Carotid duplex 1/27 >Right Carotid: Velocities in the right ICA are consistent with a 1-39% stenosis. Left Carotid: Velocities in the left ICA are consistent with a 1-39% stenosis. Vertebrals: Right vertebral artery demonstrates antegrade flow. Left vertebral artery was not visualized.   CT head 1/27 >> no change in Rt posterior SDH  Currently plan is await for transfer to CIR.  Assessment and Plan: Traumatic SDH and SAH secondary to syncopal event Evaluated by neurosurgery at intake Imaging studies stable  No further intervention or imaging unless otherwise specified by neurosurgery 7 days of Keppra per neurosurgery recommendation  stopped 02/02/21 Would likely benefit from outpt follow up with neurology (previously seen by Dr. Antony Contras)  Syncopal event with new onset systolic CHF, POA PAC, PVCs. Aortic root dilation. Reported hx of PAF. Hypertension. Unclear etiology, continue to follow clinically  Continue carvedilol  Frequent PVCs noted on telemetry, consistent with his baseline Cardiology previously following given new reduced EF at 45%  no further recommendations, signed off as of January 28, 2021  Acute on chronic dysphagia in setting of prior hx of parotic cancer s/p XRT. Severe protein caloric malnutrition and cachexia. Speech therapy continues to follow  appreciate insight and  recommendations Continue flutter, incentive spirometry: continues to have weak wet cough  OOB to chair for all meals  no more eating/drinking while suppine High risk for aspiration given his history  barium swallow shows as expected acute on chronic dysphagia, hopefully will continue to improve with ongoing speech therapy. May require a feeding tube but for now will monitor.  Glaucoma. continue eye drops Dorzolamide 3 times daily, latanoprost nightly   Urinary outlet obstruction, unspecified Patient finally agreeable to foley catheter - will likely need to follow up with previous urologist once discharged Add flomax  follow UOP/symptoms  Deconditioning, chronic Continue PT OT  Body mass index is 20.54 kg/m.  Nutrition Problem: Severe Malnutrition Etiology: chronic illness Interventions: Interventions: Refer to RD note for recommendations,Hormel Shake,Ensure Enlive (each supplement provides 350kcal and 20 grams of protein)  Pressure Injury Arm Anterior;Lower;Right (Active)     Location: Arm  Location Orientation: Anterior;Lower;Right  Staging:   Wound Description (Comments):   Present on Admission:     Diet: Dysphagia 1 diet DVT Prophylaxis:   SCDs Start: 01/26/21 0206   Advance goals of care discussion: DNR  Family Communication: family was present at bedside, at the time of interview.  The pt provided permission to discuss medical plan with the family. Opportunity was given to ask question and all questions were answered satisfactorily.   Disposition:  Status is: Inpatient  Remains inpatient appropriate because:Unsafe d/c plan  Dispo: The patient is from: Home              Anticipated d/c is to: CIR  Anticipated d/c date is: 1 day              Patient currently is medically stable to d/c.   Difficult to place patient   Subjective: Continues to have cough.  Concern regarding swallowing.  No nausea no vomiting.  No chest pain.  No abdominal  pain.  Physical Exam:  General: Appear in moderate distress, no Rash; Oral Mucosa shows Food particles. no Abnormal Neck Mass Or lumps, Conjunctiva normal  Cardiovascular: S1 and S2 Present, no Murmur, Respiratory: good respiratory effort, Bilateral Air entry present and faint Crackles, no wheezes Abdomen: Bowel Sound present, Soft and no tenderness Extremities: no Pedal edema Neurology: alert and oriented to time, place, and person affect flat in affect. no new focal deficit Gait not checked due to patient safety concerns  Vitals:   02/05/21 0411 02/05/21 0842 02/05/21 1100 02/05/21 1548  BP: 124/73 127/70 128/77 121/70  Pulse: 87 88 99 93  Resp: 18 18 17 18   Temp: 97.7 F (36.5 C) 98.1 F (36.7 C) 98 F (36.7 C) 98.3 F (36.8 C)  TempSrc: Oral Oral Oral Oral  SpO2: 95% 94% 95% 93%  Weight:      Height:        Intake/Output Summary (Last 24 hours) at 02/05/2021 1725 Last data filed at 02/04/2021 1843 Gross per 24 hour  Intake -  Output 525 ml  Net -525 ml   Filed Weights   01/25/21 2340 01/26/21 0640 02/02/21 0500  Weight: 69.4 kg 61.1 kg 68.7 kg    Data Reviewed: I have personally reviewed and interpreted daily labs, tele strips, imaging. I reviewed all nursing notes, pharmacy notes, vitals, pertinent old records I have discussed plan of care as described above with RN and patient/family.  CBC: Recent Labs  Lab 01/30/21 0146 01/31/21 0246 02/01/21 0436 02/02/21 0402 02/04/21 0302  WBC 7.7 9.2 7.9 6.8 11.5*  HGB 12.5* 11.0* 10.3* 10.6* 10.9*  HCT 36.0* 33.8* 29.7* 32.9* 32.5*  MCV 92.3 93.9 92.8 94.0 93.4  PLT 207 189 208 247 0000000   Basic Metabolic Panel: Recent Labs  Lab 01/30/21 0146 01/31/21 0246 02/01/21 0436 02/02/21 0402 02/04/21 0302  NA 133* 135 133* 132* 133*  K 3.5 3.3* 3.2* 3.9 4.3  CL 99 101 99 99 98  CO2 24 26 25 25 25   GLUCOSE 119* 102* 118* 116* 122*  BUN 15 18 19 14 16   CREATININE 0.70 0.68 0.68 0.65 0.70  CALCIUM 8.6* 8.4* 8.2*  8.1* 8.2*    Studies: DG CHEST PORT 1 VIEW  Result Date: 02/05/2021 CLINICAL DATA:  Shortness of breath. EXAM: PORTABLE CHEST 1 VIEW COMPARISON:  January 25, 2021 FINDINGS: Calcific atherosclerotic disease and tortuosity of the aorta. Mildly enlarged cardiac silhouette. Interval development of left pleural effusion. Patchy airspace consolidation at the left lower lung field, and more subtle airspace consolidation throughout the right lung parenchyma. Osseous structures are without acute abnormality. Soft tissues are grossly normal. IMPRESSION: Interval development of left pleural effusion with patchy airspace consolidation at the left lower lung field and more subtle airspace consolidation throughout the right lung parenchyma. Findings concerning for atypical pneumonia or aspiration pneumonia. Electronically Signed   By: Fidela Salisbury M.D.   On: 02/05/2021 12:33    Scheduled Meds: . carvedilol  3.125 mg Oral BID WC  . chlorhexidine  15 mL Mouth Rinse BID  . Chlorhexidine Gluconate Cloth  6 each Topical Daily  . dorzolamide  1 drop Both Eyes TID  .  feeding supplement  237 mL Oral TID BM  . latanoprost  1 drop Both Eyes QHS  . mouth rinse  15 mL Mouth Rinse q12n4p  . pantoprazole sodium  40 mg Oral Daily  . tamsulosin  0.4 mg Oral Daily   Continuous Infusions: PRN Meds: docusate, guaiFENesin, labetalol, loperamide HCl, polyethylene glycol, Resource ThickenUp Clear  Time spent: 35 minutes  Author: Berle Mull, MD [facility] 02/05/2021 5:25 PM  To reach On-call, see care teams to locate the attending and reach out via [email or web address]. Between 7PM-7AM, please contact night-coverage If you still have difficulty reaching the attending provider, please page the [facility] (Director on Call) for [facility] on amion for assistance.

## 2021-02-05 NOTE — Progress Notes (Signed)
Speech Language Pathology Treatment: Dysphagia  Patient Details Name: Ryan Lynn MRN: [id number] DOB: 1934/11/02 Today's Date: 02/05/2021 Time: 5400-8676 SLP Time Calculation (min) (ACUTE ONLY): 29 min  Assessment / Plan / Recommendation Clinical Impression  Patient seen for treatment during am meal which was started by patient's wife prior to SLP arrival. Patient consuming nectar thick liquid (per patient preference understanding aspiration risk) and mechanical soft solids brought from home. Patient continues to present with wet vocal quality, and both immediate and delayed coughing during po intake consistent with MBS results indicative of aspiration risk across consistencies. Encourage patient to cough and complete effortful swallow with any wet vocal quality in attempts to reduce degree of aspiration and improve laryngeal/pharyngeal strength. SLP attempted both cup and straw sips to assess for improved oral strength. Mild anterior labial spillage on the left noted with cup sips. Patient was however able to utilize adequate labial seal today for small straw sips, improved from MBS days prior, of nectar thick liquid with decreased s/s of aspiration (wet vocal quality) when consumed without solids. Patient reported hesitancy to utilize left head turn recommended by MBS, stating that he didn't notice a difference regardless. Encourage him to utilize left head turn, wife instructed to stand on left side to facilitate use, as MBS did note decreased aspiration risk. Discussed diet (dysphagia 3 in computer) and patient reporting that even oatmeal felt too "grainy" for him to consume. Educated regarding possible solid diet recommendations including dysphagia 2 and dysphagia 1 which may broaden solid po choices provided by hospital. Patient and family in agreement to try dysphagia 1 (puree) solids, wife may continue to bring in soft textures up to dysphagia 3 consistency. Will downgrade diet and continue to f/u.  Recommend trial of RMST to facilitate improved strength of swallow and cough for airway protection.    HPI HPI: Pt is an 87 y.o. 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 He presented to [facility] ED 1/26 after he fell at home down a flight of stairs.  CT head 1/27: Acute intracranial hemorrhage with small right parietal subdural hematoma demonstrating mild mass effect upon the right cerebral hemisphere. CXR 1/26: No acute infiltrate or edema. 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 Pt's wife reported that the had swallow studies in the past and provided a sheet with instructions which he has given. Per the wife, pt has vocal fold atrophy and must use chin tuck, effortful swallow, and drink from a straw. Pill sizes that are larger than the size of an aspirin are crushed at home. Liquids must be thickened to "mild to moderate" thickness and he consumed "soft and moist" foods. Pt's wife stated noodles, potatoes, pasta, etc, but stated that the pt cannot control soup. Pt's wife showed SLP the amount of thickener that she typically uses and it appears that he is on nectar thick liquids. CT cervical spine on admission revealed severe degenerative disc disease, particularly at C4-6. Pt's most recent swallow study was 09/25/19, at which time deteriorating swallow was revealed with intermittent aspiration and signficant pharyngeal residue.  Pt participated in OP SLP for dysphagia tx October-December of 2020 (these records are available under a different MRN).      SLP Plan  Continue with current plan of care       Recommendations  Diet recommendations: Dysphagia 1 (puree);Nectar-thick liquid Liquids provided via: Teaspoon;Cup;Straw Medication Administration: Crushed with puree Supervision: Staff to assist with self feeding;Full supervision/cueing for  compensatory strategies Compensations: Slow rate;Small sips/bites;Multiple  dry swallows after each bite/sip;Follow solids with liquid;Effortful swallow;Clear throat intermittently;Other (Comment) (left head turn) Postural Changes and/or Swallow Maneuvers: Out of bed for meals;Seated upright 90 degrees;Upright 30-60 min after meal;Head turn left during swallow                General recommendations: Rehab consult Oral Care Recommendations: Oral care BID Follow up Recommendations: Inpatient Rehab SLP Visit Diagnosis: Dysphagia, pharyngeal phase (R13.13) Plan: Continue with current plan of care       Ryan Lynn, Ryan Lynn 02/05/2021, 10:08 AM

## 2021-02-06 ENCOUNTER — Inpatient Hospital Stay (HOSPITAL_COMMUNITY): Payer: Medicare Other

## 2021-02-06 LAB — CBC
HCT: 36.5 % — ABNORMAL LOW (ref 39.0–52.0)
Hemoglobin: 11.9 g/dL — ABNORMAL LOW (ref 13.0–17.0)
MCH: 30.6 pg (ref 26.0–34.0)
MCHC: 32.6 g/dL (ref 30.0–36.0)
MCV: 93.8 fL (ref 80.0–100.0)
Platelets: 439 10*3/uL — ABNORMAL HIGH (ref 150–400)
RBC: 3.89 MIL/uL — ABNORMAL LOW (ref 4.22–5.81)
RDW: 12.9 % (ref 11.5–15.5)
WBC: 13.7 10*3/uL — ABNORMAL HIGH (ref 4.0–10.5)
nRBC: 0 % (ref 0.0–0.2)

## 2021-02-06 LAB — BASIC METABOLIC PANEL
Anion gap: 11 (ref 5–15)
BUN: 15 mg/dL (ref 8–23)
CO2: 24 mmol/L (ref 22–32)
Calcium: 8.2 mg/dL — ABNORMAL LOW (ref 8.9–10.3)
Chloride: 98 mmol/L (ref 98–111)
Creatinine, Ser: 0.64 mg/dL (ref 0.61–1.24)
GFR, Estimated: >60 mL/min (ref >60)
Glucose, Bld: 112 mg/dL — ABNORMAL HIGH (ref 70–99)
Potassium: 4.2 mmol/L (ref 3.5–5.1)
Sodium: 133 mmol/L — ABNORMAL LOW (ref 135–145)

## 2021-02-06 LAB — PROCALCITONIN: Procalcitonin: 0.17 ng/mL

## 2021-02-06 MED ORDER — IOHEXOL 300 MG/ML  SOLN
75.0000 mL | Freq: Once | INTRAMUSCULAR | Status: AC | PRN
  Administered 2021-02-06: 75 mL via INTRAVENOUS

## 2021-02-06 MED ORDER — SODIUM CHLORIDE 0.9 % IV SOLN
3.0000 g | Freq: Three times a day (TID) | INTRAVENOUS | Status: DC
  Administered 2021-02-06 – 2021-02-07 (×3): 3 g via INTRAVENOUS
  Filled 2021-02-06 (×2): qty 8
  Filled 2021-02-06: qty 3
  Filled 2021-02-06 (×2): qty 8

## 2021-02-06 MED ORDER — SACCHAROMYCES BOULARDII 250 MG PO CAPS
250.0000 mg | ORAL_CAPSULE | Freq: Two times a day (BID) | ORAL | Status: DC
  Administered 2021-02-06: 250 mg via ORAL
  Filled 2021-02-06 (×2): qty 1

## 2021-02-06 NOTE — Progress Notes (Signed)
Pharmacy Antibiotic Note  Ryan Lynn is a 87 y.o. male admitted on 01/25/2021 with pneumonia.  Pharmacy has been consulted for Unasyn dosing.  Pt has been here for her ICH after fall. Unasyn has been ordered to empirically covering for aspiration PNA. WBC 11.5, plt <1.   Plan: Unasyn 3g IV q8  Height: 6' (182.9 cm) Weight: 68.7 kg (151 lb 7.3 oz) IBW/kg (Calculated) : 77.6  Temp (24hrs), Avg:98.1 F (36.7 C), Min:97.4 F (36.3 C), Max:99.3 F (37.4 C)  Recent Labs  Lab 01/31/21 0246 02/01/21 0436 02/02/21 0402 02/04/21 0302  WBC 9.2 7.9 6.8 11.5*  CREATININE 0.68 0.68 0.65 0.70    Estimated Creatinine Clearance: 66.8 mL/min (by C-G formula based on SCr of 0.7 mg/dL).    No Known Allergies  Antimicrobials this admission: 2/7 unasyn>>  Dose adjustments this admission:   Microbiology results: 2/7 blood>> MRSA neg   Onnie Boer, PharmD, BCIDP, AAHIVP, CPP Infectious Disease Pharmacist 02/06/2021 8:37 AM

## 2021-02-06 NOTE — Progress Notes (Signed)
Inpatient Rehab Admissions Coordinator:   I have insurance authorization for CIR for this Pt., but do not have a bed to offer this Pt. Today. I will continue to follow for potential admission pending bed availability and insurance auth.  Clemens Catholic, [place], [place] Admissions Coordinator  [phone] ([place]) [phone] (office)

## 2021-02-06 NOTE — Plan of Care (Signed)

## 2021-02-06 NOTE — Progress Notes (Signed)
Physical Therapy Treatment Patient Details Name: Ryan Lynn MRN: [id number] DOB: 05-19-1934 Today's Date: 02/06/2021    History of Present Illness 87 yo admitted 1/26 after fall down stairs possibly with syncope. Pt with new Afib with RVR with small right SDH, SAH. 1/7: Pt now with aspiration PNA. PMhx: ICH in 2021    PT Comments    Pt very fatigued today and needed encouragement to get OOB. Min HHA to come to EOB to sitting. Pt was dizzy in sitting and standing. He has some dizziness at baseline but was worse today. Min A for sit>stand and pivot to recliner. Pt remained on 4L O2 with sats dropping from mid 90's to 87% with activity. Further ambulation deferred at pt request due to fatigue.  Pt assisted with lunch tray set up in recliner. Wife present during session.   PT will continue to follow.   Follow Up Recommendations  CIR;Supervision/Assistance - 24 hour     Equipment Recommendations  Rolling walker with 5" wheels    Recommendations for Other Services       Precautions / Restrictions Precautions Precautions: Fall Precaution Comments: obturator for eating, HOH speak to right ear Restrictions Weight Bearing Restrictions: No    Mobility  Bed Mobility Overal bed mobility: Needs Assistance Bed Mobility: Supine to Sit     Supine to sit: HOB elevated;Min assist     General bed mobility comments: pt able to bring LE's off EOB, min HHA for elevation of trunk into sitting from elevated bed  Transfers Overall transfer level: Needs assistance Equipment used: Rolling walker (2 wheeled) Transfers: Sit to/from Stand Sit to Stand: Min assist Stand pivot transfers: Min assist       General transfer comment: min A for power up with vc's for hand placement. Extra time needed for all mvmt. Specific vc's for sequencing with stepping feet bed to recliner.  Ambulation/Gait             General Gait Details: pt deferred today due to fatigue and dizziness that he felt in  standing   Stairs             Wheelchair Mobility    Modified Rankin (Stroke Patients Only) Modified Rankin (Stroke Patients Only) Pre-Morbid Rankin Score: No significant disability Modified Rankin: Moderately severe disability     Balance Overall balance assessment: Needs assistance Sitting-balance support: Feet supported;Bilateral upper extremity supported Sitting balance-Leahy Scale: Fair Sitting balance - Comments: pt able to maintain balance sitting EOB without UE support Postural control: Posterior lean;Other (comment) (L bias) Standing balance support: Bilateral upper extremity supported Standing balance-Leahy Scale: Poor Standing balance comment: RW for support with flexed posture and min A                            Cognition Arousal/Alertness: Awake/alert Behavior During Therapy: WFL for tasks assessed/performed Overall Cognitive Status: Impaired/Different from baseline Area of Impairment: Attention;Following commands;Safety/judgement;Problem solving;Memory                 Orientation Level:  (pt minimally verbal and difficult to understand his speech) Current Attention Level: Sustained Memory: Decreased short-term memory Following Commands: Follows one step commands consistently;Follows one step commands with increased time;Follows multi-step commands with increased time Safety/Judgement: Decreased awareness of safety;Decreased awareness of deficits Awareness: Emergent Problem Solving: Slow processing;Requires verbal cues;Requires tactile cues;Difficulty sequencing General Comments: pt relays that he has doubts that he will return to LOF prior to falling. Seems very  down today as well as exhausted      Exercises      General Comments General comments (skin integrity, edema, etc.): pt reports that he struggles with dizziness at baseline since his radiation but some days are worse than others. He has associated nausea as well. Wife present  for session. SPO2 dropped from 90's to 87% wit activity, remained on 4L O2 throughout. HR 88 bpm      Pertinent Vitals/Pain Pain Assessment: Faces Faces Pain Scale: Hurts little more Pain Location: generalized Pain Descriptors / Indicators: Pressure;Grimacing Pain Intervention(s): Limited activity within patient's tolerance;Monitored during session    Home Living                      Prior Function            PT Goals (current goals can now be found in the care plan section) Acute Rehab PT Goals Patient Stated Goal: to get better and go home PT Goal Formulation: With patient/family Time For Goal Achievement: 02/10/21 Potential to Achieve Goals: Fair Progress towards PT goals: Not progressing toward goals - comment (fatigue)    Frequency    Min 3X/week      PT Plan Current plan remains appropriate    Co-evaluation              AM-PAC PT "6 Clicks" Mobility   Outcome Measure  Help needed turning from your back to your side while in a flat bed without using bedrails?: A Little Help needed moving from lying on your back to sitting on the side of a flat bed without using bedrails?: A Little Help needed moving to and from a bed to a chair (including a wheelchair)?: A Little Help needed standing up from a chair using your arms (e.g., wheelchair or bedside chair)?: A Little Help needed to walk in hospital room?: A Lot Help needed climbing 3-5 steps with a railing? : A Lot 6 Click Score: 16    End of Session Equipment Utilized During Treatment: Gait belt Activity Tolerance: Patient limited by fatigue Patient left: with call bell/phone within reach;with family/visitor present;in chair;with chair alarm set Nurse Communication: Mobility status PT Visit Diagnosis: Other abnormalities of gait and mobility (R26.89);Difficulty in walking, not elsewhere classified (R26.2);Unsteadiness on feet (R26.81);History of falling (Z91.81)     Time: 5277-8242 PT Time  Calculation (min) (ACUTE ONLY): 22 min  Charges:  $Therapeutic Activity: 8-22 mins                     Leighton Roach, PT  Acute Rehab Services  Pager [phone] Office [place] 02/06/2021, 2:00 PM

## 2021-02-06 NOTE — Progress Notes (Signed)
[facility] Progress Note  Patient: Ryan Lynn    WER:[id number]  DOA: 01/25/2021     Date of Service: the patient was seen and examined on 02/06/2021  Brief hospital course: 87 yo male fell down stairs at home and brought to ER on 01/25/21. Found to have [place], Syncope, SDH, A fib, Parotic cancer s/p XRT, GERD, ED, Glaucoma bilateral, Aspiration pneumonia complicated by C diff in 2016.  CT head 1/26 >ICH with small right parietal SDH with mild mass effect, no MLS. Mild SAH of medial right temporal lobe.  Echo 1/27 >EF 45%, grade 1 DD, RVSP 34.1 mmHg, aortic root 41 mm  Carotid duplex 1/27 >Right Carotid: Velocities in the right ICA are consistent with a 1-39% stenosis. Left Carotid: Velocities in the left ICA are consistent with a 1-39% stenosis. Vertebrals: Right vertebral artery demonstrates antegrade flow. Left vertebral artery was not visualized.   CT head 1/27 >> no change in Rt posterior SDH  Currently plan is treat aspiration pneumonia. Continue to engage with family regarding goals of care.  Assessment and Plan: Traumatic SDH and SAH secondary to syncopal event Evaluated by neurosurgery at intake Imaging studies stable  No further intervention or imaging unless otherwise specified by neurosurgery 7 days of Keppra per neurosurgery recommendation  stopped 02/02/21 Would likely benefit from outpt follow up with neurology (previously seen by Dr. Antony Contras)  Syncopal event with new onset systolic CHF, POA PAC, PVCs. Aortic root dilation. Reported hx of PAF. Hypertension. Unclear etiology, continue to follow clinically  Continue carvedilol  Frequent PVCs noted on telemetry, consistent with his baseline Cardiology previously following given new reduced EF at 45%  no further recommendations, signed off as of January 28, 2021  Acute on chronic dysphagia in setting of prior hx of parotic cancer s/p XRT. Severe protein caloric malnutrition and cachexia. Aspiration  pneumonia Speech therapy continues to follow  appreciate insight and recommendations Continue flutter, incentive spirometry: continues to have weak wet cough  OOB to chair for all meals  no more eating/drinking while suppine Family is aware that the patient is high risk for aspiration given his history  barium swallow shows as expected acute on chronic dysphagia, with aspiration at all fluid consistency. Chest x-ray on 2/6 showed evidence of pneumonia with left pleural effusion. CT chest with IV contrast shows loculated effusion on the left along with food particles in tracheal lumen. Started on IV Unasyn. Not a good candidate for feeding tube given his risk for aspiration.  Glaucoma. continue eye drops Dorzolamide 3 times daily, latanoprost nightly   Urinary outlet obstruction, unspecified Patient finally agreeable to foley catheter - will likely need to follow up with previous urologist once discharged Add flomax  follow UOP/symptoms  Deconditioning, chronic Continue PT OT  Goals of care conversation. Patient continues to complain of severe cough as well as complain of fatigue and tiredness and shortness of breath. Remains ongoing risk for aspiration. Unable to tolerate any consistency without any aspiration. Not sure how well he will tolerate thoracentesis for the loculated pleural effusion. Currently being admitted with syncope and subdural hematoma and subarachnoid hematoma treated conservatively. Also has prior history of malignancy treated with radiotherapy and currently has failure to thrive with severe protein calorie malnutrition. Patient is currently DNR. I informed wife that patient may not be able to stable enough to leave the hospital and even in the scenario will have recurrent hospitalization going forward given his severe deconditioning and worsening dysphagia. Recommend wife to consider  comfort care for the patient. We will follow up again on 2/8.  Body mass  index is 20.54 kg/m.  Nutrition Problem: Severe Malnutrition Etiology: chronic illness Interventions: Interventions: Refer to RD note for recommendations,Hormel Shake,Ensure Enlive (each supplement provides 350kcal and 20 grams of protein)     Diet: Dysphagia 1 diet DVT Prophylaxis:   SCDs Start: 01/26/21 0206   Advance goals of care discussion: DNR  Family Communication: family was present at bedside,The pt provided permission to discuss medical plan with the family. Opportunity was given to ask question and all questions were answered satisfactorily.   Disposition:  Status is: Inpatient  Remains inpatient appropriate because:Unsafe d/c plan  Dispo: The patient is from: Home              Anticipated d/c is to: CIR              Anticipated d/c date is: 1 day              Patient currently is medically stable to d/c.   Difficult to place patient   Subjective: Minimal oral intake. Reports his fatigue and tired. Also reports ongoing cough. No nausea no vomiting.  Physical Exam:  General: Appear in moderate distress, no Rash; Oral Mucosa Clear, moist. no Abnormal Neck Mass Or lumps, Conjunctiva normal  Cardiovascular: S1 and S2 Present, no Murmur, Respiratory: increased respiratory effort, Bilateral Air entry present and bilateral  Crackles, no wheezes Abdomen: Bowel Sound present, Soft and no tenderness Extremities: trace Pedal edema Neurology: alert and oriented to time, place, and person affect flat. no new focal deficit Gait not checked due to patient safety concerns    Vitals:   02/06/21 0723 02/06/21 1117 02/06/21 1827 02/06/21 1927  BP: 123/60 128/74 129/70 112/68  Pulse: 96 93 91 95  Resp: 20 20 20 17   Temp: 98 F (36.7 C) 99.6 F (37.6 C) 98.5 F (36.9 C) 98.4 F (36.9 C)  TempSrc: Oral Oral Oral Oral  SpO2: 96% 94% 94% 90%  Weight:      Height:        Intake/Output Summary (Last 24 hours) at 02/06/2021 1936 Last data filed at 02/06/2021 0505 Gross per 24  hour  Intake --  Output 400 ml  Net -400 ml   Filed Weights   01/25/21 2340 01/26/21 0640 02/02/21 0500  Weight: 69.4 kg 61.1 kg 68.7 kg    Data Reviewed: I have personally reviewed and interpreted daily labs, tele strips, imaging. I reviewed all nursing notes, pharmacy notes, vitals, pertinent old records I have discussed plan of care as described above with RN and patient/family.  CBC: Recent Labs  Lab 01/31/21 0246 02/01/21 0436 02/02/21 0402 02/04/21 0302 02/06/21 0912  WBC 9.2 7.9 6.8 11.5* 13.7*  HGB 11.0* 10.3* 10.6* 10.9* 11.9*  HCT 33.8* 29.7* 32.9* 32.5* 36.5*  MCV 93.9 92.8 94.0 93.4 93.8  PLT 189 208 247 318 [id number]*   Basic Metabolic Panel: Recent Labs  Lab 01/31/21 0246 02/01/21 0436 02/02/21 0402 02/04/21 0302 02/06/21 0912  NA 135 133* 132* 133* 133*  K 3.3* 3.2* 3.9 4.3 4.2  CL 101 99 99 98 98  CO2 26 25 25 25 24   GLUCOSE 102* 118* 116* 122* 112*  BUN 18 19 14 16 15   CREATININE 0.68 0.68 0.65 0.70 0.64  CALCIUM 8.4* 8.2* 8.1* 8.2* 8.2*    Studies: CT CHEST W CONTRAST  Addendum Date: 02/06/2021   ADDENDUM REPORT: 02/06/2021 19:06 ADDENDUM: Ascending thoracic aortic dilatation  to 4.3 cm. Recommend annual imaging followup by CTA or MRA. This recommendation follows 2010 ACCF/AHA/AATS/ACR/ASA/SCA/SCAI/SIR/STS/SVM Guidelines for the Diagnosis and Management of Patients with Thoracic Aortic Disease. Circulation. 2010; [id number]. Aortic aneurysm NOS (ICD10-I71.9) These results will be called to the ordering clinician or representative by the Radiologist Assistant, and communication documented in the PACS or [employer]. Electronically Signed   By: Lovena Le M.D.   On: 02/06/2021 19:06   Result Date: 02/06/2021 CLINICAL DATA:  Pneumonia EXAM: CT CHEST WITH CONTRAST TECHNIQUE: Multidetector CT imaging of the chest was performed during intravenous contrast administration. CONTRAST:  71mL OMNIPAQUE IOHEXOL 300 MG/ML  SOLN COMPARISON:  CT 06/21/2015  PET-CT 02/09/2015 FINDINGS: Cardiovascular: Cardiac size is top normal without pericardial effusion. Few scant coronary artery calcifications. Dilatation of the ascending thoracic aorta to 4.3 cm, similar in caliber to 2016 comparison. Atherosclerotic plaque throughout the thoracic aorta. Normal 3 vessel branching of the aortic arch. Proximal great vessels are unremarkable. Central pulmonary arteries are normal caliber. No large central or lobar pulmonary arterial filling defects are identified. No major venous abnormalities. Mediastinum/Nodes: No free mediastinal fluid or gas. Extensive secretions in the trachea and central airways, particularly the left mainstem bronchus and airways of the lower lobes. No acute abnormality of the esophagus. Thyroid gland and thoracic inlet are unremarkable. Numerous low-attenuation mediastinal and hilar lymph nodes are present, largest including: 13 mm AP window lymph node (3/60) 13 mm left paratracheal node (3/58) 10 mm left hilar node (3/78) 15 mm right sub hilar lymph node (3/66). Lungs/Pleura: Moderate bilateral effusions partially tracking within the fissures. Slightly lobular contours of the effusion on the left may suggest some partial loculation. There are mixed patchy consolidative and ground-glass opacities in the dependent portions of the lungs most prominently in both upper lobes and superior segment left lower lobe. Some additional basilar opacities likely reflecting passive atelectatic change. Diffuse airways thickening and scattered secretions, particularly involving the fluid-filled airways of the left lung with more minimal thickening and secretions in the right lung airways. Scattered pleural calcifications noted towards the apices with additional punctate calcified granulomata seen elsewhere in the lungs brain since within the atelectatic left lower lobe (3/115). No pneumothorax. Some additional bandlike opacities seen throughout the lungs likely reflecting  regions of scarring related to prior infection or inflammation. Upper Abdomen: Multiple calcified gallstones present within the otherwise normal gallbladder. Fluid attenuation cyst in the left lobe liver measures 2.1 cm. Musculoskeletal: The osseous structures appear diffusely demineralized which may limit detection of small or nondisplaced fractures. Degenerative changes are present in the imaged spine and shoulders. Dextrocurvature of the midthoracic spine is similar to priors. No acute osseous abnormality or suspicious osseous lesion. Moderate bilateral gynecomastia. Circumferential body wall edema. IMPRESSION: 1. Extensive secretions in the distal trachea and central airways, particularly the left mainstem bronchus and airways of the left lung with more minimal thickening and secretions in the right lung airways. Findings are compatible with aspiration. Additional mixed patchy consolidative and ground-glass opacities in the dependent portions of the lungs most prominently in both upper lobes and superior segment left lower lobe, could reflect further sequela of aspiration or an underlying multifocal infection/pneumonia. 2. Moderate bilateral effusions partially tracking within the fissures. Suspect at least partial loculation on the left. 3. Numerous low-attenuation mediastinal and hilar lymph nodes, likely reactive. 4. Cholelithiasis without evidence of acute cholecystitis. 5. Moderate bilateral gynecomastia. 6. Aortic Atherosclerosis (ICD10-I70.0). Electronically Signed: By: Lovena Le M.D. On: 02/06/2021 18:50    Scheduled  Meds: . carvedilol  3.125 mg Oral BID WC  . chlorhexidine  15 mL Mouth Rinse BID  . Chlorhexidine Gluconate Cloth  6 each Topical Daily  . dorzolamide  1 drop Both Eyes TID  . feeding supplement  237 mL Oral TID BM  . latanoprost  1 drop Both Eyes QHS  . mouth rinse  15 mL Mouth Rinse q12n4p  . pantoprazole sodium  40 mg Oral Daily  . saccharomyces boulardii  250 mg Oral BID   . tamsulosin  0.4 mg Oral Daily   Continuous Infusions: . ampicillin-sulbactam (UNASYN) IV 3 g (02/06/21 1910)   PRN Meds: docusate, guaiFENesin, labetalol, loperamide HCl, polyethylene glycol, Resource ThickenUp Clear  Time spent: 35 minutes  Author: Berle Mull, MD [facility] 02/06/2021 7:36 PM  To reach On-call, see care teams to locate the attending and reach out via [email or web address]. Between 7PM-7AM, please contact night-coverage If you still have difficulty reaching the attending provider, please page the [facility] (Director on Call) for [facility] on amion for assistance.

## 2021-02-06 NOTE — Progress Notes (Signed)
  Speech Language Pathology Treatment: Dysphagia  Patient Details Name: Ryan Lynn MRN: [id number] DOB: [birth year] Today's Date: 02/06/2021 Time: 2993-7169 SLP Time Calculation (min) (ACUTE ONLY): 20 min  Assessment / Plan / Recommendation Clinical Impression  Pt was finishing bath upon entering room.  Assisted him to complete his own oral care.  He requested thin water despite profound swallowing difficulty, about which he is very much aware. He required cues for effortful swallow and left head turn. He has persisting wet/congested cough,  and has been diagnosed with pna.  During our session transport arrived to take him to radiology for chest CT.   Despite his desire to eat and the years he has managed to eat safely, his swallow function has deteriorated dramatically (see MBS dated 2/3).  He is experiencing the adverse consequences of dysphagia and aspiration.  Per Mrs. Meuser, Dr. Posey Pronto is planning to talk with them after the CT this afternoon.  Continue strict PO precautions as outlined per pt's desires.  SLP will follow for plan, as well as trial EMST and further education.    HPI HPI: Pt is an 87 y.o. 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 He presented to [facility] ED 1/26 after he fell at home down a flight of stairs.  CT head 1/27: Acute intracranial hemorrhage with small right parietal subdural hematoma demonstrating mild mass effect upon the right cerebral hemisphere. CXR 1/26: No acute infiltrate or edema. 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 Pt's wife reported that the had swallow studies in the past and provided a sheet with instructions which he has given. Per the wife, pt has vocal fold atrophy and must use chin tuck, effortful swallow, and drink from a straw. Pill sizes that are larger than the size of an aspirin are crushed at home. Liquids must be thickened to "mild to moderate" thickness and he  consumed "soft and moist" foods. Pt's wife stated noodles, potatoes, pasta, etc, but stated that the pt cannot control soup. Pt's wife showed SLP the amount of thickener that she typically uses and it appears that he is on nectar thick liquids. CT cervical spine on admission revealed severe degenerative disc disease, particularly at C4-6. Pt's most recent swallow study was 09/25/19, at which time deteriorating swallow was revealed with intermittent aspiration and signficant pharyngeal residue.  Pt participated in OP SLP for dysphagia tx October-December of 2020 (these records are available under a different MRN).      SLP Plan  Continue with current plan of care       Recommendations  Diet recommendations: Dysphagia 1 (puree);Nectar-thick liquid Liquids provided via: Teaspoon;Cup;Straw Medication Administration: Crushed with puree Supervision: Staff to assist with self feeding;Full supervision/cueing for compensatory strategies Compensations: Slow rate;Small sips/bites;Multiple dry swallows after each bite/sip;Follow solids with liquid;Effortful swallow;Clear throat intermittently;Other (Comment) (left head turn) Postural Changes and/or Swallow Maneuvers: Out of bed for meals;Seated upright 90 degrees;Upright 30-60 min after meal;Head turn left during swallow                Oral Care Recommendations: Oral care BID Follow up Recommendations: Inpatient Rehab SLP Visit Diagnosis: Dysphagia, pharyngeal phase (R13.13) Plan: Continue with current plan of care       GO                Ryan Lynn 02/06/2021, 4:26 PM Sharmel Ballantine L. Tivis Ringer, [place] Office number [phone] Pager [phone]

## 2021-02-07 LAB — CBC
HCT: 33 % — ABNORMAL LOW (ref 39.0–52.0)
Hemoglobin: 10.6 g/dL — ABNORMAL LOW (ref 13.0–17.0)
MCH: 30.2 pg (ref 26.0–34.0)
MCHC: 32.1 g/dL (ref 30.0–36.0)
MCV: 94 fL (ref 80.0–100.0)
Platelets: 470 10*3/uL — ABNORMAL HIGH (ref 150–400)
RBC: 3.51 MIL/uL — ABNORMAL LOW (ref 4.22–5.81)
RDW: 12.8 % (ref 11.5–15.5)
WBC: 10.8 10*3/uL — ABNORMAL HIGH (ref 4.0–10.5)
nRBC: 0.2 % (ref 0.0–0.2)

## 2021-02-07 LAB — COMPREHENSIVE METABOLIC PANEL WITH GFR
ALT: 34 U/L (ref 0–44)
AST: 33 U/L (ref 15–41)
Albumin: 1.9 g/dL — ABNORMAL LOW (ref 3.5–5.0)
Alkaline Phosphatase: 90 U/L (ref 38–126)
Anion gap: 10 (ref 5–15)
BUN: 17 mg/dL (ref 8–23)
CO2: 25 mmol/L (ref 22–32)
Calcium: 8.1 mg/dL — ABNORMAL LOW (ref 8.9–10.3)
Chloride: 98 mmol/L (ref 98–111)
Creatinine, Ser: 0.62 mg/dL (ref 0.61–1.24)
GFR, Estimated: >60 mL/min
Glucose, Bld: 91 mg/dL (ref 70–99)
Potassium: 3.8 mmol/L (ref 3.5–5.1)
Sodium: 133 mmol/L — ABNORMAL LOW (ref 135–145)
Total Bilirubin: 0.9 mg/dL (ref 0.3–1.2)
Total Protein: 5.1 g/dL — ABNORMAL LOW (ref 6.5–8.1)

## 2021-02-07 LAB — PROCALCITONIN: Procalcitonin: <0.1 ng/mL

## 2021-02-07 MED ORDER — HALOPERIDOL LACTATE 2 MG/ML PO CONC
2.0000 mg | ORAL | Status: DC | PRN
  Filled 2021-02-07: qty 1

## 2021-02-07 MED ORDER — NEPRO/CARBSTEADY PO LIQD: 237.0000 mL | Freq: Three times a day (TID) | ORAL | Status: DC

## 2021-02-07 MED ORDER — OXYCODONE HCL 20 MG/ML PO CONC: 5.0000 mg | ORAL | Status: DC | PRN

## 2021-02-07 MED ORDER — ONDANSETRON HCL 4 MG/2ML IJ SOLN: 4.0000 mg | Freq: Four times a day (QID) | INTRAMUSCULAR | Status: DC | PRN

## 2021-02-07 MED ORDER — DIPHENHYDRAMINE HCL 50 MG/ML IJ SOLN: 12.5000 mg | INTRAMUSCULAR | Status: DC | PRN

## 2021-02-07 MED ORDER — GLYCOPYRROLATE 0.2 MG/ML IJ SOLN
0.2000 mg | INTRAMUSCULAR | Status: DC | PRN
  Administered 2021-02-08: 0.2 mg via INTRAVENOUS
  Filled 2021-02-07: qty 1

## 2021-02-07 MED ORDER — GLYCOPYRROLATE 1 MG PO TABS
1.0000 mg | ORAL_TABLET | ORAL | Status: DC | PRN
  Filled 2021-02-07: qty 1

## 2021-02-07 MED ORDER — MAGIC MOUTHWASH
15.0000 mL | Freq: Four times a day (QID) | ORAL | Status: DC | PRN
  Filled 2021-02-07: qty 15

## 2021-02-07 MED ORDER — MORPHINE SULFATE (PF) 2 MG/ML IV SOLN: 2.0000 mg | INTRAVENOUS | Status: DC | PRN

## 2021-02-07 MED ORDER — LORAZEPAM 2 MG/ML PO CONC: 1.0000 mg | ORAL | Status: DC | PRN

## 2021-02-07 MED ORDER — HALOPERIDOL 0.5 MG PO TABS: 2.0000 mg | ORAL_TABLET | ORAL | Status: DC | PRN

## 2021-02-07 MED ORDER — ACETAMINOPHEN 325 MG PO TABS: 650.0000 mg | ORAL_TABLET | Freq: Four times a day (QID) | ORAL | Status: DC | PRN

## 2021-02-07 MED ORDER — ONDANSETRON 4 MG PO TBDP: 4.0000 mg | ORAL_TABLET | Freq: Four times a day (QID) | ORAL | Status: DC | PRN

## 2021-02-07 MED ORDER — LORAZEPAM 1 MG PO TABS: 1.0000 mg | ORAL_TABLET | ORAL | Status: DC | PRN

## 2021-02-07 MED ORDER — HALOPERIDOL LACTATE 5 MG/ML IJ SOLN: 2.0000 mg | INTRAMUSCULAR | Status: DC | PRN

## 2021-02-07 MED ORDER — GLYCOPYRROLATE 0.2 MG/ML IJ SOLN: 0.2000 mg | INTRAMUSCULAR | Status: DC | PRN

## 2021-02-07 MED ORDER — ACETAMINOPHEN 650 MG RE SUPP: 650.0000 mg | Freq: Four times a day (QID) | RECTAL | Status: DC | PRN

## 2021-02-07 MED ORDER — LORAZEPAM 2 MG/ML IJ SOLN: 1.0000 mg | INTRAMUSCULAR | Status: DC | PRN

## 2021-02-07 NOTE — TOC Initial Note (Signed)
Transition of Care [facility]) - Initial/Assessment Note    Patient Details  Name: Ryan Lynn MRN: [id number] Date of Birth: 12-25-1934  Transition of Care [facility]) CM/SW Contact:    Geralynn Ochs, LCSW Phone Number: 02/07/2021, 11:03 AM  Clinical Narrative:      CSW notified by MD that lengthy discussion was had with patient and wife this morning, and they are interested in pursuing hospice care. MD suggested that hospice discuss with spouse the difference between home with hospice vs residential, as it seemed that they were unsure what they would like to do. CSW reached out to [employer] and spoke with [facility] to provide referral, they will reach out to family to discuss options. CSW to follow.             Expected Discharge Plan: Home w Hospice Care Barriers to Discharge: Continued Medical Work up   Patient Goals and CMS Choice Patient states their goals for this hospitalization and ongoing recovery are:: to keep comfortable CMS Medicare.gov Compare Post Acute Care list provided to:: Patient Represenative (must comment) Choice offered to / list presented to : Spouse  Expected Discharge Plan and Services Expected Discharge Plan: [place] Acute Care Choice: Hospice Living arrangements for the past 2 months: Single Family Home                                      Prior Living Arrangements/Services Living arrangements for the past 2 months: Single Family Home Lives with:: Spouse Patient language and need for interpreter reviewed:: No Do you feel safe going back to the place where you live?: Yes      Need for Family Participation in Patient Care: Yes (Comment) Care giver support system in place?: Yes (comment)   Criminal Activity/Legal Involvement Pertinent to Current Situation/Hospitalization: No - Comment as needed  Activities of Daily Living      Permission Sought/Granted Permission sought to share information with : Facility Contact  Representative,Family Supports Permission granted to share information with : Yes, Verbal Permission Granted  Share Information with NAME: [name]  Permission granted to share info w AGENCY: [facility]  Permission granted to share info w Relationship: Spouse     Emotional Assessment              Admission diagnosis:  Subdural hematoma ([place]) [W09.8J1B] SAH (subarachnoid hemorrhage) ([place]) [I60.9] Fall [W19.XXXA] Abrasion [T14.8XXA] Subarachnoid bleed ([place]) [I60.9] Nonsustained ventricular tachycardia ([facility]) [I47.2] Laceration of scalp, initial encounter [S01.01XA] Patient Active Problem List   Diagnosis Date Noted  . Protein-calorie malnutrition, severe 02/02/2021  . SAH (subarachnoid hemorrhage) ([place]) 01/26/2021  . Syncope and collapse 01/26/2021  . SDH (subdural hematoma) ([place]) 01/26/2021  . Atrial fibrillation with rapid ventricular response ([place]) 01/26/2021  . Scalp laceration 01/26/2021  . Fall   . Traumatic hemorrhage of cerebrum without loss of consciousness ([place])    PCP:  Shon Baton, MD Pharmacy:  No Pharmacies Listed    Social Determinants of Health (SDOH) Interventions    Readmission Risk Interventions No flowsheet data found.

## 2021-02-07 NOTE — Progress Notes (Signed)
Inpatient Rehab Admissions Coordinator:   I spoke with Pt.'s wife who stated they will most likely transition Pt. To comfort care later today. This has not been changed in Pt.'s chart yet, so CIR will wait to sign off until Pt. Has officially transitioned to comfort care.   Clemens Catholic, [place], [employer] Admissions Coordinator  [phone] ([place]) [phone] (office)

## 2021-02-07 NOTE — Plan of Care (Signed)
Pt has a weak cough but will cough when needed. Very congested. Educated during each swallow but a very high risk for aspiration. Splint made for pt to use when coughing. Problem: Education: Goal: Knowledge of General Education information will improve Description: Including pain rating scale, medication(s)/side effects and non-pharmacologic comfort measures Outcome: Progressing   Problem: Health Behavior/Discharge Planning: Goal: Ability to manage health-related needs will improve Outcome: Progressing   Problem: Clinical Measurements: Goal: Ability to maintain clinical measurements within normal limits will improve Outcome: Progressing Goal: Will remain free from infection Outcome: Progressing Goal: Diagnostic test results will improve Outcome: Progressing Goal: Respiratory complications will improve Outcome: Progressing Goal: Cardiovascular complication will be avoided Outcome: Progressing   Problem: Activity: Goal: Risk for activity intolerance will decrease Outcome: Progressing   Problem: Nutrition: Goal: Adequate nutrition will be maintained Outcome: Progressing   Problem: Coping: Goal: Level of anxiety will decrease Outcome: Progressing   Problem: Elimination: Goal: Will not experience complications related to bowel motility Outcome: Progressing Goal: Will not experience complications related to urinary retention Outcome: Progressing   Problem: Pain Managment: Goal: General experience of comfort will improve Outcome: Progressing   Problem: Safety: Goal: Ability to remain free from injury will improve Outcome: Progressing   Problem: Skin Integrity: Goal: Risk for impaired skin integrity will decrease Outcome: Progressing

## 2021-02-07 NOTE — Progress Notes (Signed)
Manufacturing engineer [facility]) Hospital Liaison note.      Received request from [place] for family interest in [facility]. Patient information has been forwarded to [facility] for review.    [place] liaison will follow up once eligibility and availability have been determined.    A Please do not hesitate to call with questions.     Thank you,    Farrel Gordon, RN, [place] (listed on [facility] under [place])     [phone]

## 2021-02-07 NOTE — Progress Notes (Signed)
[facility] Progress Note  Patient: Ryan Lynn    RDE:[id number]  DOA: 01/25/2021     Date of Service: the patient was seen and examined on 02/07/2021  Brief hospital course: 87 yo male fell down stairs at home and brought to ER on 01/25/21. Found to have [place], Syncope, SDH, A fib, Parotic cancer s/p XRT, GERD, ED, Glaucoma bilateral, Aspiration pneumonia complicated by C diff in 2016.  CT head 1/26 >ICH with small right parietal SDH with mild mass effect, no MLS. Mild SAH of medial right temporal lobe.  Echo 1/27 >EF 45%, grade 1 DD, RVSP 34.1 mmHg, aortic root 41 mm  Carotid duplex 1/27 >Right Carotid: Velocities in the right ICA are consistent with a 1-39% stenosis. Left Carotid: Velocities in the left ICA are consistent with a 1-39% stenosis. Vertebrals: Right vertebral artery demonstrates antegrade flow. Left vertebral artery was not visualized.   CT head 1/27 >> no change in Rt posterior SDH  Currently plan is to provide comfort care and monitor for stability and safe discharge.  Assessment and Plan: Traumatic SDH and SAH secondary to syncopal event Evaluated by neurosurgery at intake Imaging studies stable  No further intervention or imaging unless otherwise specified by neurosurgery 7 days of Keppra per neurosurgery recommendation  Completed 02/02/21 Would likely benefit from outpt follow up with neurology (previously seen by Dr. Antony Contras). Currently comfort care.  Syncopal event with new onset systolic CHF, POA PAC, PVCs. Aortic root dilation. Reported hx of PAF. Hypertension. Unclear etiology, continue to follow clinically  Continue carvedilol  Frequent PVCs noted on telemetry, consistent with his baseline Cardiology previously following given new reduced EF at 45%  no further recommendations, signed off as of January 28, 2021  Acute on chronic dysphagia in setting of prior hx of parotid cancer s/p XRT. Severe protein caloric malnutrition and  cachexia. Aspiration pneumonia Patient has chronic aspiration ongoing for 20+ years follows up with ENT on a regular basis. Speech therapy was consulted.  MBS was performed.  Patient at risk for aspiration regardless of the consistency of liquids and diet thickness. Family and patient desired to continue oral diet. Chest x-ray on 2/7 showed left lower lobe pneumonia.  CT chest confirmed loculated pleural effusion along with left lower lobe pneumonia and food debris's in trachea. Discussed with family regarding plan of care and goals of care and they want to focus on comfort. Informed that the patient not a good candidate for feeding tube given ongoing risk for aspiration.  Glaucoma. continue eye drops Dorzolamide 3 times daily, latanoprost nightly   Urinary outlet obstruction, unspecified Patient finally agreeable to foley catheter  Continue Foley catheter.  Deconditioning, chronic Due to recurrent pneumonia and worsening fatigue and tiredness not a good candidate for rehab.  Transitioning to comfort care.  Goals of care conversation. Patient continues to complain of severe cough as well as complain of fatigue and tiredness and shortness of breath. Remains ongoing risk for aspiration. Unable to tolerate any consistency without any aspiration. Not sure how well he will tolerate thoracentesis for the loculated pleural effusion. Currently being admitted with syncope and subdural hematoma and subarachnoid hematoma treated conservatively. Also has prior history of malignancy treated with radiotherapy and currently has failure to thrive with severe protein calorie malnutrition. Patient is currently DNR. I informed wife that patient may not be able to stable enough to leave the hospital and even in the scenario will have recurrent hospitalization going forward given his severe deconditioning and worsening  dysphagia. Recommend wife to consider comfort care for the patient. 2/8.  Discussion  with patient and wife in detail regarding aspiration pneumonia, loculated effusion as well as ongoing fatigue and weakness and presentation with SDH. Patient preferred to transition to comfort and hospice. [facility] consulted. Patient with minimal oral intake due to severe aspiration, worsening hypoxia in the setting of pneumonia and loculated pleural effusion with SDH.  Will be a good candidate to residential hospice with prognosis less than 4 weeks.  Body mass index is 20.54 kg/m.  Nutrition Problem: Severe Malnutrition Etiology: chronic illness Interventions: Interventions: Refer to RD note for recommendations,Hormel Shake,Ensure Enlive (each supplement provides 350kcal and 20 grams of protein)     Diet: Dysphagia 1 diet DVT Prophylaxis:      Advance goals of care discussion: DNR comfort care  Family Communication: family was present at bedside,The pt provided permission to discuss medical plan with the family. Opportunity was given to ask question and all questions were answered satisfactorily.   Disposition:  Status is: Inpatient  Remains inpatient appropriate because:Unsafe d/c plan  Dispo: The patient is from: Home              Anticipated d/c is to: Residential hospice              Anticipated d/c date is: 1 day              Patient currently is medically stable to d/c.   Difficult to place patient   Subjective: No oral intake.  Of fatigue and tiredness.  No nausea no vomiting.  Continues to have cough.  Continues to have some chest pain as well as abdominal pain from coughing.  Physical Exam:  General: Appear in moderate distress, no Rash; Oral Mucosa Clear, moist. no Abnormal Neck Mass Or lumps, Conjunctiva normal  Cardiovascular: S1 and S2 Present, no Murmur, Respiratory: increased respiratory effort, Bilateral Air entry present and bilateral  Crackles, no wheezes Abdomen: Bowel Sound present, Soft and no tenderness Extremities: trace Pedal edema Neurology: alert  and oriented to time, place, and person affect appropriate. no new focal deficit Gait not checked due to patient safety concerns  Vitals:   02/06/21 2307 02/07/21 0309 02/07/21 0859 02/07/21 1142  BP: 132/66 132/75 136/76 138/79  Pulse: 88 88 90 88  Resp: 18 18 17 19   Temp: 98.5 F (36.9 C) 98.3 F (36.8 C) 98.4 F (36.9 C) 98.4 F (36.9 C)  TempSrc: Oral Oral Oral Oral  SpO2: 94% 94% 93% 93%  Weight:      Height:        Intake/Output Summary (Last 24 hours) at 02/07/2021 1748 Last data filed at 02/07/2021 1500 Gross per 24 hour  Intake 218 ml  Output -  Net 218 ml   Filed Weights   01/25/21 2340 01/26/21 0640 02/02/21 0500  Weight: 69.4 kg 61.1 kg 68.7 kg    Data Reviewed: I have personally reviewed and interpreted daily labs, tele strips, imaging. I reviewed all nursing notes, pharmacy notes, vitals, pertinent old records I have discussed plan of care as described above with RN and patient/family.  CBC: Recent Labs  Lab 02/01/21 0436 02/02/21 0402 02/04/21 0302 02/06/21 0912 02/07/21 0429  WBC 7.9 6.8 11.5* 13.7* 10.8*  HGB 10.3* 10.6* 10.9* 11.9* 10.6*  HCT 29.7* 32.9* 32.5* 36.5* 33.0*  MCV 92.8 94.0 93.4 93.8 94.0  PLT 208 247 318 439* 101*   Basic Metabolic Panel: Recent Labs  Lab 02/01/21 0436 02/02/21 0402 02/04/21  0302 02/06/21 0912 02/07/21 0429  NA 133* 132* 133* 133* 133*  K 3.2* 3.9 4.3 4.2 3.8  CL 99 99 98 98 98  CO2 25 25 25 24 25   GLUCOSE 118* 116* 122* 112* 91  BUN 19 14 16 15 17   CREATININE 0.68 0.65 0.70 0.64 0.62  CALCIUM 8.2* 8.1* 8.2* 8.2* 8.1*    Studies: No results found.  Scheduled Meds: . chlorhexidine  15 mL Mouth Rinse BID  . Chlorhexidine Gluconate Cloth  6 each Topical Daily  . dorzolamide  1 drop Both Eyes TID  . feeding supplement (NEPRO CARB STEADY)  237 mL Oral TID BM  . latanoprost  1 drop Both Eyes QHS  . mouth rinse  15 mL Mouth Rinse q12n4p   Continuous Infusions:  PRN Meds: acetaminophen **OR**  acetaminophen, diphenhydrAMINE, docusate, glycopyrrolate **OR** glycopyrrolate **OR** glycopyrrolate, guaiFENesin, haloperidol **OR** haloperidol **OR** haloperidol lactate, loperamide HCl, LORazepam **OR** LORazepam **OR** LORazepam, LORazepam, magic mouthwash, morphine injection, ondansetron **OR** ondansetron (ZOFRAN) IV, oxyCODONE **OR** oxyCODONE, polyethylene glycol, Resource ThickenUp Clear  Time spent: 35 minutes  Author: Berle Mull, MD [facility] 02/07/2021 5:48 PM  To reach On-call, see care teams to locate the attending and reach out via [email or web address]. Between 7PM-7AM, please contact night-coverage If you still have difficulty reaching the attending provider, please page the [facility] (Director on Call) for [facility] on amion for assistance.

## 2021-02-07 NOTE — Progress Notes (Signed)
Occupational Therapy Treatment Patient Details Name: Ryan Lynn MRN: [id number] DOB: 1934/10/21 Today's Date: 02/07/2021    History of present illness 87 yo admitted 1/26 after fall down stairs possibly with syncope. Pt with new Afib with RVR with small right SDH, SAH. 1/7: Pt now with aspiration PNA. PMhx: ICH in 2021   OT comments  Patient met lying supine in bed. Wife present at bedside. OT notified of patient transition to comfort care/hospice after initiation of treatment session. Patient completed grooming tasks at bed level with Min A declining EOB/OOB activities this date. OT provided patient/family education on activities of enjoyment prior to exiting room. Per MD note, patient to transition to comfort care/hospice in the coming days. OT to sign off at this time.   Follow Up Recommendations       Equipment Recommendations  Other (comment) (Defer to next level of care)    Recommendations for Other Services      Precautions / Restrictions Precautions Precautions: Fall Precaution Comments: obturator for eating, HOH speak to right ear Restrictions Weight Bearing Restrictions: No       Mobility Bed Mobility Overal bed mobility: Needs Assistance             General bed mobility comments: Patient declined.  Transfers Overall transfer level: Needs assistance               General transfer comment: Patient declined.    Balance Overall balance assessment: Needs assistance                                         ADL either performed or assessed with clinical judgement   ADL       Grooming: Set up;Bed level                                 General ADL Comments: Patient declined all EOB/OOB activity.     Vision       Perception     Praxis      Cognition Arousal/Alertness: Awake/alert Behavior During Therapy: WFL for tasks assessed/performed Overall Cognitive Status: Impaired/Different from baseline Area of  Impairment: Attention;Following commands;Safety/judgement;Problem solving;Memory                 Orientation Level:  (pt minimally verbal and difficult to understand his speech) Current Attention Level: Sustained Memory: Decreased short-term memory Following Commands: Follows one step commands consistently;Follows one step commands with increased time;Follows multi-step commands with increased time Safety/Judgement: Decreased awareness of safety;Decreased awareness of deficits Awareness: Emergent Problem Solving: Slow processing;Requires verbal cues;Requires tactile cues;Difficulty sequencing          Exercises     Shoulder Instructions       General Comments Wife present at bedside. News of new hospice recommendations during OT treatment.    Pertinent Vitals/ Pain       Pain Assessment: Faces Faces Pain Scale: Hurts little more Pain Location: generalized Pain Descriptors / Indicators: Pressure;Grimacing Pain Intervention(s): Limited activity within patient's tolerance;Monitored during session;Repositioned  Home Living                                          Prior Functioning/Environment  Frequency           Progress Toward Goals  OT Goals(current goals can now be found in the care plan section)  Progress towards OT goals:  (Patient to transition to comfort care/hospice)  Acute Rehab OT Goals Patient Stated Goal: None stated  Plan Other (comment) (Orders to discontinue therapy. Patient transitioning to comfort care/hospice.)    Co-evaluation                 AM-PAC OT "6 Clicks" Daily Activity     Outcome Measure   Help from another person eating meals?: A Lot Help from another person taking care of personal grooming?: A Little Help from another person toileting, which includes using toliet, bedpan, or urinal?: A Lot Help from another person bathing (including washing, rinsing, drying)?: A Lot Help from another  person to put on and taking off regular upper body clothing?: A Little Help from another person to put on and taking off regular lower body clothing?: A Lot 6 Click Score: 14    End of Session        Activity Tolerance Patient limited by fatigue   Patient Left in bed;with call bell/phone within reach;with bed alarm set   Nurse Communication          Time: 6389-3734 OT Time Calculation (min): 30 min  Charges: OT General Charges $OT Visit: 1 Visit  Gloris Manchester OTR/L Supplemental OT, Department of rehab services [phone]   Toia Micale R H. 02/07/2021, 10:42 AM

## 2021-02-07 NOTE — Progress Notes (Signed)
PT Cancellation Note  Patient Details Name: Ryan Lynn MRN: [id number] DOB: [birth year]   Cancelled Treatment:    Reason Eval/Treat Not Completed: Other (comment) PT orders have been cancelled - pt comfort care. Abran Richard, PT Acute Rehab Services Pager [phone] [facility] [place] 02/07/2021, 10:14 AM

## 2021-02-07 NOTE — H&P (Incomplete)
Physical Medicine and Rehabilitation Admission H&P    Chief Complaint  Patient presents with  . 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  Per chart review patient lives with spouse.  Independent with assistive device.  Two-level home half bath on main level as well as bedroom.  Presented 01/25/2021 after a fall down a flight of stairs.  Transient loss of consciousness.  Cranial CT scan showed acute intracranial hemorrhage with small right parietal subdural hematoma demonstrating mild mass-effect upon the right cerebral hemisphere.  No midline shift.  Additionally, mild subarachnoid hemorrhage seen involving the medial right temporal lobe.  CT cervical spine negative.  Admission chemistries unremarkable except sodium 133 glucose 123 troponin negative, BNP 171.  Neurosurgery followed Dr. Duffy Rhody advised conservative care with follow-up CT scan unchanged size of right posterior subdural hematoma no mass-effect.  Keppra added for seizure prophylaxis x7 days.  Hospital course follow-up cardiology services questionable atrial fibrillation.  Carotid Dopplers no ICA stenosis.  Echocardiogram with ejection fraction 12% grade 1 diastolic dysfunction as well as global hypokinesis and currently plan of conservative care and follow-up outpatient cardiology services and no current plans for anticoagulation due to SDH.  Maintain on a dysphagia #1 nectar thick liquid diet.  Chest x-ray 02/05/2021 showed evidence of pneumonia with left pleural effusion.  CT of chest with IV contrast showed loculated effusion on the left along with food particles and tracheal lumen.   Patient was started on intravenous Unasyn 02/06/2021 and noted mild leukocytosis 13,700 improved to  10,800.  Patient with bouts of urinary retention requiring indwelling Foley catheter tube and Flomax added.  Physical and Occupational Therapy consulted to assess candidacy for CIR given altered mental status and decreased functional mobility was admitted for a comprehensive rehab program.  Review of Systems  Constitutional: Negative for chills and fever.  HENT: Negative for hearing loss.   Eyes: Negative for blurred vision and double vision.  Respiratory: Negative for cough and shortness of breath.   Cardiovascular: Positive for leg swelling. Negative for chest pain and palpitations.  Gastrointestinal: Positive for constipation. Negative for heartburn, nausea and vomiting.  Genitourinary: Positive for urgency. Negative for dysuria, flank pain and hematuria.  Musculoskeletal: Positive for falls, joint pain and myalgias.  Skin: Negative for rash.  Neurological: Positive for dizziness and weakness.       Dysphagia  All other systems reviewed and are negative.  History reviewed. No pertinent past medical history. History reviewed. No pertinent surgical history. History reviewed. No pertinent family history. Social History:  has no history on file for tobacco use, alcohol use, and drug use. Allergies: No Known Allergies Medications Prior to Admission  Medication Sig Dispense Refill  . cholecalciferol (VITAMIN D3) 25 MCG (1000 UNIT) tablet Take 2,000 Units by mouth daily.    . dorzolamide (TRUSOPT) 2 % ophthalmic solution 1 drop 3 (three) times daily.    Marland Kitchen latanoprost (XALATAN) 0.005 % ophthalmic solution 1 drop at bedtime.    Marland Kitchen omeprazole (PRILOSEC) 20 MG capsule Take 20 mg by mouth daily.      Drug Regimen Review Drug regimen was reviewed and remains appropriate with no significant issues identified  Home: Home Living Family/patient expects to be discharged to:: Private residence Living Arrangements: Spouse/significant  other Available Help at Discharge: Family,Available 24  hours/day Type of Home: House Home Access: Stairs to enter [employer] of Steps: 5 (17 on deck) Entrance Stairs-Rails: Right,Left Home Layout: Two level,Multi-level,1/2 bath on main level,Able to live on main level with bedroom/bathroom,Bed/bath upstairs Alternate Level Stairs-Number of Steps: flight Bathroom Shower/Tub: Multimedia programmer: Standard Bathroom Accessibility: Yes Home Equipment: Cane - single point   Functional History: Prior Function Level of Independence: Independent with assistive device(s) Comments: 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)  Functional Status:  Mobility: Bed Mobility Overal bed mobility: Needs Assistance Bed Mobility: Supine to Sit Rolling: Min guard Sidelying to sit: Min assist Supine to sit: HOB elevated,Min assist Sit to supine: Min assist General bed mobility comments: pt able to bring LE's off EOB, min HHA for elevation of trunk into sitting from elevated bed Transfers Overall transfer level: Needs assistance Equipment used: Rolling walker (2 wheeled) Transfers: Sit to/from Stand Sit to Stand: Min assist Stand pivot transfers: Min assist General transfer comment: min A for power up with vc's for hand placement. Extra time needed for all mvmt. Specific vc's for sequencing with stepping feet bed to recliner. Ambulation/Gait Ambulation/Gait assistance: Min assist Gait Distance (Feet): 180 Feet Assistive device: Rolling walker (2 wheeled) Gait Pattern/deviations: Decreased stride length,Step-through pattern,Trunk flexed,Shuffle,Decreased step length - right,Decreased dorsiflexion - right General Gait Details: pt deferred today due to fatigue and dizziness that he felt in standing Gait velocity: reduced Gait velocity interpretation: <1.31 ft/sec, indicative of household ambulator    ADL: ADL Overall ADL's : Needs assistance/impaired Grooming: Minimal  assistance,Standing Upper Body Bathing: Minimal assistance,Sitting Lower Body Bathing: Moderate assistance,Sit to/from stand Upper Body Dressing : Minimal assistance,Sitting Lower Body Dressing: Moderate assistance,Sit to/from stand Toilet Transfer: Minimal assistance,+2 for safety/equipment,RW Toilet Transfer Details (indicate cue type and reason): Requires Mod A at times wtihout RW due to posterior lean and L bias Toileting- Clothing Manipulation and Hygiene: Moderate assistance Functional mobility during ADLs: Minimal assistance,+2 for safety/equipment,Rolling walker,Cueing for safety,Cueing for sequencing General ADL Comments: Oral hygiene standing at sink level with Min A for steadying. Patient able to manage tube of toothpaste with assist to place toothpaste on toothbrush only 2/2 decreased pinch strength.  Cognition: Cognition Overall Cognitive Status: Impaired/Different from baseline Orientation Level: Oriented X4 Cognition Arousal/Alertness: Awake/alert Behavior During Therapy: WFL for tasks assessed/performed Overall Cognitive Status: Impaired/Different from baseline Area of Impairment: Attention,Following commands,Safety/judgement,Problem solving,Memory Orientation Level:  (pt minimally verbal and difficult to understand his speech) Current Attention Level: Sustained Memory: Decreased short-term memory Following Commands: Follows one step commands consistently,Follows one step commands with increased time,Follows multi-step commands with increased time Safety/Judgement: Decreased awareness of safety,Decreased awareness of deficits Awareness: Emergent Problem Solving: Slow processing,Requires verbal cues,Requires tactile cues,Difficulty sequencing General Comments: pt relays that he has doubts that he will return to LOF prior to falling. Seems very down today as well as exhausted  Physical Exam: Blood pressure 132/75, pulse 88, temperature 98.3 F (36.8 C), temperature source  Oral, resp. rate 18, height 6' (1.829 m), weight 68.7 kg, SpO2 94 %. Physical Exam Neurological:     Comments: Patient sitting up in chair.  Makes eye contact with examiner.  Provides his name and age.  Follows simple commands.  He cannot recall events of the fall.     Results for orders placed or performed during the hospital encounter of 01/25/21 (from the past 48 hour(s))  CBC     Status: Abnormal  Collection Time: 02/06/21  9:12 AM  Result Value Ref Range   WBC 13.7 (H) 4.0 - 10.5 K/uL   RBC 3.89 (L) 4.22 - 5.81 MIL/uL   Hemoglobin 11.9 (L) 13.0 - 17.0 g/dL   HCT 36.5 (L) 39.0 - 52.0 %   MCV 93.8 80.0 - 100.0 fL   MCH 30.6 26.0 - 34.0 pg   MCHC 32.6 30.0 - 36.0 g/dL   RDW 12.9 11.5 - 15.5 %   Platelets 439 (H) 150 - 400 K/uL   nRBC 0.0 0.0 - 0.2 %    Comment: Performed at [facility] [address], [place], [place] [address]  Basic metabolic panel     Status: Abnormal   Collection Time: 02/06/21  9:12 AM  Result Value Ref Range   Sodium 133 (L) 135 - 145 mmol/L   Potassium 4.2 3.5 - 5.1 mmol/L   Chloride 98 98 - 111 mmol/L   CO2 24 22 - 32 mmol/L   Glucose, Bld 112 (H) 70 - 99 mg/dL    Comment: Glucose reference range applies only to samples taken after fasting for at least 8 hours.   BUN 15 8 - 23 mg/dL   Creatinine, Ser 0.64 0.61 - 1.24 mg/dL   Calcium 8.2 (L) 8.9 - 10.3 mg/dL   GFR, Estimated >60 >60 mL/min    Comment: (NOTE) Calculated using the CKD-EPI Creatinine Equation (2021)    Anion gap 11 5 - 15    Comment: Performed at [facility] [address], [place], [place] [address]  Procalcitonin - Baseline     Status: None   Collection Time: 02/06/21  9:12 AM  Result Value Ref Range   Procalcitonin 0.17 ng/mL    Comment:        Interpretation: PCT (Procalcitonin) <= 0.5 ng/mL: Systemic infection (sepsis) is not likely. Local bacterial infection is possible. (NOTE)       Sepsis PCT Algorithm           Lower Respiratory Tract                                       Infection PCT Algorithm    ----------------------------     ----------------------------         PCT < 0.25 ng/mL                PCT < 0.10 ng/mL          Strongly encourage             Strongly discourage   discontinuation of antibiotics    initiation of antibiotics    ----------------------------     -----------------------------       PCT 0.25 - 0.50 ng/mL            PCT 0.10 - 0.25 ng/mL               OR       >80% decrease in PCT            Discourage initiation of                                            antibiotics      Encourage discontinuation  of antibiotics    ----------------------------     -----------------------------         PCT >= 0.50 ng/mL              PCT 0.26 - 0.50 ng/mL               AND        <80% decrease in PCT             Encourage initiation of                                             antibiotics       Encourage continuation           of antibiotics    ----------------------------     -----------------------------        PCT >= 0.50 ng/mL                  PCT > 0.50 ng/mL               AND         increase in PCT                  Strongly encourage                                      initiation of antibiotics    Strongly encourage escalation           of antibiotics                                     -----------------------------                                           PCT <= 0.25 ng/mL                                                 OR                                        > 80% decrease in PCT                                      Discontinue / Do not initiate                                             antibiotics  Performed at [facility], [address], [place], [place] [address]   CBC     Status: Abnormal   Collection Time: 02/07/21  4:29 AM  Result Value Ref Range   WBC 10.8 (H) 4.0 - 10.5 K/uL  RBC 3.51 (L) 4.22 - 5.81 MIL/uL   Hemoglobin 10.6 (L) 13.0 - 17.0 g/dL   HCT 33.0 (L) 39.0 - 52.0  %   MCV 94.0 80.0 - 100.0 fL   MCH 30.2 26.0 - 34.0 pg   MCHC 32.1 30.0 - 36.0 g/dL   RDW 12.8 11.5 - 15.5 %   Platelets 470 (H) 150 - 400 K/uL   nRBC 0.2 0.0 - 0.2 %    Comment: Performed at [facility] [address] [place] [address] [place], [place] [address]  Comprehensive metabolic panel     Status: Abnormal   Collection Time: 02/07/21  4:29 AM  Result Value Ref Range   Sodium 133 (L) 135 - 145 mmol/L   Potassium 3.8 3.5 - 5.1 mmol/L   Chloride 98 98 - 111 mmol/L   CO2 25 22 - 32 mmol/L   Glucose, Bld 91 70 - 99 mg/dL    Comment: Glucose reference range applies only to samples taken after fasting for at least 8 hours.   BUN 17 8 - 23 mg/dL   Creatinine, Ser 0.62 0.61 - 1.24 mg/dL   Calcium 8.1 (L) 8.9 - 10.3 mg/dL   Total Protein 5.1 (L) 6.5 - 8.1 g/dL   Albumin 1.9 (L) 3.5 - 5.0 g/dL   AST 33 15 - 41 U/L   ALT 34 0 - 44 U/L   Alkaline Phosphatase 90 38 - 126 U/L   Total Bilirubin 0.9 0.3 - 1.2 mg/dL   GFR, Estimated >60 >60 mL/min    Comment: (NOTE) Calculated using the CKD-EPI Creatinine Equation (2021)    Anion gap 10 5 - 15    Comment: Performed at [facility], [place] [address], [place], [place] [address]   CT CHEST W CONTRAST  Addendum Date: 02/06/2021   ADDENDUM REPORT: 02/06/2021 19:06 ADDENDUM: Ascending thoracic aortic dilatation to 4.3 cm. Recommend annual imaging followup by CTA or MRA. This recommendation follows 2010 ACCF/AHA/AATS/ACR/ASA/SCA/SCAI/SIR/STS/SVM Guidelines for the Diagnosis and Management of Patients with Thoracic Aortic Disease. Circulation. 2010; 121: H545-G256. Aortic aneurysm NOS (ICD10-I71.9) These results will be called to the ordering clinician or representative by the Radiologist Assistant, and communication documented in the PACS or [employer]. Electronically Signed   By: Lovena Le M.D.   On: 02/06/2021 19:06   Result Date: 02/06/2021 CLINICAL DATA:  Pneumonia EXAM: CT CHEST WITH CONTRAST TECHNIQUE: Multidetector CT imaging of the  chest was performed during intravenous contrast administration. CONTRAST:  56mL OMNIPAQUE IOHEXOL 300 MG/ML  SOLN COMPARISON:  CT 06/21/2015 PET-CT 02/09/2015 FINDINGS: Cardiovascular: Cardiac size is top normal without pericardial effusion. Few scant coronary artery calcifications. Dilatation of the ascending thoracic aorta to 4.3 cm, similar in caliber to 2016 comparison. Atherosclerotic plaque throughout the thoracic aorta. Normal 3 vessel branching of the aortic arch. Proximal great vessels are unremarkable. Central pulmonary arteries are normal caliber. No large central or lobar pulmonary arterial filling defects are identified. No major venous abnormalities. Mediastinum/Nodes: No free mediastinal fluid or gas. Extensive secretions in the trachea and central airways, particularly the left mainstem bronchus and airways of the lower lobes. No acute abnormality of the esophagus. Thyroid gland and thoracic inlet are unremarkable. Numerous low-attenuation mediastinal and hilar lymph nodes are present, largest including: 13 mm AP window lymph node (3/60) 13 mm left paratracheal node (3/58) 10 mm left hilar node (3/78) 15 mm right sub hilar lymph node (3/66). Lungs/Pleura: Moderate bilateral effusions partially tracking within the fissures. Slightly lobular contours of the effusion  on the left may suggest some partial loculation. There are mixed patchy consolidative and ground-glass opacities in the dependent portions of the lungs most prominently in both upper lobes and superior segment left lower lobe. Some additional basilar opacities likely reflecting passive atelectatic change. Diffuse airways thickening and scattered secretions, particularly involving the fluid-filled airways of the left lung with more minimal thickening and secretions in the right lung airways. Scattered pleural calcifications noted towards the apices with additional punctate calcified granulomata seen elsewhere in the lungs brain since within  the atelectatic left lower lobe (3/115). No pneumothorax. Some additional bandlike opacities seen throughout the lungs likely reflecting regions of scarring related to prior infection or inflammation. Upper Abdomen: Multiple calcified gallstones present within the otherwise normal gallbladder. Fluid attenuation cyst in the left lobe liver measures 2.1 cm. Musculoskeletal: The osseous structures appear diffusely demineralized which may limit detection of small or nondisplaced fractures. Degenerative changes are present in the imaged spine and shoulders. Dextrocurvature of the midthoracic spine is similar to priors. No acute osseous abnormality or suspicious osseous lesion. Moderate bilateral gynecomastia. Circumferential body wall edema. IMPRESSION: 1. Extensive secretions in the distal trachea and central airways, particularly the left mainstem bronchus and airways of the left lung with more minimal thickening and secretions in the right lung airways. Findings are compatible with aspiration. Additional mixed patchy consolidative and ground-glass opacities in the dependent portions of the lungs most prominently in both upper lobes and superior segment left lower lobe, could reflect further sequela of aspiration or an underlying multifocal infection/pneumonia. 2. Moderate bilateral effusions partially tracking within the fissures. Suspect at least partial loculation on the left. 3. Numerous low-attenuation mediastinal and hilar lymph nodes, likely reactive. 4. Cholelithiasis without evidence of acute cholecystitis. 5. Moderate bilateral gynecomastia. 6. Aortic Atherosclerosis (ICD10-I70.0). Electronically Signed: By: Lovena Le M.D. On: 02/06/2021 18:50   DG CHEST PORT 1 VIEW  Result Date: 02/05/2021 CLINICAL DATA:  Shortness of breath. EXAM: PORTABLE CHEST 1 VIEW COMPARISON:  January 25, 2021 FINDINGS: Calcific atherosclerotic disease and tortuosity of the aorta. Mildly enlarged cardiac silhouette. Interval  development of left pleural effusion. Patchy airspace consolidation at the left lower lung field, and more subtle airspace consolidation throughout the right lung parenchyma. Osseous structures are without acute abnormality. Soft tissues are grossly normal. IMPRESSION: Interval development of left pleural effusion with patchy airspace consolidation at the left lower lung field and more subtle airspace consolidation throughout the right lung parenchyma. Findings concerning for atypical pneumonia or aspiration pneumonia. Electronically Signed   By: Fidela Salisbury M.D.   On: 02/05/2021 12:33       Medical Problem List and Plan: 1.  Decreased functional mobility with dysphagia/altered mental status secondary to traumatic ICH with small right parietal SDH  -patient may *** shower  -ELOS/Goals: *** 2.  Antithrombotics: -DVT/anticoagulation: SCDs  -antiplatelet therapy: N/A 3. Pain Management: Tylenol as needed 4. Mood: Provide emotional support  -antipsychotic agents: N/A 5. Neuropsych: This patient is capable of making decisions on his own behalf. 6. Skin/Wound Care: Routine skin checks 7. Fluids/Electrolytes/Nutrition: Routine in and outs with follow-up chemistries 8.  Chronic dysphagia with aspiration pneumonia.  Dysphagia #1 nectar thick liquids.  Unasyn initiated 02/06/2021.  Follow-up speech therapy 9.  History of parotid cancer 1988.  Status post surgical resection and radiation therapy.  Follow-up outpatient 10.  Reported history of PAF.  Continue Coreg 3.125 mg twice daily.  Cardiology service signed off 01/28/2021 and follow-up outpatient.  Currently not a candidate for anticoagulation  11.  Glaucoma.  Continue eyedrops as directed 12.  Urinary retention.  Foley catheter tube.  Continue Flomax.  Follow-up outpatient urology services.  Consider voiding trial  ***  Cathlyn [place], PA-C 02/07/2021

## 2021-02-07 NOTE — Progress Notes (Signed)
SLP Cancellation Note  Patient Details Name: Ryan Lynn MRN: [id number] DOB: 1934/01/14   Pt has decided to transition to comfort care.  Spoke with [name] outside the room while he was getting bathed; offered support and encouragement.  Our service will respectfully sign off.  Kieon Lawhorn L. Tivis Ringer, [place] CCC/SLP Acute Rehabilitation Services Office number [phone] Pager [phone]           [name] 02/07/2021, 10:59 AM

## 2021-02-07 NOTE — Progress Notes (Signed)
   02/07/21 1408  Clinical Encounter Type  Visited With Patient and family together  Visit Type Initial  Referral From Nurse  Consult/Referral To Chaplain  Spiritual Encounters  Spiritual Needs Prayer   Chaplain responded to referral. Pt's wife, [name], and daughter, Neoma Laming, were at bedside. Chaplain engaged in active listening and emotional support. Pt's wife, [name], shared about her and Pt's faith, and how they met. Chaplain prayed with Pt and family. Chaplain remains available.  This note was prepared by Chaplain Resident, Dante Gang, MDiv. Chaplain remains available as needed through the on-call pager: [phone].

## 2021-02-08 NOTE — Progress Notes (Signed)
Pt discharge to [facility] place and report called off to nurse Arbie Cookey at the facility. Receiving nurse requested to leave pt IV in place. Pt to be discharge to facility by PT[facility] with IV intact and belongings to the side. Pt awaiting on [facility] for transportation to disposition. Delia Heady RN

## 2021-02-08 NOTE — TOC Transition Note (Signed)
Transition of Care [facility]) - CM/SW Discharge Note   Patient Details  Name: Ryan Lynn MRN: [id number] Date of Birth: 03-Jul-1934  Transition of [facility]) CM/SW Contact:  Marney Setting, [place] Work Phone Number: 02/08/2021, 3:31 PM   Clinical Narrative:   Nurse to call report to [phone]    Final next level of care: [place] Barriers to Discharge: Barriers Resolved   Patient Goals and CMS Choice Patient states their goals for this hospitalization and ongoing recovery are:: to keep comfortable CMS Medicare.gov Compare Post Acute Care list provided to:: Patient Represenative (must comment) Choice offered to / list presented to : Spouse  Discharge Placement              Patient chooses bed at:  [facility]) Patient to be transferred to facility by: [place] Name of family member notified: [name] Patient and family notified of of transfer: 02/08/21  Discharge Plan and Services     Post Acute Care Choice: Hospice                               Social Determinants of Health (SDOH) Interventions     Readmission Risk Interventions No flowsheet data found.

## 2021-02-08 NOTE — Progress Notes (Signed)
Inpatient Rehab Admissions Coordinator:    Pt. Being d/c'd to hospice. CIR will sign off. Please contact me with any questions.   Clemens Catholic, [place], [employer] Admissions Coordinator  [phone] (Ali Chuk) [phone] (office)

## 2021-02-08 NOTE — Discharge Summary (Signed)
[facility] Discharge Summary   Patient: Ryan Lynn:[id number]  PCP: Shon Baton, MD  Date of admission: 01/25/2021   Date of discharge:  02/08/2021     Discharge Diagnoses:  Principal diagnosis SDH and SAH     Atrial fibrillation with rapid ventricular response (HCC)   Scalp laceration   Fall   Protein-calorie malnutrition, severe   Admitted From: home Disposition:   Residential hospice  Recommendations for Outpatient Follow-up:  1. PCP: establish care with hospice 2. Follow up LABS/TEST:  none   Follow-up Information    Buford Dresser, MD Follow up.   Specialty: Cardiology Why: [facility] - cardiology office will call you to arrange follow-up. Contact information: [address] [place] [address] [phone]              Discharge Instructions    Discharge wound care:   Complete by: As directed    Foam dressing PRN   Increase activity slowly   Complete by: As directed    May have comfort feeds from floor stock   Complete by: As directed       Diet recommendation: comfort feeds  Activity: The patient is advised to gradually reintroduce usual activities, as tolerated  Discharge Condition: stable  Code Status: DNR   History of present illness: As per the H and P dictated on admission, "Ryan Lynn is a 87 y.o. male who has a PMH including but not limited to glaucoma (see "past medical history" for rest).  He presented to [facility] ED 1/26 after he fell at home down a flight of stairs.  He doesn't recall the fall or what happened before.  Wife heard the fall and went to him and found him to be conscious.    CT demonstrated ICH with small right SDH.  While in ED, he had a few PVC's and a brief run of NSVT.  PCCM subsequently asked to admit. Neurosurgery to see in consultation.  He did have episode in 2021 where he passed out and suffered small ICH.  He did not require admission and was to follow up with neurology as an  outpatient."  Hospital Course:  87 yo male fell down stairs at home and brought to ER on 01/25/21. Found to have [place], Syncope, SDH, A fib, Parotic cancer s/p XRT, GERD, ED, Glaucoma bilateral, Aspiration pneumonia complicated by C diff in 2016.  CT head 1/26 >ICH with small right parietal SDH with mild mass effect, no MLS. Mild SAH of medial right temporal lobe.  Echo 1/27 >EF 45%, grade 1 DD, RVSP 34.1 mmHg, aortic root 41 mm  Carotid duplex 1/27 >Right Carotid: Velocities in the right ICA are consistent with a 1-39% stenosis. Left Carotid: Velocities in the left ICA are consistent with a 1-39% stenosis. Vertebrals: Right vertebral artery demonstrates antegrade flow. Left vertebral artery was not visualized.   CT head 1/27 >> no change in Rt posterior SDH  Summary of his active problems in the hospital is as following.   Traumatic SDH and SAH secondary to syncopal event Evaluated by neurosurgery at intake Imaging studies stable  No further intervention or imaging unless otherwise specified by neurosurgery 7 days of Keppra per neurosurgery recommendation  Completed 02/02/21 Would likely benefit from outpt follow up with neurology (previously seen by Dr. Antony Contras). Currently comfort care.  Syncopal event with new onset systolic CHF, POA PAC, PVCs. Aortic root dilation. Reported hx of PAF. Hypertension. Unclear etiology, continue to follow clinically  Continue carvedilol  Frequent PVCs noted on telemetry, consistent with his baseline. Cardiology previously following given new reduced EF at 45%  no further recommendations, signed off as of January 28, 2021. Not a candidate for long term anticoagulation.   Acute on chronic dysphagia in setting of prior hx of parotid cancer s/p XRT. Severe protein caloric malnutrition and cachexia. Aspiration pneumonia Patient has chronic aspiration ongoing for 20+ years follows up with ENT on a regular basis. Speech therapy was  consulted.  MBS was performed.  Patient at risk for aspiration regardless of the consistency of liquids and diet thickness. Family and patient desired to continue oral diet. Chest x-ray on 2/7 showed left lower lobe pneumonia.  CT chest confirmed loculated pleural effusion along with left lower lobe pneumonia and food debris's in trachea. Discussed with family regarding plan of care and goals of care and they want to focus on comfort. Informed that the patient not a good candidate for feeding tube given ongoing risk for aspiration.  Glaucoma. continue eye drops. Dorzolamide 3 times daily, latanoprost nightly.  Urinary outlet obstruction, unspecified Patient finally agreeable to foley catheter  Continue Foley catheter.  Deconditioning, chronic Due to recurrent pneumonia and worsening fatigue and tiredness not a good candidate for rehab.  Transitioning to comfort care.  Goals of care conversation. Patient continues to complain of severe cough as well as complain of fatigue and tiredness and shortness of breath. Remains ongoing risk for aspiration. Unable to tolerate any consistency without aspiration. Not sure how well he will tolerate thoracentesis for the loculated pleural effusion. Currently being admitted with syncope and subdural hematoma and subarachnoid hematoma treated conservatively. Also has prior history of malignancy treated with radiotherapy and currently has failure to thrive with severe protein calorie malnutrition. Patient is currently DNR. I informed wife that patient may not be able to stable enough to leave the hospital and even in the best case scenario will have recurrent hospitalization going forward given his severe deconditioning and worsening dysphagia. Recommend wife to consider comfort care for the patient.  2/8.  Discussion with patient and wife in detail regarding aspiration pneumonia, loculated effusion as well as ongoing fatigue and weakness and presentation  with SDH. Patient preferred to transition to comfort and hospice. [facility] consulted. Patient with minimal oral intake due to severe aspiration, worsening hypoxia in the setting of pneumonia and loculated pleural effusion with SDH.  Will be a good candidate to residential hospice with prognosis less than 4 weeks.  On the day of the discharge the patient's vitals were stable, and no other acute medical condition were reported by patient. The patient was felt safe to be discharge at hospice.  Consultants: PCCM primary admission Neurosurgery  Cardiology  Neurology  Procedures: none  DISCHARGE MEDICATION: Allergies as of 02/08/2021   No Known Allergies     Medication List    STOP taking these medications   cholecalciferol 25 MCG (1000 UNIT) tablet Commonly known as: VITAMIN D3   omeprazole 20 MG capsule Commonly known as: PRILOSEC     TAKE these medications   dorzolamide 2 % ophthalmic solution Commonly known as: TRUSOPT 1 drop 3 (three) times daily.   latanoprost 0.005 % ophthalmic solution Commonly known as: XALATAN 1 drop at bedtime.            Discharge Care Instructions  (From admission, onward)         Start     Ordered   02/08/21 0000  Discharge wound care:  Comments: Foam dressing PRN   02/08/21 1213          Discharge Exam: Filed Weights   01/25/21 2340 01/26/21 0640 02/02/21 0500  Weight: 69.4 kg 61.1 kg 68.7 kg   Vitals:   02/07/21 2352 02/08/21 0744  BP: 133/78 (!) 144/64  Pulse: 95 71  Resp: 17 (!) 22  Temp: 98.4 F (36.9 C) 98.2 F (36.8 C)  SpO2: 91% 95%   General: Appear in mild distress, no Rash; Oral Mucosa dry with dry blood and food particles. no Abnormal Neck Mass Or lumps, Conjunctiva normal  Cardiovascular: S1 and S2 Present, no Murmur, Respiratory: increased respiratory effort, Bilateral Air entry present and bilateral  Crackles, Occasional wheezes Abdomen: Bowel Sound present, Soft and no tenderness Extremities:  trace Pedal edema Neurology: alert and oriented to time, place, and person affect appropriate. no new focal deficit Gait not checked due to patient safety concerns  The results of significant diagnostics from this hospitalization (including imaging, microbiology, ancillary and laboratory) are listed below for reference.    Significant Diagnostic Studies: CT HEAD WO CONTRAST  Result Date: 01/26/2021 CLINICAL DATA:  Subdural hematoma follow-up EXAM: CT HEAD WITHOUT CONTRAST TECHNIQUE: Contiguous axial images were obtained from the base of the skull through the vertex without intravenous contrast. COMPARISON:  01/26/2021 at 12:04 a.m. FINDINGS: Brain: Unchanged right posterior subdural hematoma measuring 7 mm in thickness, allowing for redistribution. There is no midline shift or other mass effect. No new site of hemorrhage. Vascular: No abnormal hyperdensity of the major intracranial arteries or dural venous sinuses. No intracranial atherosclerosis. Skull: The visualized skull base, calvarium and extracranial soft tissues are normal. Sinuses/Orbits: No fluid levels or advanced mucosal thickening of the visualized paranasal sinuses. No mastoid or middle ear effusion. The orbits are normal. IMPRESSION: Unchanged size of right posterior subdural hematoma, allowing for redistribution. No mass effect. Electronically Signed   By: Ulyses Jarred M.D.   On: 01/26/2021 20:50   CT HEAD WO CONTRAST  Result Date: 01/26/2021 CLINICAL DATA:  Fall, head injury, neck injury, chronic anticoagulation EXAM: CT HEAD WITHOUT CONTRAST CT CERVICAL SPINE WITHOUT CONTRAST TECHNIQUE: Multidetector CT imaging of the head and cervical spine was performed following the standard protocol without intravenous contrast. Multiplanar CT image reconstructions of the cervical spine were also generated. COMPARISON:  MRI head 09/17/2020, CT neck 04/01/2017 FINDINGS: CT HEAD FINDINGS Brain: There is normal anatomic configuration of the brain. A  small right subdural hematoma is seen along the right parietal convexity measuring 8-9 mm in thickness on coronal image # 47 and demonstrating mild mass effect upon the right cerebral hemisphere. There is no associated midline shift. Additionally, there is a small focus of subarachnoid hemorrhage seen within the sulcus of the hippocampal gyrus, best noted on coronal image # 43. No acute infarct. Cerebellum unremarkable. Ventricular size is normal. No abnormal intra or extra-axial mass lesion. Previously noted hyperdensity within the pons and inferior left temporal lobe have resolved in the interval since the prior examination in keeping with resolved hemorrhage. Vascular: No asymmetric hyperdense vasculature at the skull base. Skull: The calvarium is intact Sinuses/Orbits: Mild mucosal thickening within the a left maxillary sinus and frontal sinuses with opacification of several left ethmoid air cells. The orbits are unremarkable Other: Small right occipital scalp hematoma with punctate focus of subcutaneous gas in keeping with direct trauma. There is fluid opacification of several inferior left mastoid air cells with coalescence of the air cells in keeping with changes of chronic mastoiditis.  Middle ear cavities and right mastoid air cells are clear. CT CERVICAL SPINE FINDINGS Alignment: There is reversal of the normal cervical lordosis from C2-C6 likely degenerative in nature. No listhesis. Skull base and vertebrae: The craniocervical junction is unremarkable. The atlantodental interval is normal. No acute fracture of the cervical spine. Soft tissues and spinal canal: Posterior disc osteophyte complex at C4-5 results in moderate to severe central canal stenosis with an AP diameter of the spinal canal of 6-7 mm and resultant flattening of the thecal sac. Posterior disc osteophyte complex ease at C3-4 and C5-6 results in mild central canal stenosis. No canal hematoma. No prevertebral soft tissue swelling. No  paraspinal fluid collections are identified. Disc levels: Review of the sagittal images demonstrates intervertebral disc space narrowing and endplate remodeling throughout the cervical spine, most severe at C4-C6 in keeping with changes of severe degenerative disc disease. Vertebral body height has been preserved. Review of the axial images demonstrates multilevel uncovertebral and facet arthrosis resulting in multilevel moderate to severe neural foraminal narrowing, most severe on the right at C2-3, C3-4, C4-5, and C5-6 and on the left at C5-6. Upper chest: Biapical scarring and left apical calcified pleural plaques are identified. Other: Thyroid unremarkable IMPRESSION: Acute intracranial hemorrhage with small right parietal subdural hematoma demonstrating mild mass effect upon the right cerebral hemisphere. No midline shift. Additionally, mild subarachnoid hemorrhage is seen involving the medial right temporal lobe. Previously noted foci of hyperattenuation involving the pons and left temporal lobe have resolved in keeping with hemorrhage at that time. Fluid opacification of the inferior left mastoid air cells with coalescence of the air cells in keeping with chronic left mastoiditis. Mild paranasal sinus disease. No acute fracture or listhesis of the cervical spine. Degenerative disc and degenerative joint disease resulting in moderate to severe central canal stenosis at C4-5 and multilevel neural foraminal narrowing as described above. These results were called by telephone at the time of interpretation on 01/26/2021 at 12:36 am to provider [facility] , who verbally acknowledged these results. Electronically Signed   By: Fidela Salisbury MD   On: 01/26/2021 00:39   CT CHEST W CONTRAST  Addendum Date: 02/06/2021   ADDENDUM REPORT: 02/06/2021 19:06 ADDENDUM: Ascending thoracic aortic dilatation to 4.3 cm. Recommend annual imaging followup by CTA or MRA. This recommendation follows 2010  ACCF/AHA/AATS/ACR/ASA/SCA/SCAI/SIR/STS/SVM Guidelines for the Diagnosis and Management of Patients with Thoracic Aortic Disease. Circulation. 2010; 121: C588-F027. Aortic aneurysm NOS (ICD10-I71.9) These results will be called to the ordering clinician or representative by the Radiologist Assistant, and communication documented in the PACS or [employer]. Electronically Signed   By: Lovena Le M.D.   On: 02/06/2021 19:06   Result Date: 02/06/2021 CLINICAL DATA:  Pneumonia EXAM: CT CHEST WITH CONTRAST TECHNIQUE: Multidetector CT imaging of the chest was performed during intravenous contrast administration. CONTRAST:  61mL OMNIPAQUE IOHEXOL 300 MG/ML  SOLN COMPARISON:  CT 06/21/2015 PET-CT 02/09/2015 FINDINGS: Cardiovascular: Cardiac size is top normal without pericardial effusion. Few scant coronary artery calcifications. Dilatation of the ascending thoracic aorta to 4.3 cm, similar in caliber to 2016 comparison. Atherosclerotic plaque throughout the thoracic aorta. Normal 3 vessel branching of the aortic arch. Proximal great vessels are unremarkable. Central pulmonary arteries are normal caliber. No large central or lobar pulmonary arterial filling defects are identified. No major venous abnormalities. Mediastinum/Nodes: No free mediastinal fluid or gas. Extensive secretions in the trachea and central airways, particularly the left mainstem bronchus and airways of the lower lobes. No acute abnormality of  the esophagus. Thyroid gland and thoracic inlet are unremarkable. Numerous low-attenuation mediastinal and hilar lymph nodes are present, largest including: 13 mm AP window lymph node (3/60) 13 mm left paratracheal node (3/58) 10 mm left hilar node (3/78) 15 mm right sub hilar lymph node (3/66). Lungs/Pleura: Moderate bilateral effusions partially tracking within the fissures. Slightly lobular contours of the effusion on the left may suggest some partial loculation. There are mixed patchy consolidative and  ground-glass opacities in the dependent portions of the lungs most prominently in both upper lobes and superior segment left lower lobe. Some additional basilar opacities likely reflecting passive atelectatic change. Diffuse airways thickening and scattered secretions, particularly involving the fluid-filled airways of the left lung with more minimal thickening and secretions in the right lung airways. Scattered pleural calcifications noted towards the apices with additional punctate calcified granulomata seen elsewhere in the lungs brain since within the atelectatic left lower lobe (3/115). No pneumothorax. Some additional bandlike opacities seen throughout the lungs likely reflecting regions of scarring related to prior infection or inflammation. Upper Abdomen: Multiple calcified gallstones present within the otherwise normal gallbladder. Fluid attenuation cyst in the left lobe liver measures 2.1 cm. Musculoskeletal: The osseous structures appear diffusely demineralized which may limit detection of small or nondisplaced fractures. Degenerative changes are present in the imaged spine and shoulders. Dextrocurvature of the midthoracic spine is similar to priors. No acute osseous abnormality or suspicious osseous lesion. Moderate bilateral gynecomastia. Circumferential body wall edema. IMPRESSION: 1. Extensive secretions in the distal trachea and central airways, particularly the left mainstem bronchus and airways of the left lung with more minimal thickening and secretions in the right lung airways. Findings are compatible with aspiration. Additional mixed patchy consolidative and ground-glass opacities in the dependent portions of the lungs most prominently in both upper lobes and superior segment left lower lobe, could reflect further sequela of aspiration or an underlying multifocal infection/pneumonia. 2. Moderate bilateral effusions partially tracking within the fissures. Suspect at least partial loculation on  the left. 3. Numerous low-attenuation mediastinal and hilar lymph nodes, likely reactive. 4. Cholelithiasis without evidence of acute cholecystitis. 5. Moderate bilateral gynecomastia. 6. Aortic Atherosclerosis (ICD10-I70.0). Electronically Signed: By: Lovena Le M.D. On: 02/06/2021 18:50   CT CERVICAL SPINE WO CONTRAST  Result Date: 01/26/2021 CLINICAL DATA:  Fall, head injury, neck injury, chronic anticoagulation EXAM: CT HEAD WITHOUT CONTRAST CT CERVICAL SPINE WITHOUT CONTRAST TECHNIQUE: Multidetector CT imaging of the head and cervical spine was performed following the standard protocol without intravenous contrast. Multiplanar CT image reconstructions of the cervical spine were also generated. COMPARISON:  MRI head 09/17/2020, CT neck 04/01/2017 FINDINGS: CT HEAD FINDINGS Brain: There is normal anatomic configuration of the brain. A small right subdural hematoma is seen along the right parietal convexity measuring 8-9 mm in thickness on coronal image # 47 and demonstrating mild mass effect upon the right cerebral hemisphere. There is no associated midline shift. Additionally, there is a small focus of subarachnoid hemorrhage seen within the sulcus of the hippocampal gyrus, best noted on coronal image # 43. No acute infarct. Cerebellum unremarkable. Ventricular size is normal. No abnormal intra or extra-axial mass lesion. Previously noted hyperdensity within the pons and inferior left temporal lobe have resolved in the interval since the prior examination in keeping with resolved hemorrhage. Vascular: No asymmetric hyperdense vasculature at the skull base. Skull: The calvarium is intact Sinuses/Orbits: Mild mucosal thickening within the a left maxillary sinus and frontal sinuses with opacification of several left ethmoid air cells.  The orbits are unremarkable Other: Small right occipital scalp hematoma with punctate focus of subcutaneous gas in keeping with direct trauma. There is fluid opacification of  several inferior left mastoid air cells with coalescence of the air cells in keeping with changes of chronic mastoiditis. Middle ear cavities and right mastoid air cells are clear. CT CERVICAL SPINE FINDINGS Alignment: There is reversal of the normal cervical lordosis from C2-C6 likely degenerative in nature. No listhesis. Skull base and vertebrae: The craniocervical junction is unremarkable. The atlantodental interval is normal. No acute fracture of the cervical spine. Soft tissues and spinal canal: Posterior disc osteophyte complex at C4-5 results in moderate to severe central canal stenosis with an AP diameter of the spinal canal of 6-7 mm and resultant flattening of the thecal sac. Posterior disc osteophyte complex ease at C3-4 and C5-6 results in mild central canal stenosis. No canal hematoma. No prevertebral soft tissue swelling. No paraspinal fluid collections are identified. Disc levels: Review of the sagittal images demonstrates intervertebral disc space narrowing and endplate remodeling throughout the cervical spine, most severe at C4-C6 in keeping with changes of severe degenerative disc disease. Vertebral body height has been preserved. Review of the axial images demonstrates multilevel uncovertebral and facet arthrosis resulting in multilevel moderate to severe neural foraminal narrowing, most severe on the right at C2-3, C3-4, C4-5, and C5-6 and on the left at C5-6. Upper chest: Biapical scarring and left apical calcified pleural plaques are identified. Other: Thyroid unremarkable IMPRESSION: Acute intracranial hemorrhage with small right parietal subdural hematoma demonstrating mild mass effect upon the right cerebral hemisphere. No midline shift. Additionally, mild subarachnoid hemorrhage is seen involving the medial right temporal lobe. Previously noted foci of hyperattenuation involving the pons and left temporal lobe have resolved in keeping with hemorrhage at that time. Fluid opacification of the  inferior left mastoid air cells with coalescence of the air cells in keeping with chronic left mastoiditis. Mild paranasal sinus disease. No acute fracture or listhesis of the cervical spine. Degenerative disc and degenerative joint disease resulting in moderate to severe central canal stenosis at C4-5 and multilevel neural foraminal narrowing as described above. These results were called by telephone at the time of interpretation on 01/26/2021 at 12:36 am to provider [facility] , who verbally acknowledged these results. Electronically Signed   By: Fidela Salisbury MD   On: 01/26/2021 00:39   DG Pelvis Portable  Result Date: 01/25/2021 CLINICAL DATA:  Fall EXAM: PORTABLE PELVIS 1-2 VIEWS COMPARISON:  None. FINDINGS: SI joints are non widened. Pubic symphysis and rami appear intact. No acute displaced fracture or malalignment. IMPRESSION: Negative. Electronically Signed   By: Donavan Foil M.D.   On: 01/25/2021 23:52   DG CHEST PORT 1 VIEW  Result Date: 02/05/2021 CLINICAL DATA:  Shortness of breath. EXAM: PORTABLE CHEST 1 VIEW COMPARISON:  January 25, 2021 FINDINGS: Calcific atherosclerotic disease and tortuosity of the aorta. Mildly enlarged cardiac silhouette. Interval development of left pleural effusion. Patchy airspace consolidation at the left lower lung field, and more subtle airspace consolidation throughout the right lung parenchyma. Osseous structures are without acute abnormality. Soft tissues are grossly normal. IMPRESSION: Interval development of left pleural effusion with patchy airspace consolidation at the left lower lung field and more subtle airspace consolidation throughout the right lung parenchyma. Findings concerning for atypical pneumonia or aspiration pneumonia. Electronically Signed   By: Fidela Salisbury M.D.   On: 02/05/2021 12:33   DG Chest Port 1 View  Result Date: 01/25/2021 CLINICAL  DATA:  Fall EXAM: PORTABLE CHEST 1 VIEW COMPARISON:  09/17/2020 FINDINGS: Hyperinflated  lungs. No acute consolidation or effusion. Normal cardiac size. Slight asymmetric irregular opacity in the right hilus. No pneumothorax. IMPRESSION: No acute infiltrate or edema. Slight asymmetric irregular opacity in the right hilus, uncertain if this is due to summation artifact and rotation versus hilar abnormality. Correlation with chest CT could be considered. Electronically Signed   By: Donavan Foil M.D.   On: 01/25/2021 23:54   DG Swallowing Func-Speech Pathology  Result Date: 02/02/2021 Objective Swallowing Evaluation: Type of Study: MBS-Modified Barium Swallow Study  Patient Details Name: Ryan Lynn MRN: [id number] Date of Birth: [birth year] Today's Date: 02/02/2021 Time: SLP Start Time (ACUTE ONLY): 0830 -SLP Stop Time (ACUTE ONLY): 0910 SLP Time Calculation (min) (ACUTE ONLY): 40 min Past Medical History: No past medical history on file. Past Surgical History: No past surgical history on file. HPI: Pt is an 87 y.o. 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 He presented to [facility] ED 1/26 after he fell at home down a flight of stairs.  CT head 1/27: Acute intracranial hemorrhage with small right parietal subdural hematoma demonstrating mild mass effect upon the right cerebral hemisphere. CXR 1/26: No acute infiltrate or edema. 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 Pt's wife reported that the had swallow studies in the past and provided a sheet with instructions which he has given. Per the wife, pt has vocal fold atrophy and must use chin tuck, effortful swallow, and drink from a straw. Pill sizes that are larger than the size of an aspirin are crushed at home. Liquids must be thickened to "mild to moderate" thickness and he consumed "soft and moist" foods. Pt's wife stated noodles, potatoes, pasta, etc, but stated that the pt cannot control soup. Pt's wife showed SLP the amount of thickener that she typically uses and  it appears that he is on nectar thick liquids. CT cervical spine on admission revealed severe degenerative disc disease, particularly at C4-6. Pt's most recent swallow study was 09/25/19, at which time deteriorating swallow was revealed with intermittent aspiration and signficant pharyngeal residue.  Pt participated in OP SLP for dysphagia tx October-December of 2020 (these records are available under a different MRN).  No data recorded Assessment / Plan / Recommendation CHL IP CLINICAL IMPRESSIONS 02/02/2021 Clinical Impression In comparison with MBS in 2020, severity of pts dysphagia has become even more profoundly impaired, likely with ongoing chronic decline and with new acute impairment given increased weakness and functional reserve after a fall. Pt demonstrates severe oral impairment. He cannot hold his head up straight and anterior tilt exacerbates reduced labial seal and decreased ability to lift up tongue to hard palate for lingual striping and bolus propulsion. He could not achieve negative oral pressure for a cup or straw sip and required spoon feeding which is not his baseline. Bolus transit was more of posterior spillage and required a larger bolus or pt could not even get the bolus to the vallecule. There was absent velopharyngeal elevation, and minimal hyoid excursion and epiglottic deflection. Though pts pharynx is distended due to curvature of his spine he did manage to achieve some pharyngeal peristalsis to transit 10-15% of boluses through UES during multiple swallows. THis improved with a head turn left. Oropharyngeal residue was still profound and pt has decreased sensation of this. Following bites of puree with teaspoons of honey thick liquids did aid in pharyngeal transit  though pt does aspirate the mixed residue post swallow with intermittent sensation and no significant airway clearance. Overall, the pts risk of an aspiration based pna is high, his risk of asphyxiation with food is high and  his risk of poor nutritional intake is high.  See recommendations below. Given an acute component of dysphagia, pt would greatly benefit from rehabilitation as there are ways to increase cough strength, awareness of oral hygiene and mobility to combat pts risk. Will f/u for discussion with family and MD regarding these topics. SLP Visit Diagnosis Dysphagia, pharyngeal phase (R13.13) Attention and concentration deficit following -- Frontal lobe and executive function deficit following -- Impact on safety and function Severe aspiration risk;Risk for inadequate nutrition/hydration   CHL IP TREATMENT RECOMMENDATION 02/02/2021 Treatment Recommendations Therapy as outlined in treatment plan below   Prognosis 01/27/2021 Prognosis for Safe Diet Advancement Guarded Barriers to Reach Goals Severity of deficits;Time post onset Barriers/Prognosis Comment -- CHL IP DIET RECOMMENDATION 02/02/2021 SLP Diet Recommendations Honey thick liquids;Other (Comment) Liquid Administration via Spoon Medication Administration Crushed with puree Compensations Slow rate;Small sips/bites;Multiple dry swallows after each bite/sip;Follow solids with liquid Postural Changes --   CHL IP OTHER RECOMMENDATIONS 02/02/2021 Recommended Consults -- Oral Care Recommendations Oral care QID Other Recommendations Order thickener from pharmacy   CHL IP FOLLOW UP RECOMMENDATIONS 02/02/2021 Follow up Recommendations Inpatient Rehab   CHL IP FREQUENCY AND DURATION 02/02/2021 Speech Therapy Frequency (ACUTE ONLY) min 2x/week Treatment Duration 2 weeks      CHL IP ORAL PHASE 02/02/2021 Oral Phase Impaired Oral - Pudding Teaspoon -- Oral - Pudding Cup -- Oral - Honey Teaspoon Decreased bolus cohesion;Delayed oral transit;Lingual/palatal residue;Reduced posterior propulsion;Incomplete tongue to palate contact;Weak lingual manipulation;Left anterior bolus loss Oral - Honey Cup -- Oral - Nectar Teaspoon Decreased bolus cohesion;Delayed oral transit;Lingual/palatal residue;Reduced  posterior propulsion;Incomplete tongue to palate contact;Weak lingual manipulation;Left anterior bolus loss Oral - Nectar Cup Reduced posterior propulsion;Decreased bolus cohesion Oral - Nectar Straw (No Data) Oral - Thin Teaspoon -- Oral - Thin Cup -- Oral - Thin Straw -- Oral - Puree Decreased bolus cohesion;Delayed oral transit;Lingual/palatal residue;Reduced posterior propulsion;Incomplete tongue to palate contact;Weak lingual manipulation;Left anterior bolus loss Oral - Mech Soft -- Oral - Regular -- Oral - Multi-Consistency -- Oral - Pill -- Oral Phase - Comment --  CHL IP PHARYNGEAL PHASE 02/02/2021 Pharyngeal Phase Impaired Pharyngeal- Pudding Teaspoon -- Pharyngeal -- Pharyngeal- Pudding Cup -- Pharyngeal -- Pharyngeal- Honey Teaspoon Reduced pharyngeal peristalsis;Reduced epiglottic inversion;Reduced anterior laryngeal mobility;Reduced tongue base retraction;Penetration/Apiration after swallow;Moderate aspiration;Pharyngeal residue - valleculae;Pharyngeal residue - pyriform Pharyngeal Material enters airway, passes BELOW cords and not ejected out despite cough attempt by patient;Material enters airway, CONTACTS cords and not ejected out;Material enters airway, passes BELOW cords without attempt by patient to eject out (silent aspiration);Material does not enter airway Pharyngeal- Honey Cup -- Pharyngeal -- Pharyngeal- Nectar Teaspoon Reduced pharyngeal peristalsis;Reduced epiglottic inversion;Reduced anterior laryngeal mobility;Reduced tongue base retraction;Penetration/Apiration after swallow;Moderate aspiration;Pharyngeal residue - valleculae;Pharyngeal residue - pyriform Pharyngeal Material enters airway, passes BELOW cords without attempt by patient to eject out (silent aspiration);Material enters airway, passes BELOW cords and not ejected out despite cough attempt by patient;Material enters airway, CONTACTS cords and not ejected out;Material does not enter airway Pharyngeal- Nectar Cup -- Pharyngeal --  Pharyngeal- Nectar Straw -- Pharyngeal -- Pharyngeal- Thin Teaspoon -- Pharyngeal -- Pharyngeal- Thin Cup -- Pharyngeal -- Pharyngeal- Thin Straw -- Pharyngeal -- Pharyngeal- Puree Reduced pharyngeal peristalsis;Reduced epiglottic inversion;Reduced anterior laryngeal mobility;Reduced tongue base retraction;Pharyngeal residue - valleculae;Pharyngeal residue -  pyriform Pharyngeal -- Pharyngeal- Mechanical Soft -- Pharyngeal -- Pharyngeal- Regular -- Pharyngeal -- Pharyngeal- Multi-consistency -- Pharyngeal -- Pharyngeal- Pill -- Pharyngeal -- Pharyngeal Comment --  No flowsheet data found. [name], MA CCC-SLP Acute Rehabilitation Services Pager [phone] Office [phone] Lynann Beaver 02/02/2021, 10:49 AM              ECHOCARDIOGRAM COMPLETE  Result Date: 01/26/2021    ECHOCARDIOGRAM REPORT   Patient Name:   Ryan Lynn Date of Exam: 01/26/2021 Medical Rec #:  [id number]    Height:       72.0 in Accession #:    [id number]   Weight:       134.7 lb Date of Birth:  02-Sep-1934     BSA:          1.801 m Patient Age:    53 years     BP:           128/73 mmHg Patient Gender: M            HR:           93 bpm. Exam Location:  Inpatient Procedure: 2D Echo, Color Doppler and Cardiac Doppler Indications:    Premature ventricular contraction  History:        Patient has no prior history of Echocardiogram examinations.                 Arrythmias:Atrial Fibrillation; Signs/Symptoms:Syncope.  Sonographer:    Clayton Lefort RDCS (AE) Referring Phys: [id number] [facility]  Sonographer Comments: Suboptimal parasternal window. IMPRESSIONS  1. Left ventricular ejection fraction, by estimation, is 45%. The left ventricle has mildly decreased function. The left ventricle demonstrates global hypokinesis. Left ventricular diastolic parameters are consistent with Grade I diastolic dysfunction (impaired relaxation).  2. Right ventricular systolic function is normal. The right ventricular size is normal. There is normal  pulmonary artery systolic pressure. The estimated right ventricular systolic pressure is 41.6 mmHg.  3. The mitral valve is normal in structure. No evidence of mitral valve regurgitation. No evidence of mitral stenosis.  4. The aortic valve is tricuspid. Aortic valve regurgitation is not visualized. Mild aortic valve sclerosis is present, with no evidence of aortic valve stenosis.  5. Aortic dilatation noted. There is mild dilatation of the aortic root, measuring 41 mm.  6. The inferior vena cava is normal in size with greater than 50% respiratory variability, suggesting right atrial pressure of 3 mmHg. FINDINGS  Left Ventricle: Left ventricular ejection fraction, by estimation, is 45%. The left ventricle has mildly decreased function. The left ventricle demonstrates global hypokinesis. The left ventricular internal cavity size was normal in size. There is no left ventricular hypertrophy. Left ventricular diastolic parameters are consistent with Grade I diastolic dysfunction (impaired relaxation). Right Ventricle: The right ventricular size is normal. No increase in right ventricular wall thickness. Right ventricular systolic function is normal. There is normal pulmonary artery systolic pressure. The tricuspid regurgitant velocity is 2.79 m/s, and  with an assumed right atrial pressure of 3 mmHg, the estimated right ventricular systolic pressure is 60.6 mmHg. Left Atrium: Left atrial size was normal in size. Right Atrium: Right atrial size was normal in size. Pericardium: There is no evidence of pericardial effusion. Mitral Valve: The mitral valve is normal in structure. There is mild calcification of the mitral valve leaflet(s). No evidence of mitral valve regurgitation. No evidence of mitral valve stenosis. Tricuspid Valve: The tricuspid valve is normal in structure. Tricuspid valve regurgitation is trivial.  Aortic Valve: The aortic valve is tricuspid. Aortic valve regurgitation is not visualized. Mild aortic  valve sclerosis is present, with no evidence of aortic valve stenosis. Aortic valve mean gradient measures 2.0 mmHg. Aortic valve peak gradient measures 3.2 mmHg. Aortic valve area, by VTI measures 2.60 cm. Pulmonic Valve: The pulmonic valve was normal in structure. Pulmonic valve regurgitation is not visualized. Aorta: Aortic dilatation noted. There is mild dilatation of the aortic root, measuring 41 mm. Venous: The inferior vena cava is normal in size with greater than 50% respiratory variability, suggesting right atrial pressure of 3 mmHg. IAS/Shunts: No atrial level shunt detected by color flow Doppler.  LEFT VENTRICLE PLAX 2D LVIDd:         4.70 cm LVIDs:         3.80 cm LV PW:         0.90 cm LV IVS:        0.90 cm LVOT diam:     2.00 cm     3D Volume EF: LV SV:         44          3D EF:        53 % LV SV Index:   24          LV EDV:       127 ml LVOT Area:     3.14 cm    LV ESV:       60 ml                            LV SV:        67 ml  LV Volumes (MOD) LV vol d, MOD A2C: 91.9 ml LV vol d, MOD A4C: 97.3 ml LV vol s, MOD A2C: 48.9 ml LV vol s, MOD A4C: 50.2 ml LV SV MOD A2C:     43.0 ml LV SV MOD A4C:     97.3 ml LV SV MOD BP:      43.4 ml RIGHT VENTRICLE             IVC RV Basal diam:  3.30 cm     IVC diam: 1.40 cm RV S prime:     11.10 cm/s TAPSE (M-mode): 3.0 cm LEFT ATRIUM           Index       RIGHT ATRIUM           Index LA diam:      1.30 cm 0.72 cm/m  RA Area:     13.20 cm LA Vol (A2C): 16.7 ml 9.27 ml/m  RA Volume:   29.70 ml  16.49 ml/m LA Vol (A4C): 20.9 ml 11.60 ml/m  AORTIC VALVE AV Area (Vmax):    2.53 cm AV Area (Vmean):   2.36 cm AV Area (VTI):     2.60 cm AV Vmax:           89.20 cm/s AV Vmean:          66.800 cm/s AV VTI:            0.168 m AV Peak Grad:      3.2 mmHg AV Mean Grad:      2.0 mmHg LVOT Vmax:         71.90 cm/s LVOT Vmean:        50.200 cm/s LVOT VTI:          0.139 m LVOT/AV VTI ratio:  0.83  AORTA Ao Root diam: 4.10 cm Ao Asc diam:  3.90 cm TRICUSPID VALVE TR Peak  grad:   31.1 mmHg TR Vmax:        279.00 cm/s  SHUNTS Systemic VTI:  0.14 m Systemic Diam: 2.00 cm Loralie Champagne MD Electronically signed by Loralie Champagne MD Signature Date/Time: 01/26/2021/6:32:34 PM    Final    VAS US CAROTID  Result Date: 01/26/2021 Carotid Arterial Duplex Study Indications:       Syncope. Comparison Study:  no prior Performing Technologist: Abram Sander RVS  Examination Guidelines: A complete evaluation includes B-mode imaging, spectral Doppler, color Doppler, and power Doppler as needed of all accessible portions of each vessel. Bilateral testing is considered an integral part of a complete examination. Limited examinations for reoccurring indications may be performed as noted.  Right Carotid Findings: +----------+--------+--------+--------+------------------+--------+           PSV cm/sEDV cm/sStenosisPlaque DescriptionComments +----------+--------+--------+--------+------------------+--------+ CCA Prox  66      9               heterogenous               +----------+--------+--------+--------+------------------+--------+ CCA Distal75      14              heterogenous               +----------+--------+--------+--------+------------------+--------+ ICA Prox  51      13      1-39%   heterogenous               +----------+--------+--------+--------+------------------+--------+ ICA Distal55      12                                         +----------+--------+--------+--------+------------------+--------+ ECA       75      7                                          +----------+--------+--------+--------+------------------+--------+ +----------+--------+-------+--------+-------------------+           PSV cm/sEDV cmsDescribeArm Pressure (mmHG) +----------+--------+-------+--------+-------------------+ [id number]                                        +----------+--------+-------+--------+-------------------+  +---------+--------+--+--------+-+---------+ VertebralPSV cm/s29EDV cm/s9Antegrade +---------+--------+--+--------+-+---------+  Left Carotid Findings: +----------+--------+--------+--------+------------------+--------+           PSV cm/sEDV cm/sStenosisPlaque DescriptionComments +----------+--------+--------+--------+------------------+--------+ CCA Prox  56      11              heterogenous               +----------+--------+--------+--------+------------------+--------+ CCA Distal85      15              heterogenous               +----------+--------+--------+--------+------------------+--------+ ICA Prox  52      13      1-39%   heterogenous               +----------+--------+--------+--------+------------------+--------+ ICA Distal58      13                                         +----------+--------+--------+--------+------------------+--------+  ECA       91      13                                         +----------+--------+--------+--------+------------------+--------+ +----------+--------+--------+--------+-------------------+           PSV cm/sEDV cm/sDescribeArm Pressure (mmHG) +----------+--------+--------+--------+-------------------+ [id number]                                          +----------+--------+--------+--------+-------------------+ +---------+--------+--------+--------------+ VertebralPSV cm/sEDV cm/sNot identified +---------+--------+--------+--------------+   Summary: Right Carotid: Velocities in the right ICA are consistent with a 1-39% stenosis. Left Carotid: Velocities in the left ICA are consistent with a 1-39% stenosis. Vertebrals: Right vertebral artery demonstrates antegrade flow. Left vertebral             artery was not visualized. *See table(s) above for measurements and observations.  Electronically signed by Servando Snare MD on 01/26/2021 at 4:43:41 PM.    Final     Microbiology: Recent Results (from the  past 240 hour(s))  Culture, blood (routine x 2)     Status: None (Preliminary result)   Collection Time: 02/06/21  9:12 AM   Specimen: BLOOD LEFT ARM  Result Value Ref Range Status   Specimen Description BLOOD LEFT ARM  Final   Special Requests   Final    BOTTLES DRAWN AEROBIC ONLY Blood Culture adequate volume   Culture   Final    NO GROWTH 2 DAYS Performed at [facility], [address], [place], [place] [address]    Report Status PENDING  Incomplete  Culture, blood (routine x 2)     Status: None (Preliminary result)   Collection Time: 02/06/21  9:12 AM   Specimen: BLOOD RIGHT HAND  Result Value Ref Range Status   Specimen Description BLOOD RIGHT HAND  Final   Special Requests   Final    BOTTLES DRAWN AEROBIC ONLY Blood Culture adequate volume   Culture   Final    NO GROWTH 2 DAYS Performed at [facility], [place] [address], [place], [place] [address]    Report Status PENDING  Incomplete     Labs: CBC: Recent Labs  Lab 02/02/21 0402 02/04/21 0302 02/06/21 0912 02/07/21 0429  WBC 6.8 11.5* 13.7* 10.8*  HGB 10.6* 10.9* 11.9* 10.6*  HCT 32.9* 32.5* 36.5* 33.0*  MCV 94.0 93.4 93.8 94.0  PLT 247 318 439* 562*   Basic Metabolic Panel: Recent Labs  Lab 02/02/21 0402 02/04/21 0302 02/06/21 0912 02/07/21 0429  NA 132* 133* 133* 133*  K 3.9 4.3 4.2 3.8  CL 99 98 98 98  CO2 25 25 24 25   GLUCOSE 116* 122* 112* 91  BUN 14 16 15 17   CREATININE 0.65 0.70 0.64 0.62  CALCIUM 8.1* 8.2* 8.2* 8.1*   Liver Function Tests: Recent Labs  Lab 02/04/21 0302 02/07/21 0429  AST 45* 33  ALT 42 34  ALKPHOS 75 90  BILITOT 1.0 0.9  PROT 5.0* 5.1*  ALBUMIN 1.9* 1.9*   CBG: No results for input(s): GLUCAP in the last 168 hours.  Time spent: 35 minutes  Signed:  Berle Mull  [facility]  02/08/2021 12:14 PM

## 2021-02-08 NOTE — Progress Notes (Signed)
PT.  Discharged to [facility] place. Updated Wife (JUDY). Eye Glasses and eyedrops sent with the pt.

## 2021-02-09 ENCOUNTER — Encounter: Payer: Self-pay | Admitting: Neurology

## 2021-02-11 LAB — CULTURE, BLOOD (ROUTINE X 2)
Culture: NO GROWTH
Culture: NO GROWTH
Special Requests: ADEQUATE
Special Requests: ADEQUATE

## 2021-02-28 DEATH — deceased

## 2021-03-28 ENCOUNTER — Ambulatory Visit: Payer: Medicare Other | Admitting: Cardiology

## 2022-05-09 IMAGING — CT CT CERVICAL SPINE W/O CM
2 series · 9 of 27 positions shown, 11 images · non-contrast
Comparison: MRI head 09/17/2020, CT neck 04/01/2017

CLINICAL DATA: Fall, head injury, neck injury, chronic
anticoagulation

EXAM:
CT HEAD WITHOUT CONTRAST
CT CERVICAL SPINE WITHOUT CONTRAST
TECHNIQUE: Multidetector CT imaging of the head and cervical spine was
performed following the standard protocol without intravenous
contrast. Multiplanar CT image reconstructions of the cervical spine
were also generated.

[Series 4: c_spine 2.0 st · axial · 0.38mm/px · z∈[+1240,+1390]mm · 4 of 109 slices shown, 5 images]
[im 17/109  soft-tissue]
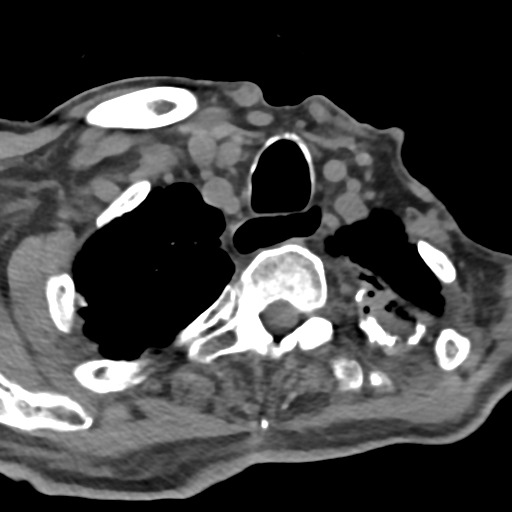
[im 17/109  bone]
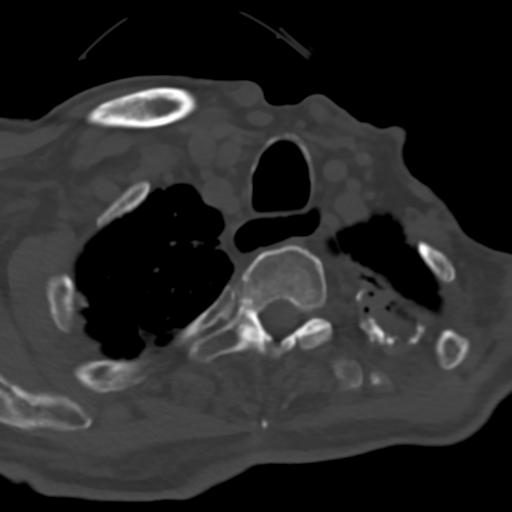
[im 42/109  bone]
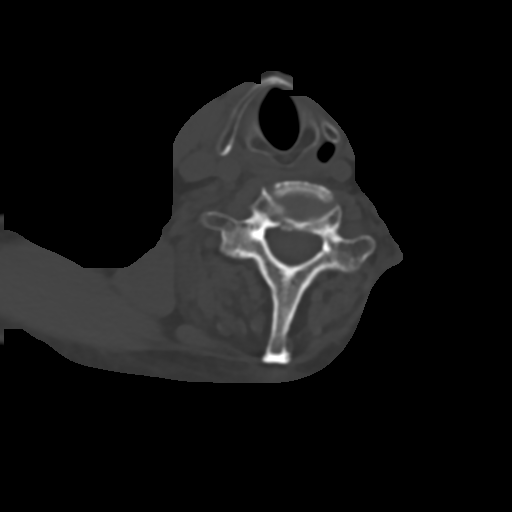
[im 67/109  bone]
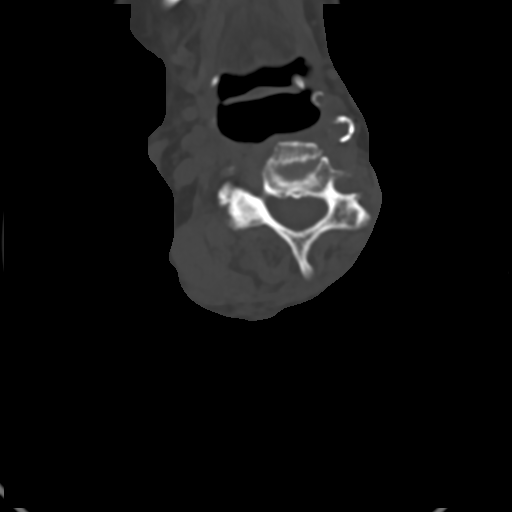
[im 92/109  bone]
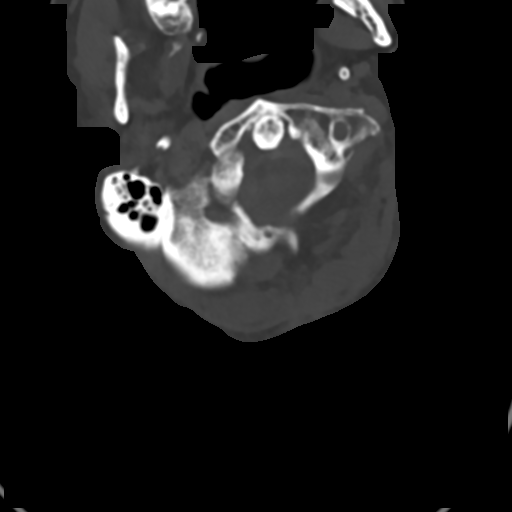

[Series 6: c_spine 2.0 sag bone · sagittal · 0.25mm/px · 5 of 58 slices shown, 6 images]
[im 20/58  bone]
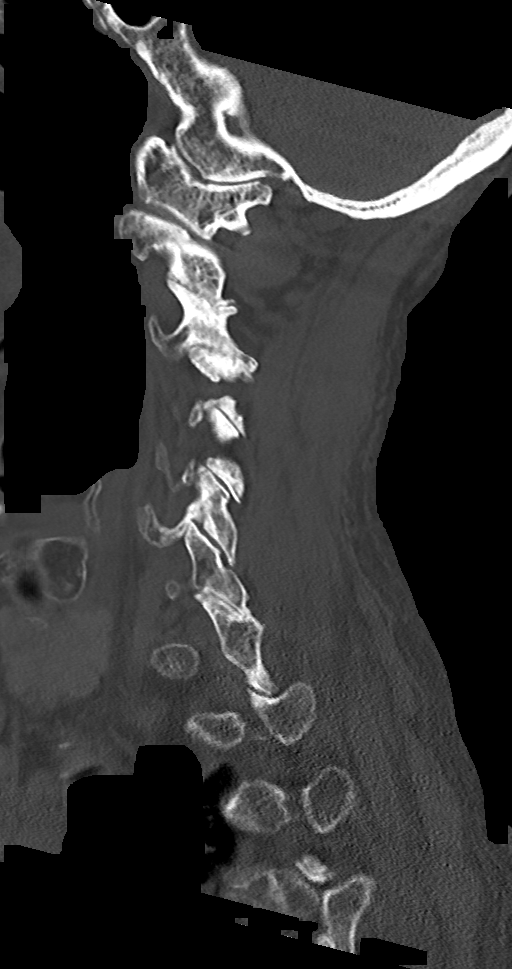
[im 24/58  bone]
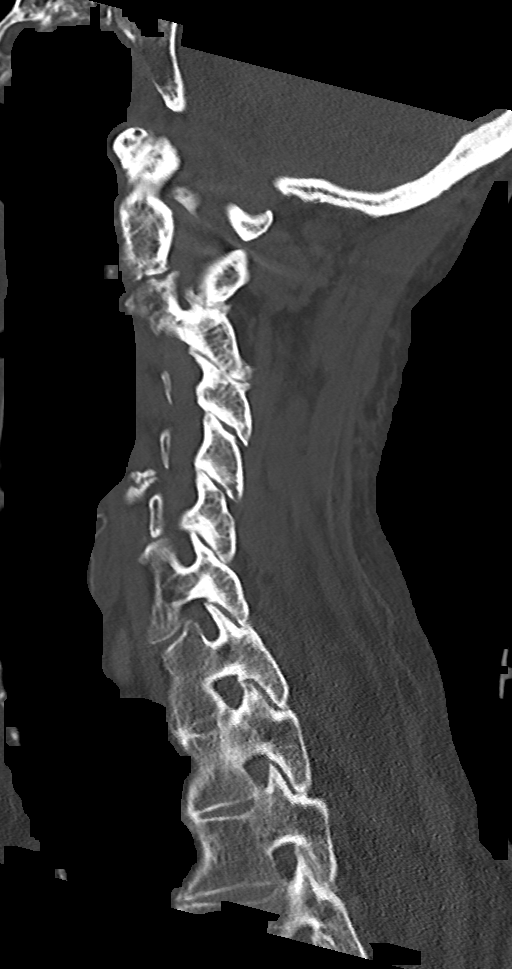
[im 29/58  soft-tissue]
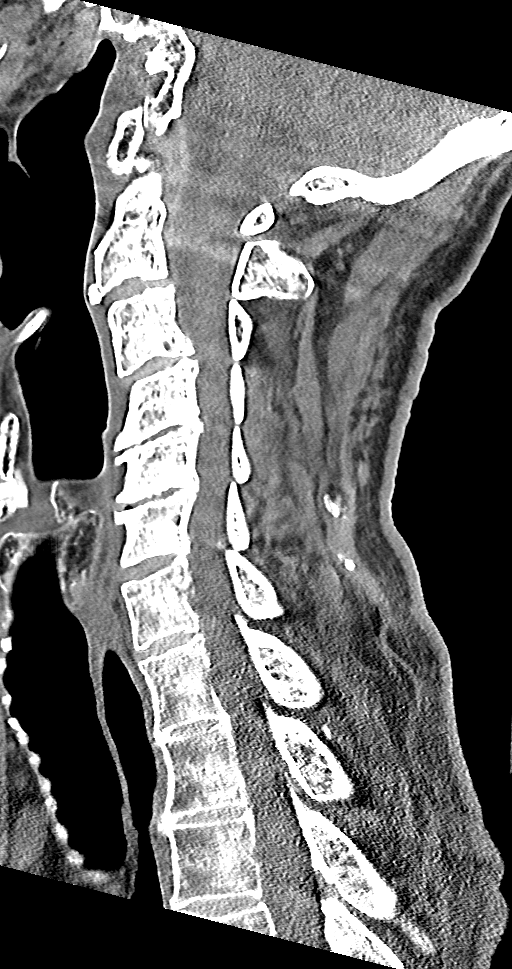
[im 29/58  bone]
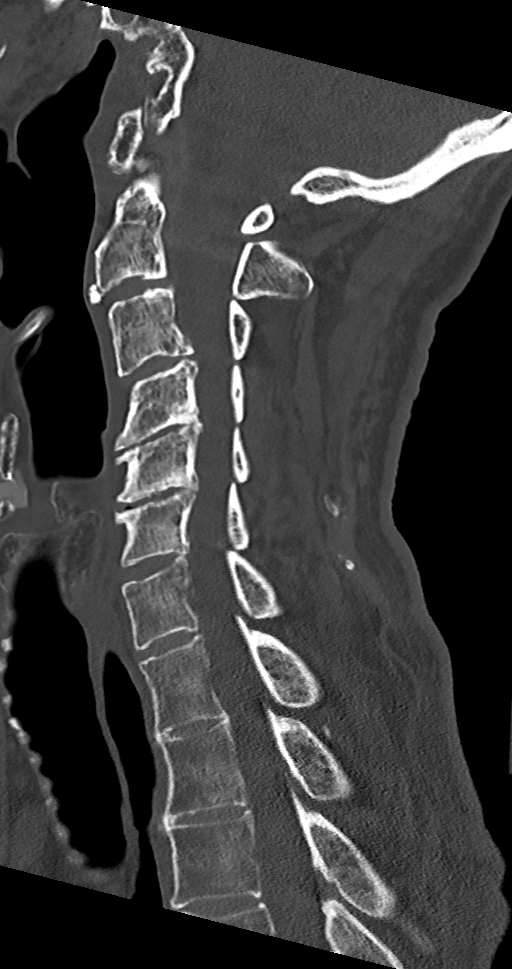
[im 34/58  bone]
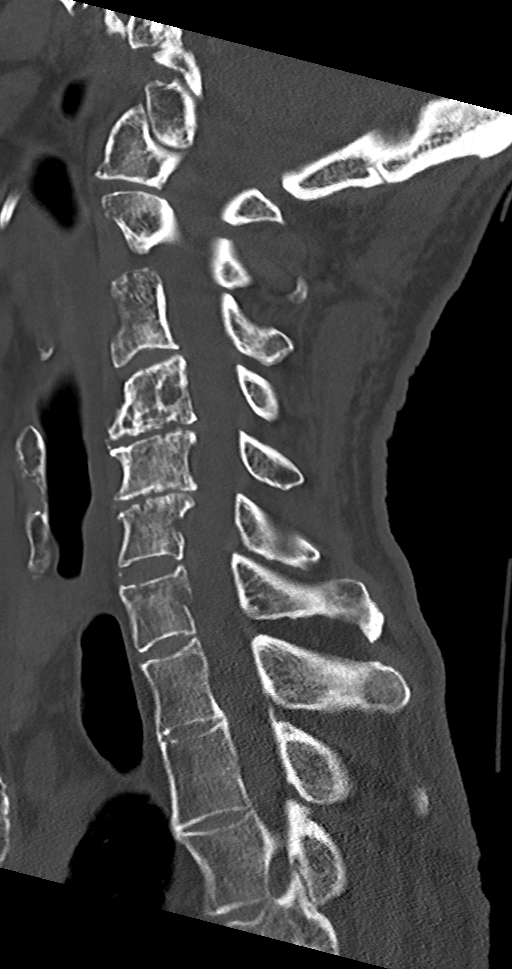
[im 39/58  bone]
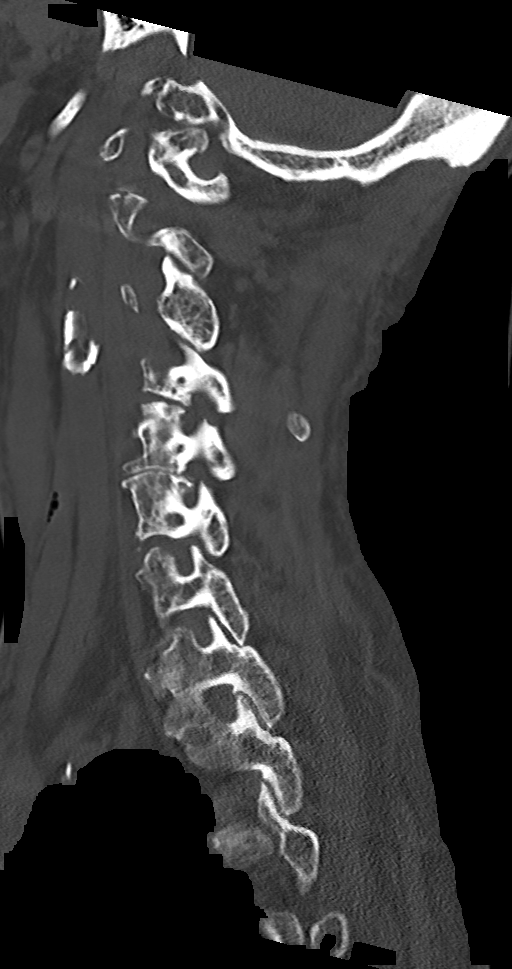

[9 of 27 positions shown; findings below may reference images not displayed]

FINDINGS: CT HEAD FINDINGS

Brain: There is normal anatomic configuration of the brain. A small
right subdural hematoma is seen along the right parietal convexity
measuring 8-9 mm in thickness on coronal image # 47 and
demonstrating mild mass effect upon the right cerebral hemisphere.
There is no associated midline shift. Additionally, there is a small
focus of subarachnoid hemorrhage seen within the sulcus of the
hippocampal gyrus, best noted on coronal image # 43.

No acute infarct. Cerebellum unremarkable. Ventricular size is
normal. No abnormal intra or extra-axial mass lesion. Previously
noted hyperdensity within the pons and inferior left temporal lobe
have resolved in the interval since the prior examination in keeping
with resolved hemorrhage.

Vascular: No asymmetric hyperdense vasculature at the skull base.

Skull: The calvarium is intact

Sinuses/Orbits: Mild mucosal thickening within the a left maxillary
sinus and frontal sinuses with opacification of several left ethmoid
air cells. The orbits are unremarkable

Other: Small right occipital scalp hematoma with punctate focus of
subcutaneous gas in keeping with direct trauma. There is fluid
opacification of several inferior left mastoid air cells with
coalescence of the air cells in keeping with changes of chronic
mastoiditis. Middle ear cavities and right mastoid air cells are
clear.

CT CERVICAL SPINE FINDINGS

Alignment: There is reversal of the normal cervical lordosis from
C2-C6 likely degenerative in nature. No listhesis.

Skull base and vertebrae: The craniocervical junction is
unremarkable. The atlantodental interval is normal. No acute
fracture of the cervical spine.

Soft tissues and spinal canal: Posterior disc osteophyte complex at
C4-5 results in moderate to severe central canal stenosis with an AP
diameter of the spinal canal of 6-7 mm and resultant flattening of
the thecal sac. Posterior disc osteophyte complex ease at C3-4 and
C5-6 results in mild central canal stenosis. No canal hematoma. No
prevertebral soft tissue swelling. No paraspinal fluid collections
are identified.

Disc levels: Review of the sagittal images demonstrates
intervertebral disc space narrowing and endplate remodeling
throughout the cervical spine, most severe at C4-C6 in keeping with
changes of severe degenerative disc disease. Vertebral body height
has been preserved. Review of the axial images demonstrates
multilevel uncovertebral and facet arthrosis resulting in multilevel
moderate to severe neural foraminal narrowing, most severe on the
right at C2-3, C3-4, C4-5, and C5-6 and on the left at C5-6.

Upper chest: Biapical scarring and left apical calcified pleural
plaques are identified.

Other: Thyroid unremarkable
IMPRESSION: Acute intracranial hemorrhage with small right parietal subdural
hematoma demonstrating mild mass effect upon the right cerebral
hemisphere. No midline shift. Additionally, mild subarachnoid
hemorrhage is seen involving the medial right temporal lobe.

Previously noted foci of hyperattenuation involving the pons and
left temporal lobe have resolved in keeping with hemorrhage at that
time.

Fluid opacification of the inferior left mastoid air cells with
coalescence of the air cells in keeping with chronic left
mastoiditis.

Mild paranasal sinus disease.

No acute fracture or listhesis of the cervical spine.

Degenerative disc and degenerative joint disease resulting in
moderate to severe central canal stenosis at C4-5 and multilevel
neural foraminal narrowing as described above.

These results were called by telephone at the time of interpretation
on 01/26/2021 at [DATE] to provider LYNNE OS , who verbally
acknowledged these results.
